# Patient Record
Sex: Female | Born: 1945 | Race: White | Hispanic: No | Smoking: Never smoker
Health system: Southern US, Community
[De-identification: ages and names within clinical notes are randomized; demographics above are authoritative.]

## PROBLEM LIST (undated history)

## (undated) DIAGNOSIS — T7840XA Allergy, unspecified, initial encounter: Secondary | ICD-10-CM

## (undated) DIAGNOSIS — F419 Anxiety disorder, unspecified: Secondary | ICD-10-CM

## (undated) DIAGNOSIS — M1990 Unspecified osteoarthritis, unspecified site: Secondary | ICD-10-CM

## (undated) DIAGNOSIS — E785 Hyperlipidemia, unspecified: Secondary | ICD-10-CM

## (undated) DIAGNOSIS — I10 Essential (primary) hypertension: Secondary | ICD-10-CM

## (undated) DIAGNOSIS — K219 Gastro-esophageal reflux disease without esophagitis: Secondary | ICD-10-CM

## (undated) DIAGNOSIS — M810 Age-related osteoporosis without current pathological fracture: Secondary | ICD-10-CM

## (undated) DIAGNOSIS — H269 Unspecified cataract: Secondary | ICD-10-CM

## (undated) HISTORY — DX: Age-related osteoporosis without current pathological fracture: M81.0

## (undated) HISTORY — DX: Hyperlipidemia, unspecified: E78.5

## (undated) HISTORY — DX: Gastro-esophageal reflux disease without esophagitis: K21.9

## (undated) HISTORY — DX: Essential (primary) hypertension: I10

## (undated) HISTORY — DX: Unspecified cataract: H26.9

## (undated) HISTORY — PX: COLONOSCOPY: SHX174

## (undated) HISTORY — PX: ABDOMINAL HYSTERECTOMY: SHX81

## (undated) HISTORY — PX: EYE SURGERY: SHX253

## (undated) HISTORY — DX: Anxiety disorder, unspecified: F41.9

## (undated) HISTORY — DX: Allergy, unspecified, initial encounter: T78.40XA

## (undated) HISTORY — DX: Unspecified osteoarthritis, unspecified site: M19.90

---

## 1998-01-21 ENCOUNTER — Other Ambulatory Visit: Admission: RE | Admit: 1998-01-21 | Discharge: 1998-01-21 | Payer: Self-pay | Admitting: Family Medicine

## 1998-10-27 ENCOUNTER — Other Ambulatory Visit: Admission: RE | Admit: 1998-10-27 | Discharge: 1998-10-27 | Payer: Self-pay | Admitting: Family Medicine

## 1999-05-08 ENCOUNTER — Encounter: Admission: RE | Admit: 1999-05-08 | Discharge: 1999-05-08 | Payer: Self-pay | Admitting: Family Medicine

## 1999-05-08 ENCOUNTER — Encounter: Payer: Self-pay | Admitting: Family Medicine

## 2000-01-06 ENCOUNTER — Other Ambulatory Visit: Admission: RE | Admit: 2000-01-06 | Discharge: 2000-01-06 | Payer: Self-pay | Admitting: Family Medicine

## 2000-05-13 ENCOUNTER — Encounter: Payer: Self-pay | Admitting: Family Medicine

## 2000-05-13 ENCOUNTER — Encounter: Admission: RE | Admit: 2000-05-13 | Discharge: 2000-05-13 | Payer: Self-pay | Admitting: Family Medicine

## 2001-01-11 ENCOUNTER — Other Ambulatory Visit: Admission: RE | Admit: 2001-01-11 | Discharge: 2001-01-11 | Payer: Self-pay | Admitting: Family Medicine

## 2002-01-31 ENCOUNTER — Other Ambulatory Visit: Admission: RE | Admit: 2002-01-31 | Discharge: 2002-01-31 | Payer: Self-pay | Admitting: Family Medicine

## 2003-04-25 ENCOUNTER — Other Ambulatory Visit: Admission: RE | Admit: 2003-04-25 | Discharge: 2003-04-25 | Payer: Self-pay | Admitting: Family Medicine

## 2004-05-12 ENCOUNTER — Ambulatory Visit: Payer: Self-pay | Admitting: Gastroenterology

## 2004-05-20 ENCOUNTER — Ambulatory Visit: Payer: Self-pay | Admitting: Internal Medicine

## 2004-05-20 ENCOUNTER — Ambulatory Visit (HOSPITAL_COMMUNITY): Admission: RE | Admit: 2004-05-20 | Discharge: 2004-05-20 | Payer: Self-pay | Admitting: Internal Medicine

## 2005-05-11 ENCOUNTER — Other Ambulatory Visit: Admission: RE | Admit: 2005-05-11 | Discharge: 2005-05-11 | Payer: Self-pay | Admitting: Family Medicine

## 2006-07-22 ENCOUNTER — Other Ambulatory Visit: Admission: RE | Admit: 2006-07-22 | Discharge: 2006-07-22 | Payer: Self-pay | Admitting: Family Medicine

## 2007-03-24 ENCOUNTER — Encounter: Admission: RE | Admit: 2007-03-24 | Discharge: 2007-03-24 | Payer: Self-pay | Admitting: Family Medicine

## 2007-08-31 ENCOUNTER — Ambulatory Visit (HOSPITAL_COMMUNITY): Admission: RE | Admit: 2007-08-31 | Discharge: 2007-08-31 | Payer: Self-pay | Admitting: Obstetrics and Gynecology

## 2007-08-31 ENCOUNTER — Encounter (INDEPENDENT_AMBULATORY_CARE_PROVIDER_SITE_OTHER): Payer: Self-pay | Admitting: Obstetrics and Gynecology

## 2007-10-23 ENCOUNTER — Encounter: Payer: Self-pay | Admitting: Internal Medicine

## 2007-10-23 ENCOUNTER — Ambulatory Visit: Payer: Self-pay | Admitting: Internal Medicine

## 2007-10-23 DIAGNOSIS — K625 Hemorrhage of anus and rectum: Secondary | ICD-10-CM

## 2007-11-03 ENCOUNTER — Ambulatory Visit: Payer: Self-pay | Admitting: Internal Medicine

## 2010-11-03 NOTE — Op Note (Signed)
NAME:  Courtney Johnson, Courtney Johnson             ACCOUNT NO.:  [id number]   MEDICAL RECORD NO.:  [id number]          PATIENT TYPE:  AMB   LOCATION:  SDC                           FACILITY:  [facility]   PHYSICIAN:  Guy Sandifer. Henderson Cloud, M.D. DATE OF BIRTH:  1945-08-11   DATE OF PROCEDURE:  08/31/2007  DATE OF DISCHARGE:                               OPERATIVE REPORT   PREOPERATIVE DIAGNOSIS:  Post menopausal bleeding.   POSTOPERATIVE DIAGNOSIS:  Post menopausal bleeding.   PROCEDURE:  Hysteroscopy with resectoscope, dilatation and curettage,  and 1% Xylocaine paracervical block.   SURGEON:  Guy Sandifer. Henderson Cloud, M.D.   ANESTHESIA:  MAC.   SPECIMENS:  1. Endometrial resection.  2. Endometrial curettings, both to pathology.   ESTIMATED BLOOD LOSS:  Minimal.   INPUT AND OUTPUT OF DISTENDING MEDIUM:  90 mL deficit.   INDICATIONS AND CONSENT:  This patient is a 65 year old married white  female G4, P3, with post menopausal bleeding.  Details are dictated in  the history and physical.  Hysteroscopy, resectoscope, dilatation and  curettage have been discussed preoperatively.  The potential risks and  complications have been reviewed including but not limited to infection,  uterine perforation, organ damage, bleeding requiring transfusion of  blood products with possible HIV and hepatitis acquisition, DVT, PE, and  pneumonia.  All questions have been answered and consent signed on the  chart.   FINDINGS:  There is a broad based 8 mm mass arising from the middle of  the upper posterior endometrial canal.  Fallopian tube ostia are noted  bilaterally.   PROCEDURE IN DETAIL:  The patient was taken to the operating room where  she was identified, placed in the dorsal supine position, and she is  given sedation.  She is then placed in the dorsal lithotomy position  where she is gently prepped, the bladder straight catheterized, and  draped in a sterile fashion.  A bivalve speculum was placed in the  vagina and  the anterior cervical lip was injected with 1% Xylocaine and  grasped with a single tooth tenaculum.  A paracervical block was placed  at the 2, 4, 5, 7, 8, and 10 o'clock positions with approximately 20 mL  total of 1% plain Xylocaine.  The cervix was gently progressively  dilated.  The diagnostic hysteroscope was placed in the endocervical  canal and passed under direct visualization under distending media.  The  above findings were noted.  The diagnostic hysteroscope is removed and  the resectoscope with a single right angle wire loop is placed without  difficulty, again advancing under direct visualization with distending  media.  The above described mass was resected in a simple fashion.  Good  hemostasis is maintained.  Resection is carried down to but not to  through the  myometrium.  The hysteroscope is removed and sharp curettage is carried  out.  Scant curettings were noted.  Good hemostasis is noted. All  instruments are removed.  All counts were correct.  The patient is  awakened and taken to the recovery room in stable condition.      Guy Sandifer Henderson Cloud, M.D.  Electronically Signed     JET/MEDQ  D:  08/31/2007  T:  08/31/2007  Job:  [id number]

## 2010-11-03 NOTE — H&P (Signed)
NAME:  Courtney Johnson, Courtney Johnson             ACCOUNT NO.:  [id number]   MEDICAL RECORD NO.:  [id number]          PATIENT TYPE:  AMB   LOCATION:                                FACILITY:  [facility]   PHYSICIAN:  Guy Sandifer. Henderson Cloud, M.D. DATE OF BIRTH:  Mar 17, 1946   DATE OF ADMISSION:  08/31/2007  DATE OF DISCHARGE:                              HISTORY & PHYSICAL   CHIEF COMPLAINT:  Postmenopausal bleeding.   HISTORY OF PRESENT ILLNESS:  This patient is a 65 year old, married,  white female G4, P3 with a last menstrual period at approximately 74-61  years of age.  Over the past several months, she has had intermittent  bright red blood on the toilet tissue.  She has been evaluated by  urology with a reportedly negative CT scan and cystoscopy.  No changes  in bowel habits. Pap smear on June 26, 2007 is benign.  On initial  examination in my office on June 23, 2007, an endocervical polyp was  removed. Pathology is benign.  A subsequent ultrasound on July 25, 2007 was consistent with a uterus measuring 6.3 x 3.0 x 4.2 cm.  Sonohysterogram was consistent with a 7 mm polypoid type mass in the  endometrial cavity.  The patient is being admitted for hysteroscopy with  resectoscope, dilatation and curettage.  The potential risks and  complications have been discussed with the patient preoperatively.   PAST MEDICAL HISTORY:  Positive for hemorrhoids and diverticulitis,  yeast infections   PAST SURGICAL HISTORY:  Tubal ligation 30 years ago.   OBSTETRICAL HISTORY:  Vaginal delivery x3, miscarriage x1.   FAMILY HISTORY:  Positive for diabetes in daughter, colon cancer in  sister.  Coronary artery disease in parents, thyroid disorder in sister.   MEDICATIONS:  None.   ALLERGIES:  No known drug allergies.   SOCIAL HISTORY:  Denies tobacco, alcohol or drug abuse.   REVIEW OF SYSTEMS:  NEURO:  Denies headache. CARDIAC:  Denies chest  pain. PULMONARY:  Denies shortness of breath.   PHYSICAL  EXAMINATION:  HEENT:  Without thyromegaly.  LUNGS:  Clear to auscultation.  HEART:  Regular rate and rhythm.  BACK:  Without CVA tenderness.  BREASTS:  Without mass, traction or discharge.  ABDOMEN:  Soft, nontender without masses.  PELVIC:  Vulva, vagina and cervix without lesion. The uterus is normal  size, mobile, nontender. Adnexa is nontender without masses.  EXTREMITIES:  Grossly within normal limits.  NEUROLOGIC:  Grossly within normal limits.   ASSESSMENT:  Postmenopausal bleeding.   PLAN:  Hysteroscopy with dilatation and curettage.      Guy Sandifer Henderson Cloud, M.D.  Electronically Signed     JET/MEDQ  D:  08/27/2007  T:  08/28/2007  Job:  [id number]

## 2011-03-15 LAB — CBC
HCT: 40.3
Hemoglobin: 14.2
MCHC: 35.2
MCV: 89.7
Platelets: 298
RBC: 4.5
RDW: 12.5
WBC: 4.1

## 2011-03-15 LAB — COMPREHENSIVE METABOLIC PANEL
ALT: 16
Albumin: 4.3
Alkaline Phosphatase: 65
Chloride: 105
Potassium: 3.5
Sodium: 141
Total Bilirubin: 0.8
Total Protein: 7.5

## 2012-09-06 ENCOUNTER — Other Ambulatory Visit: Payer: Self-pay | Admitting: Nurse Practitioner

## 2012-09-07 ENCOUNTER — Other Ambulatory Visit: Payer: Self-pay | Admitting: *Deleted

## 2012-09-07 MED ORDER — SIMVASTATIN 20 MG PO TABS: 20.0000 mg | ORAL_TABLET | Freq: Every day | ORAL | Status: DC

## 2012-10-07 ENCOUNTER — Other Ambulatory Visit: Payer: Self-pay | Admitting: Nurse Practitioner

## 2012-10-09 NOTE — Telephone Encounter (Signed)
LAST OV 10/13. LAST LABS 8/13

## 2012-12-05 ENCOUNTER — Encounter: Payer: Self-pay | Admitting: Internal Medicine

## 2012-12-10 ENCOUNTER — Other Ambulatory Visit: Payer: Self-pay | Admitting: Nurse Practitioner

## 2012-12-12 NOTE — Telephone Encounter (Signed)
Last labs on 02/16/12

## 2013-01-13 ENCOUNTER — Other Ambulatory Visit: Payer: Self-pay | Admitting: Nurse Practitioner

## 2013-01-16 NOTE — Telephone Encounter (Signed)
Medication refill denied- patient needs to be seen for lab work

## 2013-01-16 NOTE — Telephone Encounter (Signed)
Last lipids 08/13

## 2013-01-16 NOTE — Telephone Encounter (Signed)
Patient husband aware that she needs appt to refill rx

## 2013-01-19 ENCOUNTER — Telehealth: Payer: Self-pay | Admitting: Nurse Practitioner

## 2013-01-19 NOTE — Telephone Encounter (Signed)
Appt given for pap/physical exam and refill meds. Per last pap she is not due until next year. May just do labs and refill meds

## 2013-01-24 ENCOUNTER — Encounter: Payer: Self-pay | Admitting: Nurse Practitioner

## 2013-01-24 ENCOUNTER — Ambulatory Visit (INDEPENDENT_AMBULATORY_CARE_PROVIDER_SITE_OTHER): Payer: Medicare Other | Admitting: Nurse Practitioner

## 2013-01-24 VITALS — BP 159/99 | HR 81 | Temp 98.1°F | Ht 63.0 in | Wt 128.0 lb

## 2013-01-24 DIAGNOSIS — E785 Hyperlipidemia, unspecified: Secondary | ICD-10-CM

## 2013-01-24 DIAGNOSIS — Z124 Encounter for screening for malignant neoplasm of cervix: Secondary | ICD-10-CM

## 2013-01-24 DIAGNOSIS — Z01419 Encounter for gynecological examination (general) (routine) without abnormal findings: Secondary | ICD-10-CM

## 2013-01-24 DIAGNOSIS — F411 Generalized anxiety disorder: Secondary | ICD-10-CM

## 2013-01-24 DIAGNOSIS — Z0000 Encounter for general adult medical examination without abnormal findings: Secondary | ICD-10-CM

## 2013-01-24 LAB — POCT URINALYSIS DIPSTICK
Protein, UA: NEGATIVE
Urobilinogen, UA: NEGATIVE
pH, UA: 7

## 2013-01-24 LAB — POCT CBC
HCT, POC: 40.9 % (ref 37.7–47.9)
Hemoglobin: 13.6 g/dL (ref 12.2–16.2)
MCHC: 33.4 g/dL (ref 31.8–35.4)
MCV: 89.5 fL (ref 80–97)
POC Granulocyte: 2.9 (ref 2–6.9)

## 2013-01-24 LAB — POCT UA - MICROSCOPIC ONLY
Casts, Ur, LPF, POC: NEGATIVE
Crystals, Ur, HPF, POC: NEGATIVE
Yeast, UA: NEGATIVE

## 2013-01-24 MED ORDER — SIMVASTATIN 20 MG PO TABS: 20.0000 mg | ORAL_TABLET | Freq: Every day | ORAL | Status: DC

## 2013-01-24 MED ORDER — ALPRAZOLAM 0.25 MG PO TABS: 0.2500 mg | ORAL_TABLET | Freq: Two times a day (BID) | ORAL | Status: DC | PRN

## 2013-01-24 NOTE — Patient Instructions (Signed)

## 2013-01-24 NOTE — Progress Notes (Signed)
Subjective:    Patient ID: Courtney Johnson, female    DOB: 02/06/1946, 67 y.o.   MRN: [id number] Patient here today for CPE and PAP- SHe is doing well Hyperlipidemia This is a chronic problem. The current episode started more than 1 year ago. The problem is controlled. Recent lipid tests were reviewed and are normal. She has no history of diabetes, hypothyroidism or obesity. There are no known factors aggravating her hyperlipidemia. Pertinent negatives include no chest pain, focal sensory loss, leg pain, myalgias or shortness of breath. Current antihyperlipidemic treatment includes statins. The current treatment provides significant improvement of lipids. There are no compliance problems.  Risk factors for coronary artery disease include post-menopausal.       Review of Systems  Respiratory: Negative for shortness of breath.   Cardiovascular: Negative for chest pain.  Musculoskeletal: Negative for myalgias.  All other systems reviewed and are negative.       Objective:   Physical Exam  Constitutional: She is oriented to person, place, and time. She appears well-developed and well-nourished.  HENT:  Head: Normocephalic.  Right Ear: Hearing, tympanic membrane, external ear and ear canal normal.  Left Ear: Hearing, tympanic membrane, external ear and ear canal normal.  Nose: Nose normal.  Mouth/Throat: Uvula is midline and oropharynx is clear and moist.  Eyes: Conjunctivae and EOM are normal. Pupils are equal, round, and reactive to light.  Neck: Normal range of motion and full passive range of motion without pain. Neck supple. No JVD present. Carotid bruit is not present. No mass and no thyromegaly present.  Cardiovascular: Normal rate, normal heart sounds and intact distal pulses.   No murmur heard. Pulmonary/Chest: Effort normal and breath sounds normal. Right breast exhibits no inverted nipple, no mass, no nipple discharge, no skin change and no tenderness. Left breast exhibits no  inverted nipple, no mass, no nipple discharge, no skin change and no tenderness.  Abdominal: Soft. Bowel sounds are normal. She exhibits no mass. There is no tenderness.  Genitourinary: Vagina normal. Guaiac positive stool. No breast swelling, tenderness, discharge or bleeding. No vaginal discharge found.  bimanual exam-No adnexal masses or tenderness.  Vaginal cuff intact  Musculoskeletal: Normal range of motion.  Lymphadenopathy:    She has no cervical adenopathy.  Neurological: She is alert and oriented to person, place, and time.  Skin: Skin is warm and dry.  Psychiatric: She has a normal mood and affect. Her behavior is normal. Judgment and thought content normal.     BP 159/99  Pulse 81  Temp(Src) 98.1 F (36.7 C) (Oral)  Ht 5\' 3"  (1.6 m)  Wt 128 lb (58.06 kg)  BMI 22.68 kg/m2 Results for orders placed in visit on 01/24/13  POCT UA - MICROSCOPIC ONLY      Result Value Range   WBC, Ur, HPF, POC 1-5     RBC, urine, microscopic 5-7     Bacteria, U Microscopic neg     Mucus, UA neg     Epithelial cells, urine per micros occ     Crystals, Ur, HPF, POC neg     Casts, Ur, LPF, POC neg     Yeast, UA neg    POCT URINALYSIS DIPSTICK      Result Value Range   Color, UA yellow     Clarity, UA clear     Glucose, UA neg     Bilirubin, UA neg     Ketones, UA neg     Spec Grav, UA <=1.005  Blood, UA trace     pH, UA 7.0     Protein, UA neg     Urobilinogen, UA negative     Nitrite, UA nrg     Leukocytes, UA Trace          Assessment & Plan:  1. Encounter for routine gynecological examination  - Pap IG (Image Guided)  2. Annual physical exam  - POCT UA - Microscopic Only - POCT urinalysis dipstick - POCT CBC - CMP14+EGFR - Thyroid Panel With TSH  3. Hyperlipidemia Low fat diet and exercise - NMR, lipoprofile - simvastatin (ZOCOR) 20 MG tablet; Take 1 tablet (20 mg total) by mouth at bedtime.  Dispense: 90 tablet; Refill: 1  4. GAD (generalized anxiety  disorder) Stress management - ALPRAZolam (XANAX) 0.25 MG tablet; Take 1 tablet (0.25 mg total) by mouth 2 (two) times daily as needed for sleep.  Dispense: 60 tablet; Refill: 0  [name], FNP

## 2013-01-25 LAB — CMP14+EGFR
ALT: 24 IU/L (ref 0–32)
Albumin/Globulin Ratio: 1.7 (ref 1.1–2.5)
Albumin: 4.4 g/dL (ref 3.6–4.8)
BUN/Creatinine Ratio: 16 (ref 11–26)
GFR calc Af Amer: 105 mL/min/{1.73_m2} (ref >59)
GFR calc non Af Amer: 91 mL/min/{1.73_m2} (ref >59)
Glucose: 96 mg/dL (ref 65–99)
Potassium: 4.3 mmol/L (ref 3.5–5.2)
Total Bilirubin: 0.3 mg/dL (ref 0.0–1.2)
Total Protein: 7 g/dL (ref 6.0–8.5)

## 2013-01-25 LAB — NMR, LIPOPROFILE
Cholesterol: 226 mg/dL — ABNORMAL HIGH (ref <200)
LDL Size: 20.2 nm — ABNORMAL LOW (ref >20.5)
Small LDL Particle Number: 1003 nmol/L — ABNORMAL HIGH (ref <=527)
Triglycerides by NMR: 152 mg/dL — ABNORMAL HIGH (ref <150)

## 2013-01-26 ENCOUNTER — Other Ambulatory Visit: Payer: Self-pay | Admitting: Nurse Practitioner

## 2013-01-26 MED ORDER — SIMVASTATIN 40 MG PO TABS: 40.0000 mg | ORAL_TABLET | Freq: Every day | ORAL | Status: DC

## 2013-01-26 MED ORDER — ATORVASTATIN CALCIUM 40 MG PO TABS: 40.0000 mg | ORAL_TABLET | Freq: Every day | ORAL | Status: DC

## 2013-01-28 LAB — PAP IG (IMAGE GUIDED): PAP Smear Comment: 0

## 2013-02-05 ENCOUNTER — Telehealth: Payer: Self-pay | Admitting: Nurse Practitioner

## 2013-02-05 MED ORDER — NITROFURANTOIN MONOHYD MACRO 100 MG PO CAPS: 100.0000 mg | ORAL_CAPSULE | Freq: Two times a day (BID) | ORAL | Status: DC

## 2013-02-05 NOTE — Telephone Encounter (Signed)
macrobid rx sent to pharmacy 

## 2013-02-05 NOTE — Telephone Encounter (Signed)
Patient aware.

## 2013-02-12 ENCOUNTER — Encounter: Payer: Self-pay | Admitting: Internal Medicine

## 2013-03-28 ENCOUNTER — Encounter (INDEPENDENT_AMBULATORY_CARE_PROVIDER_SITE_OTHER): Payer: Self-pay

## 2013-03-28 ENCOUNTER — Ambulatory Visit (INDEPENDENT_AMBULATORY_CARE_PROVIDER_SITE_OTHER): Payer: Medicare Other

## 2013-03-28 DIAGNOSIS — Z23 Encounter for immunization: Secondary | ICD-10-CM

## 2013-04-16 ENCOUNTER — Ambulatory Visit (AMBULATORY_SURGERY_CENTER): Payer: Self-pay

## 2013-04-16 VITALS — Ht 63.0 in | Wt 125.0 lb

## 2013-04-16 DIAGNOSIS — Z800 Family history of malignant neoplasm of digestive organs: Secondary | ICD-10-CM

## 2013-04-16 MED ORDER — SUPREP BOWEL PREP KIT 17.5-3.13-1.6 GM/177ML PO SOLN: 1.0000 | Freq: Once | ORAL | Status: DC

## 2013-04-26 ENCOUNTER — Encounter: Payer: Self-pay | Admitting: Internal Medicine

## 2013-04-26 ENCOUNTER — Ambulatory Visit (AMBULATORY_SURGERY_CENTER): Payer: Medicare Other | Admitting: Internal Medicine

## 2013-04-26 VITALS — BP 103/66 | HR 68 | Temp 97.9°F | Resp 15 | Ht 63.0 in | Wt 125.0 lb

## 2013-04-26 DIAGNOSIS — Z800 Family history of malignant neoplasm of digestive organs: Secondary | ICD-10-CM | POA: Insufficient documentation

## 2013-04-26 DIAGNOSIS — Z1211 Encounter for screening for malignant neoplasm of colon: Secondary | ICD-10-CM

## 2013-04-26 MED ORDER — SODIUM CHLORIDE 0.9 % IV SOLN: 500.0000 mL | INTRAVENOUS | Status: DC

## 2013-04-26 NOTE — Progress Notes (Signed)
Lidocaine-40mg IV prior to Propofol InductionPropofol given over incremental dosages 

## 2013-04-26 NOTE — Op Note (Signed)
[facility] [address] [place], [address]   COLONOSCOPY PROCEDURE REPORT  PATIENT: Courtney Johnson, Courtney Johnson  MR#: [id number] BIRTHDATE: [birth year] , 66  yrs. old GENDER: Female ENDOSCOPIST: Iva Boop, MD, [facility] PROCEDURE DATE:  04/26/2013 PROCEDURE:   Colonoscopy, screening First Screening Colonoscopy - Avg.  risk and is 50 yrs.  old or older - No.  Prior Negative Screening - Now for repeat screening. Above average risk  History of Adenoma - Now for follow-up colonoscopy & has been > or = to 3 yrs.  N/A  Polyps Removed Today? No.  Recommend repeat exam, <10 yrs? Yes.  High risk (family or personal hx). ASA CLASS:   Class II INDICATIONS:Patient's immediate family history of colon cancer. Sister late 43's MEDICATIONS: propofol (Diprivan) 200mg  IV, MAC sedation, administered by CRNA, and These medications were titrated to patient response per physician's verbal order  DESCRIPTION OF PROCEDURE:   After the risks benefits and alternatives of the procedure were thoroughly explained, informed consent was obtained.  A digital rectal exam revealed no abnormalities of the rectum but she has nal skin tags.   The LB CF-H180AL Loaner [id number]  endoscope was introduced through the anus and advanced to the cecum, which was identified by both the appendix and ileocecal valve. No adverse events experienced.   The quality of the prep was excellent using Suprep  The instrument was then slowly withdrawn as the colon was fully examined.   COLON FINDINGS: Moderate diverticulosis was noted in the sigmoid colon.   The colon mucosa was otherwise normal.   A right colon retroflexion was performed.  Retroflexed views revealed internal hemorrhoids. The time to cecum=3 minutes 57 seconds.  Withdrawal time=8 minutes 27 seconds.  The scope was withdrawn and the procedure completed. COMPLICATIONS: There were no complications.  ENDOSCOPIC IMPRESSION: 1.   Moderate diverticulosis was noted  in the sigmoid colon 2.   The colon mucosa was otherwise normal - excellent prep - family hx CRCA 3.   Internal hemorrhoids 4.   Hemorrhoidal tags  RECOMMENDATIONS: 1.  Repeat Colonoscopy in 5 years. 2.   My office will contact patient to schedule a visit about hemorrhoid problems and gas.  eSigned:  Iva Boop, MD, [facility] 04/26/2013 9:28 AM  cc: The Patient

## 2013-04-26 NOTE — Progress Notes (Signed)
Patient did not experience any of the following events: a burn prior to discharge; a fall within the facility; wrong site/side/patient/procedure/implant event; or a hospital transfer or hospital admission upon discharge from the facility. (G8907) Patient did not have preoperative order for IV antibiotic SSI prophylaxis. (G8918)  

## 2013-04-26 NOTE — Patient Instructions (Addendum)
No polyps seen. You also have a condition called diverticulosis - common and not usually a problem. Please read the handout provided. You have skin tags and hemorrhoids (internal).  Next routine colonoscopy in 5 years - 2019.  My office will make you a follow-up appointment. Please read the hemorrhoid handout.  I appreciate the opportunity to care for you. Iva Boop, MD, FACG  YOU HAD AN ENDOSCOPIC PROCEDURE TODAY AT THE [facility]: Refer to the procedure report that was given to you for any specific questions about what was found during the examination.  If the procedure report does not answer your questions, please call your gastroenterologist to clarify.  If you requested that your care partner not be given the details of your procedure findings, then the procedure report has been included in a sealed envelope for you to review at your convenience later.  YOU SHOULD EXPECT: Some feelings of bloating in the abdomen. Passage of more gas than usual.  Walking can help get rid of the air that was put into your GI tract during the procedure and reduce the bloating. If you had a lower endoscopy (such as a colonoscopy or flexible sigmoidoscopy) you may notice spotting of blood in your stool or on the toilet paper. If you underwent a bowel prep for your procedure, then you may not have a normal bowel movement for a few days.  DIET: Your first meal following the procedure should be a light meal and then it is ok to progress to your normal diet.  A half-sandwich or bowl of soup is an example of a good first meal.  Heavy or fried foods are harder to digest and may make you feel nauseous or bloated.  Likewise meals heavy in dairy and vegetables can cause extra gas to form and this can also increase the bloating.  Drink plenty of fluids but you should avoid alcoholic beverages for 24 hours.  ACTIVITY: Your care partner should take you home directly after the procedure.  You should plan to  take it easy, moving slowly for the rest of the day.  You can resume normal activity the day after the procedure however you should NOT DRIVE or use heavy machinery for 24 hours (because of the sedation medicines used during the test).    SYMPTOMS TO REPORT IMMEDIATELY: A gastroenterologist can be reached at any hour.  During normal business hours, 8:30 AM to 5:00 PM Monday through Friday, call [phone].  After hours and on weekends, please call the GI answering service at [phone] who will take a message and have the physician on call contact you.   Following lower endoscopy (colonoscopy or flexible sigmoidoscopy):  Excessive amounts of blood in the stool  Significant tenderness or worsening of abdominal pains  Swelling of the abdomen that is new, acute  Fever of 100F or higher   FOLLOW UP: If any biopsies were taken you will be contacted by phone or by letter within the next 1-3 weeks.  Call your gastroenterologist if you have not heard about the biopsies in 3 weeks.  Our staff will call the home number listed on your records the next business day following your procedure to check on you and address any questions or concerns that you may have at that time regarding the information given to you following your procedure. This is a courtesy call and so if there is no answer at the home number and we have not heard from you through the  emergency physician on call, we will assume that you have returned to your regular daily activities without incident.  SIGNATURES/CONFIDENTIALITY: You and/or your care partner have signed paperwork which will be entered into your electronic medical record.  These signatures attest to the fact that that the information above on your After Visit Summary has been reviewed and is understood.  Full responsibility of the confidentiality of this discharge information lies with you and/or your care-partner.  Diverticulosis, hemorrhoids-handouts given  Repeat  colonoscopy will be determined by pathology.

## 2013-04-27 ENCOUNTER — Telehealth: Payer: Self-pay | Admitting: *Deleted

## 2013-04-27 NOTE — Telephone Encounter (Signed)
  Follow up Call-  Call back number 04/26/2013  Post procedure Call Back phone  # [phone]  Permission to leave phone message Yes     Patient questions:  Do you have a fever, pain , or abdominal swelling? no Pain Score  0 *  Have you tolerated food without any problems? yes  Have you been able to return to your normal activities? yes  Do you have any questions about your discharge instructions: Diet   no Medications  no Follow up visit  no  Do you have questions or concerns about your Care? no  Actions: * If pain score is 4 or above: No action needed, pain <4.

## 2013-06-13 ENCOUNTER — Ambulatory Visit (INDEPENDENT_AMBULATORY_CARE_PROVIDER_SITE_OTHER): Payer: Medicare Other | Admitting: Family Medicine

## 2013-06-13 ENCOUNTER — Encounter: Payer: Self-pay | Admitting: Family Medicine

## 2013-06-13 ENCOUNTER — Encounter (INDEPENDENT_AMBULATORY_CARE_PROVIDER_SITE_OTHER): Payer: Self-pay

## 2013-06-13 VITALS — BP 144/92 | HR 94 | Temp 99.8°F | Ht 63.0 in | Wt 124.0 lb

## 2013-06-13 DIAGNOSIS — N390 Urinary tract infection, site not specified: Secondary | ICD-10-CM

## 2013-06-13 LAB — POCT URINALYSIS DIPSTICK
Nitrite, UA: NEGATIVE
Urobilinogen, UA: NEGATIVE
pH, UA: 7.5

## 2013-06-13 LAB — POCT UA - MICROSCOPIC ONLY
Casts, Ur, LPF, POC: NEGATIVE
Mucus, UA: NEGATIVE
Yeast, UA: NEGATIVE

## 2013-06-13 MED ORDER — CEPHALEXIN 500 MG PO CAPS: 500.0000 mg | ORAL_CAPSULE | Freq: Three times a day (TID) | ORAL | Status: DC

## 2013-06-13 NOTE — Progress Notes (Signed)
   Subjective:    Patient ID: Courtney Johnson, female    DOB: 1945-08-19, 67 y.o.   MRN: [id number]  HPI DYSURIA Onset:  4-5 days  Description: dysuria, increased urinary frequency  Modifying factors: hx/o recurrent UTI  Symptoms Urgency:  yes Frequency: yes  Hesitancy:  yes Hematuria:  no Flank Pain:  no Fever: no Nausea/Vomiting:  no Missed LMP: no STD exposure: no Discharge: no Irritants: nbo Rash: no  Red Flags   More than 3 UTI's last 12 months:  yes PMH of  Diabetes or Immunosuppression:  no Renal Disease/Calculi: no Urinary Tract Abnormality:  no Instrumentation or Trauma: no      Review of Systems  All other systems reviewed and are negative.       Objective:   Physical Exam  Constitutional: She appears well-developed and well-nourished.  HENT:  Head: Normocephalic and atraumatic.  Eyes: Conjunctivae are normal. Pupils are equal, round, and reactive to light.  Neck: Normal range of motion.  Cardiovascular: Normal rate and regular rhythm.   Pulmonary/Chest: Effort normal and breath sounds normal.  Abdominal: Soft. Bowel sounds are normal.  No flank pain  + suprapubic tenderness   Musculoskeletal: Normal range of motion.  Neurological: She is alert.  Skin: Skin is warm.   Results for orders placed in visit on 06/13/13 (from the past 24 hour(s))  POCT UA - MICROSCOPIC ONLY     Status: None   Collection Time    06/13/13 12:14 PM      Result Value Range   WBC, Ur, HPF, POC occ     RBC, urine, microscopic 1-3     Bacteria, U Microscopic neg     Mucus, UA neg     Epithelial cells, urine per micros occ     Crystals, Ur, HPF, POC neg     Casts, Ur, LPF, POC neg     Yeast, UA neg    POCT URINALYSIS DIPSTICK     Status: None   Collection Time    06/13/13 12:14 PM      Result Value Range   Color, UA clear     Clarity, UA clear     Glucose, UA neg     Bilirubin, UA neg     Ketones, UA neg     Spec Grav, UA <=1.005     Blood, UA trace     pH, UA 7.5     Protein, UA neg     Urobilinogen, UA negative     Nitrite, UA neg     Leukocytes, UA moderate (2+)         Assessment & Plan:  UTI (urinary tract infection) - Plan: POCT UA - Microscopic Only, POCT urinalysis dipstick, Urine culture, cephALEXin (KEFLEX) 500 MG capsule  Will treat with keflex.  Urine culture Discussed infectious and GU red flags  Consider follow up with urology as this has been a recurrent issue.

## 2013-06-18 ENCOUNTER — Other Ambulatory Visit (INDEPENDENT_AMBULATORY_CARE_PROVIDER_SITE_OTHER): Payer: Medicare Other

## 2013-06-18 DIAGNOSIS — E559 Vitamin D deficiency, unspecified: Secondary | ICD-10-CM

## 2013-06-18 DIAGNOSIS — E785 Hyperlipidemia, unspecified: Secondary | ICD-10-CM

## 2013-06-18 NOTE — Progress Notes (Signed)
Pt came in for labs only 

## 2013-06-19 LAB — NMR, LIPOPROFILE
HDL Particle Number: 40.1 umol/L (ref >=30.5)
LDL Size: 20.3 nm — ABNORMAL LOW (ref >20.5)
LDLC SERPL CALC-MCNC: 70 mg/dL (ref <100)
LP-IR Score: 49 — ABNORMAL HIGH (ref <=45)
Small LDL Particle Number: 512 nmol/L (ref <=527)

## 2013-06-19 LAB — CMP14+EGFR
AST: 26 IU/L (ref 0–40)
Alkaline Phosphatase: 63 IU/L (ref 39–117)
BUN/Creatinine Ratio: 10 — ABNORMAL LOW (ref 11–26)
CO2: 23 mmol/L (ref 18–29)
Creatinine, Ser: 0.81 mg/dL (ref 0.57–1.00)
Globulin, Total: 2.3 g/dL (ref 1.5–4.5)
Sodium: 141 mmol/L (ref 134–144)
Total Bilirubin: 0.3 mg/dL (ref 0.0–1.2)

## 2013-06-19 LAB — VITAMIN D 25 HYDROXY (VIT D DEFICIENCY, FRACTURES): Vit D, 25-Hydroxy: 41.9 ng/mL (ref 30.0–100.0)

## 2013-06-23 ENCOUNTER — Ambulatory Visit (INDEPENDENT_AMBULATORY_CARE_PROVIDER_SITE_OTHER): Payer: Medicare HMO | Admitting: Family Medicine

## 2013-06-23 ENCOUNTER — Encounter: Payer: Self-pay | Admitting: Family Medicine

## 2013-06-23 VITALS — BP 149/88 | HR 82 | Temp 98.9°F | Ht 63.0 in | Wt 126.0 lb

## 2013-06-23 DIAGNOSIS — N390 Urinary tract infection, site not specified: Secondary | ICD-10-CM

## 2013-06-23 DIAGNOSIS — E785 Hyperlipidemia, unspecified: Secondary | ICD-10-CM | POA: Insufficient documentation

## 2013-06-23 DIAGNOSIS — R300 Dysuria: Secondary | ICD-10-CM

## 2013-06-23 DIAGNOSIS — F411 Generalized anxiety disorder: Secondary | ICD-10-CM | POA: Insufficient documentation

## 2013-06-23 LAB — POCT URINALYSIS DIPSTICK
BILIRUBIN UA: NEGATIVE
Glucose, UA: NEGATIVE
KETONES UA: NEGATIVE
Nitrite, UA: NEGATIVE
PH UA: 6
Protein, UA: NEGATIVE
Urobilinogen, UA: NEGATIVE

## 2013-06-23 LAB — POCT UA - MICROSCOPIC ONLY
CRYSTALS, UR, HPF, POC: NEGATIVE
Casts, Ur, LPF, POC: NEGATIVE
MUCUS UA: NEGATIVE
WBC, UR, HPF, POC: 5.1
Yeast, UA: NEGATIVE

## 2013-06-23 MED ORDER — PHENAZOPYRIDINE HCL 200 MG PO TABS: 200.0000 mg | ORAL_TABLET | Freq: Three times a day (TID) | ORAL | Status: DC | PRN

## 2013-06-23 MED ORDER — CIPROFLOXACIN HCL 500 MG PO TABS: 500.0000 mg | ORAL_TABLET | Freq: Two times a day (BID) | ORAL | Status: DC

## 2013-06-23 NOTE — Progress Notes (Signed)
Subjective:    Patient ID: Courtney Johnson, female    DOB: Feb 18, 1946, 68 y.o.   MRN: [id number]  HPI 10 day history of pelvic discomfort and dysuria. She finished Keflex 3 days ago. Symptoms improved initially but have not resolved.    Review of Systems  Constitutional: Negative.   HENT: Negative.   Eyes: Negative.   Respiratory: Negative.   Cardiovascular: Negative.   Gastrointestinal: Negative.   Endocrine: Negative.   Genitourinary: Positive for dysuria, urgency, frequency and pelvic pain. Negative for flank pain and difficulty urinating.  Musculoskeletal: Negative.   Skin: Negative.   Allergic/Immunologic: Negative.   Neurological: Negative.   Hematological: Negative.   Psychiatric/Behavioral: Negative.        Objective:   Physical Exam  Constitutional: She is oriented to person, place, and time. She appears well-developed and well-nourished. No distress.  HENT:  Head: Normocephalic.  Eyes: Conjunctivae and EOM are normal. Pupils are equal, round, and reactive to light. Right eye exhibits no discharge. Left eye exhibits no discharge. No scleral icterus.  Neck: Normal range of motion.  Abdominal: Soft. She exhibits no mass. There is tenderness (suprapubic and bilateral flank tenderness). There is no rebound and no guarding.  Musculoskeletal: Normal range of motion.  Neurological: She is alert and oriented to person, place, and time. She has normal reflexes.  Skin: Skin is warm and dry. No rash noted.  Psychiatric: She has a normal mood and affect. Her behavior is normal. Judgment and thought content normal.   Results for orders placed in visit on 06/23/13  POCT UA - MICROSCOPIC ONLY      Result Value Range   WBC, Ur, HPF, POC 5.10     RBC, urine, microscopic 1-5     Bacteria, U Microscopic few     Mucus, UA negative     Epithelial cells, urine per micros few     Crystals, Ur, HPF, POC negative     Casts, Ur, LPF, POC negative     Yeast, UA negative'    POCT  URINALYSIS DIPSTICK      Result Value Range   Color, UA gold     Clarity, UA clear     Glucose, UA negative     Bilirubin, UA negative     Ketones, UA negative     Spec Grav, UA <=1.005     Blood, UA moderate     pH, UA 6.0     Protein, UA negative     Urobilinogen, UA negative     Nitrite, UA negative     Leukocytes, UA small (1+)            Assessment & Plan:   1. Urinary tract infection, site not specifie - POCT UA - Microscopic Only - POCT urinalysis dipstick - Urine culture - ciprofloxacin (CIPRO) 500 MG tablet; Take 1 tablet (500 mg total) by mouth 2 (two) times daily.  Dispense: 20 tablet; Refill: 0 - phenazopyridine (PYRIDIUM) 200 MG tablet; Take 1 tablet (200 mg total) by mouth 3 (three) times daily as needed for pain.  Dispense: 21 tablet; Refill: 1  2. Hyperlipidemia   3. Generalized anxiety disorder   4. Dysuria  - phenazopyridine (PYRIDIUM) 200 MG tablet; Take 1 tablet (200 mg total) by mouth 3 (three) times daily as needed for pain.  Dispense: 21 tablet; Refill: 1  Patient Instructions  Take medication as directed Drink plenty of fluids We will arrange for you a visit with the urologist to further  evaluate the increased frequency of urinary tract infections and symptoms   we will call you with the results of the urine culture once those results are available Nyra Capes MD

## 2013-06-23 NOTE — Addendum Note (Signed)
Addended by: Gwenith DailyHUDY, KRISTEN N on: 06/23/2013 10:07 AM   Modules accepted: Orders

## 2013-06-23 NOTE — Patient Instructions (Signed)
Take medication as directed Drink plenty of fluids We will arrange for you a visit with the urologist to further evaluate the increased frequency of urinary tract infections and symptoms

## 2013-06-26 LAB — URINE CULTURE

## 2013-06-26 LAB — SPECIMEN STATUS REPORT

## 2013-06-29 ENCOUNTER — Telehealth: Payer: Self-pay | Admitting: Family Medicine

## 2013-07-03 ENCOUNTER — Other Ambulatory Visit: Payer: Self-pay | Admitting: *Deleted

## 2013-07-03 ENCOUNTER — Other Ambulatory Visit: Payer: Medicare HMO

## 2013-07-03 DIAGNOSIS — N390 Urinary tract infection, site not specified: Secondary | ICD-10-CM

## 2013-07-03 NOTE — Progress Notes (Signed)
Patient dropped off urine sample

## 2013-07-04 LAB — URINE CULTURE

## 2013-07-05 ENCOUNTER — Telehealth: Payer: Self-pay | Admitting: Family Medicine

## 2013-10-07 ENCOUNTER — Other Ambulatory Visit: Payer: Self-pay | Admitting: Nurse Practitioner

## 2014-01-11 ENCOUNTER — Other Ambulatory Visit: Payer: Self-pay | Admitting: Nurse Practitioner

## 2014-01-30 ENCOUNTER — Encounter: Payer: Self-pay | Admitting: Nurse Practitioner

## 2014-01-30 ENCOUNTER — Ambulatory Visit (INDEPENDENT_AMBULATORY_CARE_PROVIDER_SITE_OTHER): Payer: Medicare HMO | Admitting: Nurse Practitioner

## 2014-01-30 VITALS — BP 147/94 | HR 96 | Temp 97.1°F | Ht 63.0 in | Wt 126.6 lb

## 2014-01-30 DIAGNOSIS — F411 Generalized anxiety disorder: Secondary | ICD-10-CM

## 2014-01-30 DIAGNOSIS — Z0000 Encounter for general adult medical examination without abnormal findings: Secondary | ICD-10-CM

## 2014-01-30 DIAGNOSIS — Z13820 Encounter for screening for osteoporosis: Secondary | ICD-10-CM

## 2014-01-30 DIAGNOSIS — E785 Hyperlipidemia, unspecified: Secondary | ICD-10-CM

## 2014-01-30 LAB — POCT URINALYSIS DIPSTICK
BILIRUBIN UA: NEGATIVE
Glucose, UA: NEGATIVE
Ketones, UA: NEGATIVE
Nitrite, UA: NEGATIVE
Protein, UA: NEGATIVE
UROBILINOGEN UA: NEGATIVE
pH, UA: 6.5

## 2014-01-30 LAB — POCT UA - MICROSCOPIC ONLY
CRYSTALS, UR, HPF, POC: NEGATIVE
Casts, Ur, LPF, POC: NEGATIVE
Mucus, UA: NEGATIVE
Yeast, UA: NEGATIVE

## 2014-01-30 MED ORDER — ALPRAZOLAM 0.25 MG PO TABS: 0.2500 mg | ORAL_TABLET | Freq: Two times a day (BID) | ORAL | Status: DC | PRN

## 2014-01-30 NOTE — Progress Notes (Signed)
Subjective:    Patient ID: Courtney Johnson, female    DOB: [birth year], 68 y.o.   MRN: [id number]  Patient here today for follow up of chronic medical problems.   Hyperlipidemia This is a chronic problem. The current episode started more than 1 year ago. The problem is controlled. Recent lipid tests were reviewed and are normal. She has no history of diabetes, hypothyroidism or obesity. There are no known factors aggravating her hyperlipidemia. Pertinent negatives include no chest pain, focal sensory loss, leg pain, myalgias or shortness of breath. Current antihyperlipidemic treatment includes statins (Stopped takin zocor because she thoughtit was making her so tired.). The current treatment provides significant improvement of lipids. There are no compliance problems.  Risk factors for coronary artery disease include post-menopausal.   GAD Only takes xanax when she is worried and cannot sleep-     * patient c/o dysuria that just started this morning- she has frequent UTI and wants urine checked.   Review of Systems  Respiratory: Negative for shortness of breath.   Cardiovascular: Negative for chest pain.  Musculoskeletal: Negative for myalgias.  All other systems reviewed and are negative.      Objective:   Physical Exam  Constitutional: She is oriented to person, place, and time. She appears well-developed and well-nourished.  HENT:  Head: Normocephalic.  Right Ear: Hearing, tympanic membrane, external ear and ear canal normal.  Left Ear: Hearing, tympanic membrane, external ear and ear canal normal.  Nose: Nose normal.  Mouth/Throat: Uvula is midline and oropharynx is clear and moist.  Eyes: Conjunctivae and EOM are normal. Pupils are equal, round, and reactive to light.  Neck: Trachea normal, normal range of motion and full passive range of motion without pain. Neck supple. No JVD present. Carotid bruit is not present. No mass and no thyromegaly present.  Cardiovascular: Normal  rate, regular rhythm, normal heart sounds and intact distal pulses.  Exam reveals no gallop and no friction rub.   No murmur heard. Pulmonary/Chest: Effort normal and breath sounds normal. Right breast exhibits no inverted nipple, no mass, no nipple discharge, no skin change and no tenderness. Left breast exhibits no inverted nipple, no mass, no nipple discharge, no skin change and no tenderness.  Abdominal: Soft. Bowel sounds are normal. She exhibits no distension and no mass. There is no tenderness.  Genitourinary: No breast swelling, tenderness, discharge or bleeding. No vaginal discharge found.  Musculoskeletal: Normal range of motion.  Lymphadenopathy:    She has no cervical adenopathy.  Neurological: She is alert and oriented to person, place, and time. She has normal reflexes.  Skin: Skin is warm and dry.  Psychiatric: She has a normal mood and affect. Her behavior is normal. Judgment and thought content normal.     BP 147/94  Pulse 96  Temp(Src) 97.1 F (36.2 C) (Oral)  Ht 5' 3"  (1.6 m)  Wt 126 lb 9.6 oz (57.425 kg)  BMI 22.43 kg/m2   Results for orders placed in visit on 01/30/14  POCT URINALYSIS DIPSTICK      Result Value Ref Range   Color, UA gold     Clarity, UA clear     Glucose, UA negative     Bilirubin, UA negative     Ketones, UA negative     Spec Grav, UA <=1.005     Blood, UA trace     pH, UA 6.5     Protein, UA negative     Urobilinogen, UA negative  Nitrite, UA negative     Leukocytes, UA small (1+)    POCT UA - MICROSCOPIC ONLY      Result Value Ref Range   WBC, Ur, HPF, POC 5-8     RBC, urine, microscopic 1-5     Bacteria, U Microscopic few     Mucus, UA negative     Epithelial cells, urine per micros occ     Crystals, Ur, HPF, POC negative     Casts, Ur, LPF, POC negative     Yeast, UA negative         Assessment & Plan:   1. Routine health maintenance   2. Hyperlipidemia   3. Generalized anxiety disorder   4. GAD (generalized anxiety  disorder)   5. Osteoporosis screening    Orders Placed This Encounter  Procedures  . DG Bone Density    Standing Status: Future     Number of Occurrences:      Standing Expiration Date: 04/01/2015    Order Specific Question:  Reason for Exam (SYMPTOM  OR DIAGNOSIS REQUIRED)    Answer:  screening    Order Specific Question:  Preferred imaging location?    Answer:  Internal  . CMP14+EGFR  . NMR, lipoprofile  . POCT urinalysis dipstick  . POCT UA - Microscopic Only   Meds ordered this encounter  Medications  . Vitamins A & D 5000-400 UNITS CAPS    Sig:   . ALPRAZolam (XANAX) 0.25 MG tablet    Sig: Take 1 tablet (0.25 mg total) by mouth 2 (two) times daily as needed for sleep.    Dispense:  60 tablet    Refill:  1    Order Specific Question:  Supervising Provider    Answer:  Chipper Herb [[id number]]    Labs pending Health maintenance reviewed Diet and exercise encouraged Continue all meds Follow up  In 3 month   Volin, FNP

## 2014-01-30 NOTE — Patient Instructions (Signed)
Fat and Cholesterol Control Diet Fat and cholesterol levels in your blood and organs are influenced by your diet. High levels of fat and cholesterol may lead to diseases of the heart, small and large blood vessels, gallbladder, liver, and pancreas. CONTROLLING FAT AND CHOLESTEROL WITH DIET Although exercise and lifestyle factors are important, your diet is key. That is because certain foods are known to raise cholesterol and others to lower it. The goal is to balance foods for their effect on cholesterol and more importantly, to replace saturated and trans fat with other types of fat, such as monounsaturated fat, polyunsaturated fat, and omega-3 fatty acids. On average, a person should consume no more than 15 to 17 g of saturated fat daily. Saturated and trans fats are considered "bad" fats, and they will raise LDL cholesterol. Saturated fats are primarily found in animal products such as meats, butter, and cream. However, that does not mean you need to give up all your favorite foods. Today, there are good tasting, low-fat, low-cholesterol substitutes for most of the things you like to eat. Choose low-fat or nonfat alternatives. Choose round or loin cuts of red meat. These types of cuts are lowest in fat and cholesterol. Chicken (without the skin), fish, veal, and ground turkey breast are great choices. Eliminate fatty meats, such as hot dogs and salami. Even shellfish have little or no saturated fat. Have a 3 oz (85 g) portion when you eat lean meat, poultry, or fish. Trans fats are also called "partially hydrogenated oils." They are oils that have been scientifically manipulated so that they are solid at room temperature resulting in a longer shelf life and improved taste and texture of foods in which they are added. Trans fats are found in stick margarine, some tub margarines, cookies, crackers, and baked goods.  When baking and cooking, oils are a great substitute for butter. The monounsaturated oils are  especially beneficial since it is believed they lower LDL and raise HDL. The oils you should avoid entirely are saturated tropical oils, such as coconut and palm.  Remember to eat a lot from food groups that are naturally free of saturated and trans fat, including fish, fruit, vegetables, beans, grains (barley, rice, couscous, bulgur wheat), and pasta (without cream sauces).  IDENTIFYING FOODS THAT LOWER FAT AND CHOLESTEROL  Soluble fiber may lower your cholesterol. This type of fiber is found in fruits such as apples, vegetables such as broccoli, potatoes, and carrots, legumes such as beans, peas, and lentils, and grains such as barley. Foods fortified with plant sterols (phytosterol) may also lower cholesterol. You should eat at least 2 g per day of these foods for a cholesterol lowering effect.  Read package labels to identify low-saturated fats, trans fat free, and low-fat foods at the supermarket. Select cheeses that have only 2 to 3 g saturated fat per ounce. Use a heart-healthy tub margarine that is free of trans fats or partially hydrogenated oil. When buying baked goods (cookies, crackers), avoid partially hydrogenated oils. Breads and muffins should be made from whole grains (whole-wheat or whole oat flour, instead of "flour" or "enriched flour"). Buy non-creamy canned soups with reduced salt and no added fats.  FOOD PREPARATION TECHNIQUES  Never deep-fry. If you must fry, either stir-fry, which uses very little fat, or use non-stick cooking sprays. When possible, broil, bake, or roast meats, and steam vegetables. Instead of putting butter or margarine on vegetables, use lemon and herbs, applesauce, and cinnamon (for squash and sweet potatoes). Use nonfat   yogurt, salsa, and low-fat dressings for salads.  LOW-SATURATED FAT / LOW-FAT FOOD SUBSTITUTES Meats / Saturated Fat (g)  Avoid: Steak, marbled (3 oz/85 g) / 11 g  Choose: Steak, lean (3 oz/85 g) / 4 g  Avoid: Hamburger (3 oz/85 g) / 7  g  Choose: Hamburger, lean (3 oz/85 g) / 5 g  Avoid: Ham (3 oz/85 g) / 6 g  Choose: Ham, lean cut (3 oz/85 g) / 2.4 g  Avoid: Chicken, with skin, dark meat (3 oz/85 g) / 4 g  Choose: Chicken, skin removed, dark meat (3 oz/85 g) / 2 g  Avoid: Chicken, with skin, light meat (3 oz/85 g) / 2.5 g  Choose: Chicken, skin removed, light meat (3 oz/85 g) / 1 g Dairy / Saturated Fat (g)  Avoid: Whole milk (1 cup) / 5 g  Choose: Low-fat milk, 2% (1 cup) / 3 g  Choose: Low-fat milk, 1% (1 cup) / 1.5 g  Choose: Skim milk (1 cup) / 0.3 g  Avoid: Hard cheese (1 oz/28 g) / 6 g  Choose: Skim milk cheese (1 oz/28 g) / 2 to 3 g  Avoid: Cottage cheese, 4% fat (1 cup) / 6.5 g  Choose: Low-fat cottage cheese, 1% fat (1 cup) / 1.5 g  Avoid: Ice cream (1 cup) / 9 g  Choose: Sherbet (1 cup) / 2.5 g  Choose: Nonfat frozen yogurt (1 cup) / 0.3 g  Choose: Frozen fruit bar / trace  Avoid: Whipped cream (1 tbs) / 3.5 g  Choose: Nondairy whipped topping (1 tbs) / 1 g Condiments / Saturated Fat (g)  Avoid: Mayonnaise (1 tbs) / 2 g  Choose: Low-fat mayonnaise (1 tbs) / 1 g  Avoid: Butter (1 tbs) / 7 g  Choose: Extra light margarine (1 tbs) / 1 g  Avoid: Coconut oil (1 tbs) / 11.8 g  Choose: Olive oil (1 tbs) / 1.8 g  Choose: Corn oil (1 tbs) / 1.7 g  Choose: Safflower oil (1 tbs) / 1.2 g  Choose: Sunflower oil (1 tbs) / 1.4 g  Choose: Soybean oil (1 tbs) / 2.4 g  Choose: Canola oil (1 tbs) / 1 g Document Released: 06/07/2005 Document Revised: 10/02/2012 Document Reviewed: 09/05/2013 [employer] Patient Information 2015 [employer]. This information is not intended to replace advice given to you by your health care provider. Make sure you discuss any questions you have with your health care provider.  

## 2014-01-31 ENCOUNTER — Telehealth: Payer: Self-pay | Admitting: Family Medicine

## 2014-01-31 LAB — CMP14+EGFR
A/G RATIO: 1.7 (ref 1.1–2.5)
ALBUMIN: 4.5 g/dL (ref 3.6–4.8)
ALT: 19 IU/L (ref 0–32)
AST: 27 IU/L (ref 0–40)
Alkaline Phosphatase: 75 IU/L (ref 39–117)
BILIRUBIN TOTAL: 0.5 mg/dL (ref 0.0–1.2)
BUN/Creatinine Ratio: 14 (ref 11–26)
BUN: 10 mg/dL (ref 8–27)
CALCIUM: 9.9 mg/dL (ref 8.7–10.3)
CO2: 25 mmol/L (ref 18–29)
CREATININE: 0.74 mg/dL (ref 0.57–1.00)
Chloride: 104 mmol/L (ref 97–108)
GFR calc non Af Amer: 85 mL/min/{1.73_m2} (ref >59)
GFR, EST AFRICAN AMERICAN: 98 mL/min/{1.73_m2} (ref >59)
GLOBULIN, TOTAL: 2.7 g/dL (ref 1.5–4.5)
GLUCOSE: 94 mg/dL (ref 65–99)
Potassium: 4.4 mmol/L (ref 3.5–5.2)
Sodium: 144 mmol/L (ref 134–144)
TOTAL PROTEIN: 7.2 g/dL (ref 6.0–8.5)

## 2014-01-31 LAB — NMR, LIPOPROFILE
CHOLESTEROL: 292 mg/dL — AB (ref 100–199)
HDL CHOLESTEROL BY NMR: 55 mg/dL (ref >39)
HDL Particle Number: 37.4 umol/L (ref >=30.5)
LDL Particle Number: 2302 nmol/L — ABNORMAL HIGH (ref <1000)
LDL Size: 20.1 nm (ref >20.5)
LDLC SERPL CALC-MCNC: 194 mg/dL — ABNORMAL HIGH (ref 0–99)
LP-IR Score: 76 — ABNORMAL HIGH (ref <=45)
SMALL LDL PARTICLE NUMBER: 1199 nmol/L — AB (ref <=527)
TRIGLYCERIDES BY NMR: 215 mg/dL — AB (ref 0–149)

## 2014-01-31 NOTE — Telephone Encounter (Signed)
Message copied by Azalee CourseFULP, Manette Doto on Thu Jan 31, 2014  1:51 PM ------      Message from: Bennie PieriniMARTIN, MARY-MARGARET      Created: Thu Jan 31, 2014  1:34 PM       Kidney and liver function stable      ldl particle numbers and LDL look terrible- Trig are elevated as well- zocor that took in the past not strong enough to work needs to be on lipitor or crestor- which would prefr- also needs fenofibrate to get trig down      Low carb diet- recheck labs in 3 months ------

## 2014-03-04 ENCOUNTER — Telehealth: Payer: Self-pay | Admitting: Nurse Practitioner

## 2014-03-04 DIAGNOSIS — K6289 Other specified diseases of anus and rectum: Secondary | ICD-10-CM

## 2014-03-04 NOTE — Telephone Encounter (Signed)
Resubmitted gi referral and put urgent onreferral- if do not here anything by end of week let me know

## 2014-03-04 NOTE — Telephone Encounter (Signed)
Pt aware. She has an appt with Gennette Pac in 2 days.

## 2014-03-06 ENCOUNTER — Encounter: Payer: Self-pay | Admitting: Nurse Practitioner

## 2014-03-06 ENCOUNTER — Ambulatory Visit (INDEPENDENT_AMBULATORY_CARE_PROVIDER_SITE_OTHER): Payer: Medicare HMO

## 2014-03-06 ENCOUNTER — Ambulatory Visit (INDEPENDENT_AMBULATORY_CARE_PROVIDER_SITE_OTHER): Payer: Medicare HMO | Admitting: Nurse Practitioner

## 2014-03-06 VITALS — BP 167/99 | HR 117 | Temp 97.3°F | Ht 63.0 in | Wt 126.6 lb

## 2014-03-06 DIAGNOSIS — R1032 Left lower quadrant pain: Secondary | ICD-10-CM

## 2014-03-06 DIAGNOSIS — R1031 Right lower quadrant pain: Secondary | ICD-10-CM

## 2014-03-06 DIAGNOSIS — G8929 Other chronic pain: Secondary | ICD-10-CM

## 2014-03-06 LAB — POCT CBC
GRANULOCYTE PERCENT: 59.8 % (ref 37–80)
HCT, POC: 39.4 % (ref 37.7–47.9)
Hemoglobin: 13.4 g/dL (ref 12.2–16.2)
LYMPH, POC: 1.8 (ref 0.6–3.4)
MCH, POC: 31 pg (ref 27–31.2)
MCHC: 34 g/dL (ref 31.8–35.4)
MCV: 91.2 fL (ref 80–97)
MPV: 7.3 fL (ref 0–99.8)
PLATELET COUNT, POC: 334 10*3/uL (ref 142–424)
POC Granulocyte: 3.2 (ref 2–6.9)
POC LYMPH %: 33 % (ref 10–50)
RBC: 4.3 M/uL (ref 4.04–5.48)
RDW, POC: 12.1 %
WBC: 5.4 10*3/uL (ref 4.6–10.2)

## 2014-03-06 NOTE — Progress Notes (Signed)
Subjective:    Patient ID: Courtney Johnson, female    DOB: [birth year], 68 y.o.   MRN: [id number]  Abdominal Pain This is a chronic problem. The current episode started more than 1 month ago (worsen latetly. ). The onset quality is gradual. The problem occurs intermittently. The problem has been gradually worsening. The pain is located in the generalized abdominal region (bloated.). The pain is at a severity of 5/10. The pain is mild. Quality: bloating.  The abdominal pain radiates to the back. Pertinent negatives include no nausea, vomiting or weight loss. Nothing aggravates the pain. The treatment provided mild relief. Prior workup: rollaids. There is no history of GERD.      Review of Systems  Constitutional: Negative.  Negative for weight loss.  HENT: Negative.   Eyes: Negative.   Respiratory: Negative.   Cardiovascular: Negative.   Gastrointestinal: Positive for abdominal pain. Negative for nausea and vomiting.  Endocrine: Negative.   Genitourinary: Negative.   Musculoskeletal: Negative.   Skin: Negative.   Allergic/Immunologic: Negative.   Neurological: Negative.   Hematological: Negative.   Psychiatric/Behavioral: Negative.        Objective:   Physical Exam  Constitutional: She is oriented to person, place, and time. She appears well-developed and well-nourished.  HENT:  Head: Normocephalic.  Eyes: Pupils are equal, round, and reactive to light.  Neck: Normal range of motion.  Cardiovascular: Normal rate and regular rhythm.   Pulmonary/Chest: Effort normal.  Abdominal: Soft. Bowel sounds are normal. There is tenderness. There is no rebound (on palmpation. ) and no guarding.  Musculoskeletal: Normal range of motion.  Neurological: She is alert and oriented to person, place, and time.  Skin: Skin is warm and dry.  Psychiatric: She has a normal mood and affect. Her behavior is normal.    BP 167/99  Pulse 117  Temp(Src) 97.3 F (36.3 C) (Oral)  Ht 5' 3"  (1.6 m)   Wt 126 lb 9.6 oz (57.425 kg)  BMI 22.43 kg/m2  KUB- gas- otherwise normal-Preliminary reading by Ronnald Collum, FNP  [facility]   Results for orders placed in visit on 03/06/14  POCT CBC      Result Value Ref Range   WBC 5.4  4.6 - 10.2 K/uL   Lymph, poc 1.8  0.6 - 3.4   POC LYMPH PERCENT 33.0  10 - 50 %L   POC Granulocyte 3.2  2 - 6.9   Granulocyte percent 59.8  37 - 80 %G   RBC 4.3  4.04 - 5.48 M/uL   Hemoglobin 13.4  12.2 - 16.2 g/dL   HCT, POC 39.4  37.7 - 47.9 %   MCV 91.2  80 - 97 fL   MCH, POC 31.0  27 - 31.2 pg   MCHC 34.0  31.8 - 35.4 g/dL   RDW, POC 12.1     Platelet Count, POC 334.0  142 - 424 K/uL   MPV 7.3  0 - 99.8 fL        Assessment & Plan:   1. Abdominal pain, chronic, bilateral lower quadrant    Orders Placed This Encounter  Procedures  . DG Abd 1 View    Standing Status: Future     Number of Occurrences:      Standing Expiration Date: 05/06/2015    Order Specific Question:  Reason for Exam (SYMPTOM  OR DIAGNOSIS REQUIRED)    Answer:  abd pain    Order Specific Question:  Preferred imaging location?    Answer:  Internal  .  H Pylori, IGM, IGG, IGA AB  . CMP14+EGFR  . POCT CBC   hemoccult cards given to patient with directions gasx or beano OTC Avoid spicy and fatty foods Will try to get referral to GI moved along Percival, FNP

## 2014-03-06 NOTE — Patient Instructions (Signed)

## 2014-03-08 ENCOUNTER — Telehealth: Payer: Self-pay | Admitting: Internal Medicine

## 2014-03-08 LAB — CMP14+EGFR
ALBUMIN: 4.5 g/dL (ref 3.6–4.8)
ALK PHOS: 75 IU/L (ref 39–117)
ALT: 23 IU/L (ref 0–32)
AST: 22 IU/L (ref 0–40)
Albumin/Globulin Ratio: 1.7 (ref 1.1–2.5)
BUN / CREAT RATIO: 12 (ref 11–26)
BUN: 9 mg/dL (ref 8–27)
CALCIUM: 10.2 mg/dL (ref 8.7–10.3)
CO2: 24 mmol/L (ref 18–29)
CREATININE: 0.76 mg/dL (ref 0.57–1.00)
Chloride: 104 mmol/L (ref 97–108)
GFR calc Af Amer: 95 mL/min/{1.73_m2} (ref >59)
GFR, EST NON AFRICAN AMERICAN: 82 mL/min/{1.73_m2} (ref >59)
GLOBULIN, TOTAL: 2.6 g/dL (ref 1.5–4.5)
Glucose: 93 mg/dL (ref 65–99)
Potassium: 4.1 mmol/L (ref 3.5–5.2)
Sodium: 144 mmol/L (ref 134–144)
Total Bilirubin: 0.2 mg/dL (ref 0.0–1.2)
Total Protein: 7.1 g/dL (ref 6.0–8.5)

## 2014-03-08 LAB — H PYLORI, IGM, IGG, IGA AB
H Pylori IgG: >8 U/mL — ABNORMAL HIGH (ref 0.0–0.8)
H. pylori, IgA Abs: <9 units (ref 0.0–8.9)
H. pylori, IgM Abs: <9 units (ref 0.0–8.9)

## 2014-03-08 NOTE — Telephone Encounter (Signed)
Patient is scheduled for 03/18/14 1:30 with Willette Cluster RNP .  She is aware of the appt date and time

## 2014-03-10 ENCOUNTER — Other Ambulatory Visit: Payer: Self-pay | Admitting: Nurse Practitioner

## 2014-03-10 MED ORDER — AMOXICILL-CLARITHRO-LANSOPRAZ PO MISC: Freq: Two times a day (BID) | ORAL | Status: DC

## 2014-03-14 ENCOUNTER — Telehealth: Payer: Self-pay | Admitting: Internal Medicine

## 2014-03-14 NOTE — Telephone Encounter (Signed)
Patient is scheduled for OV with Willette Cluster RNP on Monday for abdominal pain and rectal bleeding.  She denies rectal bleeding.  She does reports that [facility] is currently treating her for h pylori.  She wants to see if this resolves her symptoms prior to coming for an office visit.  She will call back to reschedule if her pain is not resolved

## 2014-03-14 NOTE — Telephone Encounter (Signed)
Left message for patient to call back  

## 2014-03-18 ENCOUNTER — Ambulatory Visit: Payer: Commercial Managed Care - HMO | Admitting: Nurse Practitioner

## 2014-04-01 ENCOUNTER — Telehealth: Payer: Self-pay | Admitting: Internal Medicine

## 2014-04-01 NOTE — Telephone Encounter (Signed)
I have rescheduled with the patient for 04/10/14 1:30 with Willette ClusterPaula Guenther RNP

## 2014-04-01 NOTE — Telephone Encounter (Signed)
Phone is busy I will continue to try and reach the patient  

## 2014-04-10 ENCOUNTER — Encounter: Payer: Self-pay | Admitting: Nurse Practitioner

## 2014-04-10 ENCOUNTER — Ambulatory Visit (INDEPENDENT_AMBULATORY_CARE_PROVIDER_SITE_OTHER): Payer: Commercial Managed Care - HMO | Admitting: Nurse Practitioner

## 2014-04-10 VITALS — BP 146/80 | HR 76 | Ht 63.0 in | Wt 124.8 lb

## 2014-04-10 DIAGNOSIS — R1084 Generalized abdominal pain: Secondary | ICD-10-CM

## 2014-04-10 DIAGNOSIS — R140 Abdominal distension (gaseous): Secondary | ICD-10-CM

## 2014-04-10 DIAGNOSIS — K6289 Other specified diseases of anus and rectum: Secondary | ICD-10-CM | POA: Insufficient documentation

## 2014-04-10 DIAGNOSIS — R109 Unspecified abdominal pain: Secondary | ICD-10-CM | POA: Insufficient documentation

## 2014-04-10 DIAGNOSIS — K219 Gastro-esophageal reflux disease without esophagitis: Secondary | ICD-10-CM

## 2014-04-10 MED ORDER — OMEPRAZOLE 20 MG PO CPDR: 20.0000 mg | DELAYED_RELEASE_CAPSULE | Freq: Every day | ORAL | Status: DC

## 2014-04-10 NOTE — Progress Notes (Signed)
     History of Present Illness:   Patient is a 68 year old female known to Dr. Leone PayorGessner. She has a [facility] of colon cancer and underwent screening November 2014 with findings of moderate sigmoid diverticulosis and internal hemorrhoids. She is due for repeat colonoscopy November 2019  Patient saw PCP in mid September for "horrible" generalized abdominal pain. CMET, CBC and KUB were normal.  H. pylori IgG was elevated above 8, she has prescribed, and completed Prevpac. The abdominal pain has resolved but she complains of bloating/gas which are chronic. She also complains of pyrosis 1-2 times a week. She has occasional diffuse upper abdominal pain. Patient has taken a daily baby aspirin but recently stopped that to see if abdominal symptoms would improve. She takes Rolaids for GERD symptoms. BMs normal   Current Medications, Allergies, Past Medical History, Past Surgical History, Family History and Social History were [facility] electronic [facility].   Physical Exam: General: Pleasant, well developed , white female in no acute distress Head: Normocephalic and atraumatic Eyes:  sclerae anicteric, conjunctiva pink  Ears: Normal auditory acuity Lungs: Clear throughout to auscultation Heart: Regular rate and rhythm Abdomen: Soft, non distended, non-tender. No masses, no hepatomegaly. Normal bowel sounds Musculoskeletal: Symmetrical with no gross deformities  Extremities: No edema  Neurological: Alert oriented x 4, grossly nonfocal Psychological:  Alert and cooperative. Normal mood and affect  Assessment and Recommendations: 1.  Pleasant 68 year old female here with several gastrointestinal issues. She was evaluated by her PCP for generalized abdominal pain in mid September. H. pylori antibody positive, she completed PrevPac. Her abdominal pain for which she saw PCP in September has resoved.  Hard to know the etiology of her generalized abdominal pain but seems unlikely to be  related to H. pylori. At any rate she is feeling better. See #2   2. GERD.  Patient takes Rolaids for heartburn 1-2 times a week . Recommend trial of daily omeprazole 20mg  which may also help with epigastric discomfort.   3. intermittent, vague epigastric discomfort / bloating/gas.  Patient gives a history of chronic "stomach problems". Etiology unclear but possibly functional. She was taking a daily baby aspirin so NSAID-induced gastropathy not excluded. Recent labs / KUB normal.   trial of daily omeprazole for heartburn and epigastric discomfort. She will continue to hold the baby aspirin for now.   Trial of Align for gas/ bloat.  Patient will contact us in 3-4 weeks with a condition update. If still having upper GI symptoms then will consider further abdominal imaging and /or EGD.

## 2014-04-10 NOTE — Patient Instructions (Addendum)
We have sent the following medications to your pharmacy for you to pick up at your convenience: Prilosec, Take 30 minutes before breakfast daily  Call us in a month with an update on how your doing.   Today you have been given handout's to read and follow on GERD and Gas prevention.   I appreciate the opportunity to care for you.

## 2014-04-12 ENCOUNTER — Other Ambulatory Visit: Payer: Medicare HMO

## 2014-04-12 DIAGNOSIS — Z1212 Encounter for screening for malignant neoplasm of rectum: Secondary | ICD-10-CM

## 2014-04-12 NOTE — Progress Notes (Signed)
Lab only 

## 2014-04-14 LAB — FECAL OCCULT BLOOD, IMMUNOCHEMICAL: FECAL OCCULT BLD: NEGATIVE

## 2014-04-15 ENCOUNTER — Ambulatory Visit (INDEPENDENT_AMBULATORY_CARE_PROVIDER_SITE_OTHER): Payer: Medicare HMO

## 2014-04-15 ENCOUNTER — Other Ambulatory Visit: Payer: Self-pay | Admitting: *Deleted

## 2014-04-15 DIAGNOSIS — Z23 Encounter for immunization: Secondary | ICD-10-CM

## 2014-04-15 NOTE — Progress Notes (Signed)
Agree with Ms. Guenther's assessment and plan. Elda Dunkerson E. Bertrum Helmstetter, MD, FACG   

## 2014-04-24 ENCOUNTER — Encounter: Payer: Self-pay | Admitting: Pharmacist

## 2014-04-24 ENCOUNTER — Ambulatory Visit (INDEPENDENT_AMBULATORY_CARE_PROVIDER_SITE_OTHER): Payer: Medicare HMO | Admitting: Pharmacist

## 2014-04-24 ENCOUNTER — Ambulatory Visit (INDEPENDENT_AMBULATORY_CARE_PROVIDER_SITE_OTHER): Payer: Medicare HMO

## 2014-04-24 VITALS — Ht 63.5 in | Wt 127.0 lb

## 2014-04-24 DIAGNOSIS — M8580 Other specified disorders of bone density and structure, unspecified site: Secondary | ICD-10-CM | POA: Insufficient documentation

## 2014-04-24 DIAGNOSIS — Z13820 Encounter for screening for osteoporosis: Secondary | ICD-10-CM

## 2014-04-24 LAB — HM DEXA SCAN

## 2014-04-24 NOTE — Patient Instructions (Signed)

## 2014-04-24 NOTE — Progress Notes (Signed)
Patient ID: Courtney Johnson, female   DOB: April 11, 1946, 68 y.o.   MRN: [id number]  [facility] Current Height: Height: 5' 3.5" (161.3 cm)      Max Lifetime Height:  5' 3.5" Current Weight: Weight: 127 lb (57.607 kg)       Ethnicity:Caucasian    HPI: Does pt already have a diagnosis of:  Osteopenia?  Yes Osteoporosis?  No  Back Pain?  No       Kyphosis?  No Prior fracture?  Yes - foot - fell from poarch Med(s) for Osteoporosis/Osteopenia:  none Med(s) previously tried for Osteoporosis/Osteopenia:  Recommended fosamax last visit but patient never started                                                             PMH: Age at menopause:  [age]'s - surgical Hysterectomy?  Yes Oophorectomy?  No HRT? No Steroid Use?  No Thyroid med?  No History of cancer?  No History of digestive disorders (ie Crohn's)?  Yes - currently taking PPI  Current or previous eating disorders?  No Last Vitamin D Result:  41.9 (06/18/2013) Last GFR Result:  82 (03/06/2014)   FH/SH: Family history of osteoporosis?  No Parent with history of hip fracture?  No Family history of breast cancer?  No Exercise?  Yes - tries to exercise 1-2 timers per week Smoking?  No Alcohol?  No    Calcium Assessment Calcium Intake  # of servings/day  Calcium mg  Milk (8 oz) 1  x  300  = 300mg   Yogurt (4 oz) 0 x  200 = 0  Cheese (1 oz) 0 x  200 = 0  Other Calcium sources   250mg   Ca supplement MVI = 400mg    Estimated calcium intake per day 950mg     DEXA Results Date of Test T-Score for AP Spine L1-L4 T-Score for Total Left Hip T-Score for Total Right Hip  04/24/2014 -2.4 -1.2 -1.8  12/30/2010 -2.2 -1.3 -1.8  12/02/1998 -0.6 -0.6 --        FRAX 10 year estimate: Total FX risk:  20% (consider medication if >/= 20%) Hip FX risk:  4.3%  (consider medication if >/= 3%)  Assessment: Osteopenia - with high fracture risk based on FRAX estimate  Recommendations: 1.  Discussed DEXA results and fracture risk.   Patient refused medication today but states that she will research the options we discussed further and might start one - either bisphosphonate or evista. 2.  recommend calcium 1200mg  daily through supplementation or diet.  3.  recommend weight bearing exercise - 30 minutes at least 4 days per week.   4.  Counseled and educated about fall risk and prevention.   Recheck DEXA:  2 years  Time spent counseling patient:  30 minutes   Courtney Johnson, PharmD, CPP

## 2014-05-09 ENCOUNTER — Telehealth: Payer: Self-pay | Admitting: Nurse Practitioner

## 2014-05-09 NOTE — Telephone Encounter (Signed)
Spoke with pt and she states she is still having issues with heartburn and some burning in her stomach. Pt states she is taking the prilosec daily but is still having issues, states it does not happen everyday. Please advise.

## 2014-05-16 NOTE — Telephone Encounter (Signed)
Bonita QuinLinda,  Does she have burning in her chest? If so, please ask her to increase prilosce 20mg  to BID before meals. Please review anti-reflux measures. If this sounds more like abdominal pain instead of reflux then please let me know as EGD and order abdominal imaging will likely be next step. Thanks

## 2014-05-21 NOTE — Telephone Encounter (Signed)
Left message for pt to call back  °

## 2014-05-22 NOTE — Telephone Encounter (Signed)
Left message for pt to call back  °

## 2014-05-23 MED ORDER — OMEPRAZOLE 20 MG PO CPDR: 20.0000 mg | DELAYED_RELEASE_CAPSULE | Freq: Two times a day (BID) | ORAL | Status: DC

## 2014-05-23 NOTE — Telephone Encounter (Signed)
Spoke with pt and she states she still has some burning in her chest. Pt will try taking the prilosec BID and see if that helps. Pt instructed to call back if no improvement, she verbalized understanding.

## 2014-09-23 ENCOUNTER — Ambulatory Visit (INDEPENDENT_AMBULATORY_CARE_PROVIDER_SITE_OTHER): Payer: Commercial Managed Care - HMO | Admitting: Family Medicine

## 2014-09-23 ENCOUNTER — Encounter: Payer: Self-pay | Admitting: Family Medicine

## 2014-09-23 VITALS — BP 153/96 | HR 81 | Temp 97.9°F | Ht 63.5 in | Wt 125.0 lb

## 2014-09-23 DIAGNOSIS — IMO0001 Reserved for inherently not codable concepts without codable children: Secondary | ICD-10-CM

## 2014-09-23 DIAGNOSIS — J329 Chronic sinusitis, unspecified: Secondary | ICD-10-CM | POA: Diagnosis not present

## 2014-09-23 DIAGNOSIS — R05 Cough: Secondary | ICD-10-CM

## 2014-09-23 DIAGNOSIS — H6123 Impacted cerumen, bilateral: Secondary | ICD-10-CM

## 2014-09-23 DIAGNOSIS — R059 Cough, unspecified: Secondary | ICD-10-CM

## 2014-09-23 DIAGNOSIS — R030 Elevated blood-pressure reading, without diagnosis of hypertension: Secondary | ICD-10-CM

## 2014-09-23 MED ORDER — AMOXICILLIN 875 MG PO TABS
875.0000 mg | ORAL_TABLET | Freq: Two times a day (BID) | ORAL | Status: DC
Start: 2014-09-23 — End: 2015-01-02

## 2014-09-23 NOTE — Progress Notes (Signed)
Subjective:    Patient ID: Courtney Johnson, female    DOB: 1945-11-14, 69 y.o.   MRN: [id number]  HPI Patient here today for cold and sinus trouble. She is also having bilateral ear pain. This all started about 1 week ago.         Patient Active Problem List   Diagnosis Date Noted  . Osteopenia 04/24/2014  . Bloating 04/10/2014  . Abdominal pain 04/10/2014  . GERD (gastroesophageal reflux disease) 04/10/2014  . Hyperlipidemia 06/23/2013  . Generalized anxiety disorder 06/23/2013  . Family history of colon cancer - sister - late 350's 04/26/2013  . RECTAL BLEEDING 10/23/2007   Outpatient Encounter Prescriptions as of 09/23/2014  Medication Sig  . ALPRAZolam (XANAX) 0.25 MG tablet Take 1 tablet (0.25 mg total) by mouth 2 (two) times daily as needed for sleep.  Marland Kitchen. aspirin 81 MG tablet Take 81 mg by mouth daily.  . Multiple Vitamins-Minerals (CENTRUM ADULTS PO) Take 1 tablet by mouth daily.  Marland Kitchen. omeprazole (PRILOSEC) 20 MG capsule Take 1 capsule (20 mg total) by mouth daily.  . Vitamins A & D 5000-400 UNITS CAPS   . [DISCONTINUED] omeprazole (PRILOSEC) 20 MG capsule Take 1 capsule (20 mg total) by mouth 2 (two) times daily before a meal.    Review of Systems  Constitutional: Negative.  Negative for fever.  HENT: Positive for congestion, ear pain (bilateral) and sinus pressure.        Seasonal allergies  Eyes: Negative.   Respiratory: Positive for cough.   Cardiovascular: Negative.   Gastrointestinal: Negative.   Endocrine: Negative.   Genitourinary: Negative.   Musculoskeletal: Negative.   Skin: Negative.   Allergic/Immunologic: Negative.   Neurological: Negative.   Hematological: Negative.   Psychiatric/Behavioral: Negative.        Objective:   Physical Exam  Constitutional: She is oriented to person, place, and time. She appears well-developed and well-nourished. No distress.  HENT:  Head: Normocephalic and atraumatic.  Left Ear: External ear normal.  The left  TM is normal the right TM is not visualized and the ear canal is impacted with cerumen. There is sinus tenderness frontal ethmoid and maxillary. There is nasal congestion and turbinate swelling bilaterally.  Eyes: Conjunctivae and EOM are normal. Pupils are equal, round, and reactive to light. Right eye exhibits no discharge. Left eye exhibits no discharge. No scleral icterus.  Neck: Normal range of motion. Neck supple. No JVD present. No thyromegaly present.  Anterior cervical adenopathy on the left  Cardiovascular: Normal rate, regular rhythm and normal heart sounds.   No murmur heard. Pulmonary/Chest: Effort normal. No respiratory distress. She has no wheezes. She has no rales. She exhibits no tenderness.  Rhonchi with coughing and no rales  Abdominal: She exhibits no mass.  Musculoskeletal: Normal range of motion.  Lymphadenopathy:    She has cervical adenopathy.  Neurological: She is alert and oriented to person, place, and time. She has normal reflexes.  Skin: Skin is warm and dry. No rash noted.  Psychiatric: She has a normal mood and affect. Her behavior is normal. Judgment and thought content normal.  Nursing note and vitals reviewed.  BP 153/96 mmHg  Pulse 81  Temp(Src) 97.9 F (36.6 C) (Oral)  Ht 5' 3.5" (1.613 m)  Wt 125 lb (56.7 kg)  BMI 21.79 kg/m2  Repeat blood pressure 120/90 right arm sitting    Assessment & Plan:  1. Rhinosinusitis -Use nasal saline and Flonase as directed - amoxicillin (AMOXIL) 875 MG tablet;  Take 1 tablet (875 mg total) by mouth 2 (two) times daily.  Dispense: 20 tablet; Refill: 0  2. Pressure blood increased -Watch sodium intake and monitor blood pressure readings at home and bring these to the nurse visit with your ear irrigation  3. Cough -Take Mucinex maximum strength 1 twice daily with a large glass of water  4. Impacted cerumen of both ears -Use Debrox as directed  Patient Instructions  Take antibiotic as directed Purchase Flonase  over-the-counter and 1-2 sprays each nostril at bedtime Use Claritin, Allegra, or Zyrtec as an antihistamine and take one daily for allergy Take Mucinex, maximum strength, blue and white in color, 1 twice daily with a large glass of water Watch sodium intake Use Debrox eardrops over-the-counter--- 3-4 drops to the right ear canal nightly for 3-4 nights in a row, weight 1 week and repeat. Return to the office in a couple weeks for nurse to irrigate the ear canal You can use nasal saline during the day to help hydrate the nasal passages and use the Flonase at nighttime Bring blood pressures with you to the next visit for the ear wash Drink plenty of fluids   Nyra Capes MD

## 2014-09-23 NOTE — Patient Instructions (Addendum)
Take antibiotic as directed Purchase Flonase over-the-counter and 1-2 sprays each nostril at bedtime Use Claritin, Allegra, or Zyrtec as an antihistamine and take one daily for allergy Take Mucinex, maximum strength, blue and white in color, 1 twice daily with a large glass of water Watch sodium intake Use Debrox eardrops over-the-counter--- 3-4 drops to the right ear canal nightly for 3-4 nights in a row, weight 1 week and repeat. Return to the office in a couple weeks for nurse to irrigate the ear canal You can use nasal saline during the day to help hydrate the nasal passages and use the Flonase at nighttime Bring blood pressures with you to the next visit for the ear wash Drink plenty of fluids

## 2014-10-07 ENCOUNTER — Ambulatory Visit: Payer: Commercial Managed Care - HMO | Admitting: *Deleted

## 2014-10-07 DIAGNOSIS — H6123 Impacted cerumen, bilateral: Secondary | ICD-10-CM

## 2014-10-07 NOTE — Patient Instructions (Signed)
Cerumen Impaction °A cerumen impaction is when the wax in your ear forms a plug. This plug usually causes reduced hearing. Sometimes it also causes an earache or dizziness. Removing a cerumen impaction can be difficult and painful. The wax sticks to the ear canal. The canal is sensitive and bleeds easily. If you try to remove a heavy wax buildup with a cotton tipped swab, you may push it in further. °Irrigation with water, suction, and small ear curettes may be used to clear out the wax. If the impaction is fixed to the skin in the ear canal, ear drops may be needed for a few days to loosen the wax. People who build up a lot of wax frequently can use ear wax removal products available in your local drugstore. °SEEK MEDICAL CARE IF:  °You develop an earache, increased hearing loss, or marked dizziness. °Document Released: 07/15/2004 Document Revised: 08/30/2011 Document Reviewed: 09/04/2009 °ExitCare® Patient Information ©2015 [employer]. This information is not intended to replace advice given to you by your health care provider. Make sure you discuss any questions you have with your health care provider. ° °

## 2014-10-07 NOTE — Progress Notes (Signed)
Pt came in today to have ear lavage of the right ear. Pt was seen by Dr.Moore 2 weeks ago with cerumen impaction of the right ear. After lavage of the right ear pt voiced she could here. Pt advised to continue using the debrox for 2-3 more days and then to CB if she continues to have any problems. Pt voiced understanding.

## 2014-10-17 DIAGNOSIS — Z1231 Encounter for screening mammogram for malignant neoplasm of breast: Secondary | ICD-10-CM | POA: Diagnosis not present

## 2014-10-23 ENCOUNTER — Encounter: Payer: Self-pay | Admitting: Nurse Practitioner

## 2014-11-12 ENCOUNTER — Telehealth: Payer: Self-pay | Admitting: Nurse Practitioner

## 2014-11-12 MED ORDER — SULFAMETHOXAZOLE-TRIMETHOPRIM 800-160 MG PO TABS: 1.0000 | ORAL_TABLET | Freq: Two times a day (BID) | ORAL | Status: DC

## 2014-11-12 NOTE — Telephone Encounter (Signed)
Bactrim sent to pharmacy

## 2014-11-12 NOTE — Telephone Encounter (Signed)
Patient aware.

## 2015-01-02 ENCOUNTER — Encounter: Payer: Self-pay | Admitting: Nurse Practitioner

## 2015-01-02 ENCOUNTER — Encounter (INDEPENDENT_AMBULATORY_CARE_PROVIDER_SITE_OTHER): Payer: Self-pay

## 2015-01-02 ENCOUNTER — Ambulatory Visit (INDEPENDENT_AMBULATORY_CARE_PROVIDER_SITE_OTHER): Payer: Commercial Managed Care - HMO | Admitting: Nurse Practitioner

## 2015-01-02 VITALS — BP 172/90 | HR 74 | Temp 97.0°F | Ht 63.0 in | Wt 127.0 lb

## 2015-01-02 DIAGNOSIS — R300 Dysuria: Secondary | ICD-10-CM

## 2015-01-02 DIAGNOSIS — N3000 Acute cystitis without hematuria: Secondary | ICD-10-CM | POA: Diagnosis not present

## 2015-01-02 LAB — POCT URINALYSIS DIPSTICK
Bilirubin, UA: NEGATIVE
Glucose, UA: NEGATIVE
Ketones, UA: NEGATIVE
NITRITE UA: NEGATIVE
Spec Grav, UA: <=1.005
Urobilinogen, UA: NEGATIVE
pH, UA: 6.5

## 2015-01-02 LAB — POCT UA - MICROSCOPIC ONLY
Casts, Ur, LPF, POC: NEGATIVE
Crystals, Ur, HPF, POC: NEGATIVE
Mucus, UA: NEGATIVE
Yeast, UA: NEGATIVE

## 2015-01-02 MED ORDER — CIPROFLOXACIN HCL 500 MG PO TABS: 500.0000 mg | ORAL_TABLET | Freq: Two times a day (BID) | ORAL | Status: DC

## 2015-01-02 NOTE — Patient Instructions (Signed)
Take medication as prescribe Cotton underwear Take shower not bath Cranberry juice, yogurt Force fluids AZO over the counter X2 days Culture pending RTO prn  

## 2015-01-02 NOTE — Progress Notes (Signed)
  Subjective:    Courtney Johnson is a 69 y.o. female who complains of burning with urination, dysuria, frequency and urgency. She has had symptoms for 3 days. Patient also complains of back pain. Patient denies fever, stomach ache and vaginal discharge. Patient does have a history of recurrent UTI. Patient does not have a history of pyelonephritis.   The following portions of the patient's history were reviewed and updated as appropriate: allergies, current medications, past family history, past medical history, past social history, past surgical history and problem list.  Review of Systems Pertinent items are noted in HPI.    Objective:    BP 172/90 mmHg  Pulse 74  Temp(Src) 97 F (36.1 C) (Oral)  Ht 5\' 3"  (1.6 m)  Wt 127 lb (57.607 kg)  BMI 22.50 kg/m2 General appearance: alert and cooperative Lungs: clear to auscultation bilaterally Heart: regular rate and rhythm, S1, S2 normal, no murmur, click, rub or gallop Abdomen: soft with active bowel sounds, slight suprapubic pain on palpation. No CVA tenderness  Laboratory:       Assessment:    Acute cystitis     Plan:     1. Dysuria - POCT UA - Microscopic Only - POCT urinalysis dipstick  2. Acute cystitis without hematuria Take medication as prescribe Cotton underwear Take shower not bath Cranberry juice, yogurt Force fluids AZO over the counter X2 days Culture pending RTO prn - Urine culture - ciprofloxacin (CIPRO) 500 MG tablet; Take 1 tablet (500 mg total) by mouth 2 (two) times daily.  Dispense: 10 tablet; Refill: 0  Mary-Margaret Daphine DeutscherMartin, FNP

## 2015-01-06 LAB — URINE CULTURE

## 2015-01-07 ENCOUNTER — Other Ambulatory Visit: Payer: Self-pay | Admitting: Nurse Practitioner

## 2015-01-07 ENCOUNTER — Telehealth: Payer: Self-pay | Admitting: Nurse Practitioner

## 2015-01-07 DIAGNOSIS — N3000 Acute cystitis without hematuria: Secondary | ICD-10-CM

## 2015-01-07 MED ORDER — CIPROFLOXACIN HCL 500 MG PO TABS: 500.0000 mg | ORAL_TABLET | Freq: Two times a day (BID) | ORAL | Status: DC

## 2015-01-07 NOTE — Telephone Encounter (Signed)
Detailed message left for patient that more antibiotics have sent into pharmacy.

## 2015-01-07 NOTE — Telephone Encounter (Signed)
Just got culture back and the right antibiotic was given i will send in a few more pill.

## 2015-01-08 IMAGING — CR DG ABDOMEN 1V
1 series · 1 of 1 positions shown · non-contrast
Comparison: Scout image for CT scan dated 03/24/2007

CLINICAL DATA: Back pain and abdominal bloating.  Abdominal pain.

EXAM:
ABDOMEN - 1 VIEW

[view not recorded]
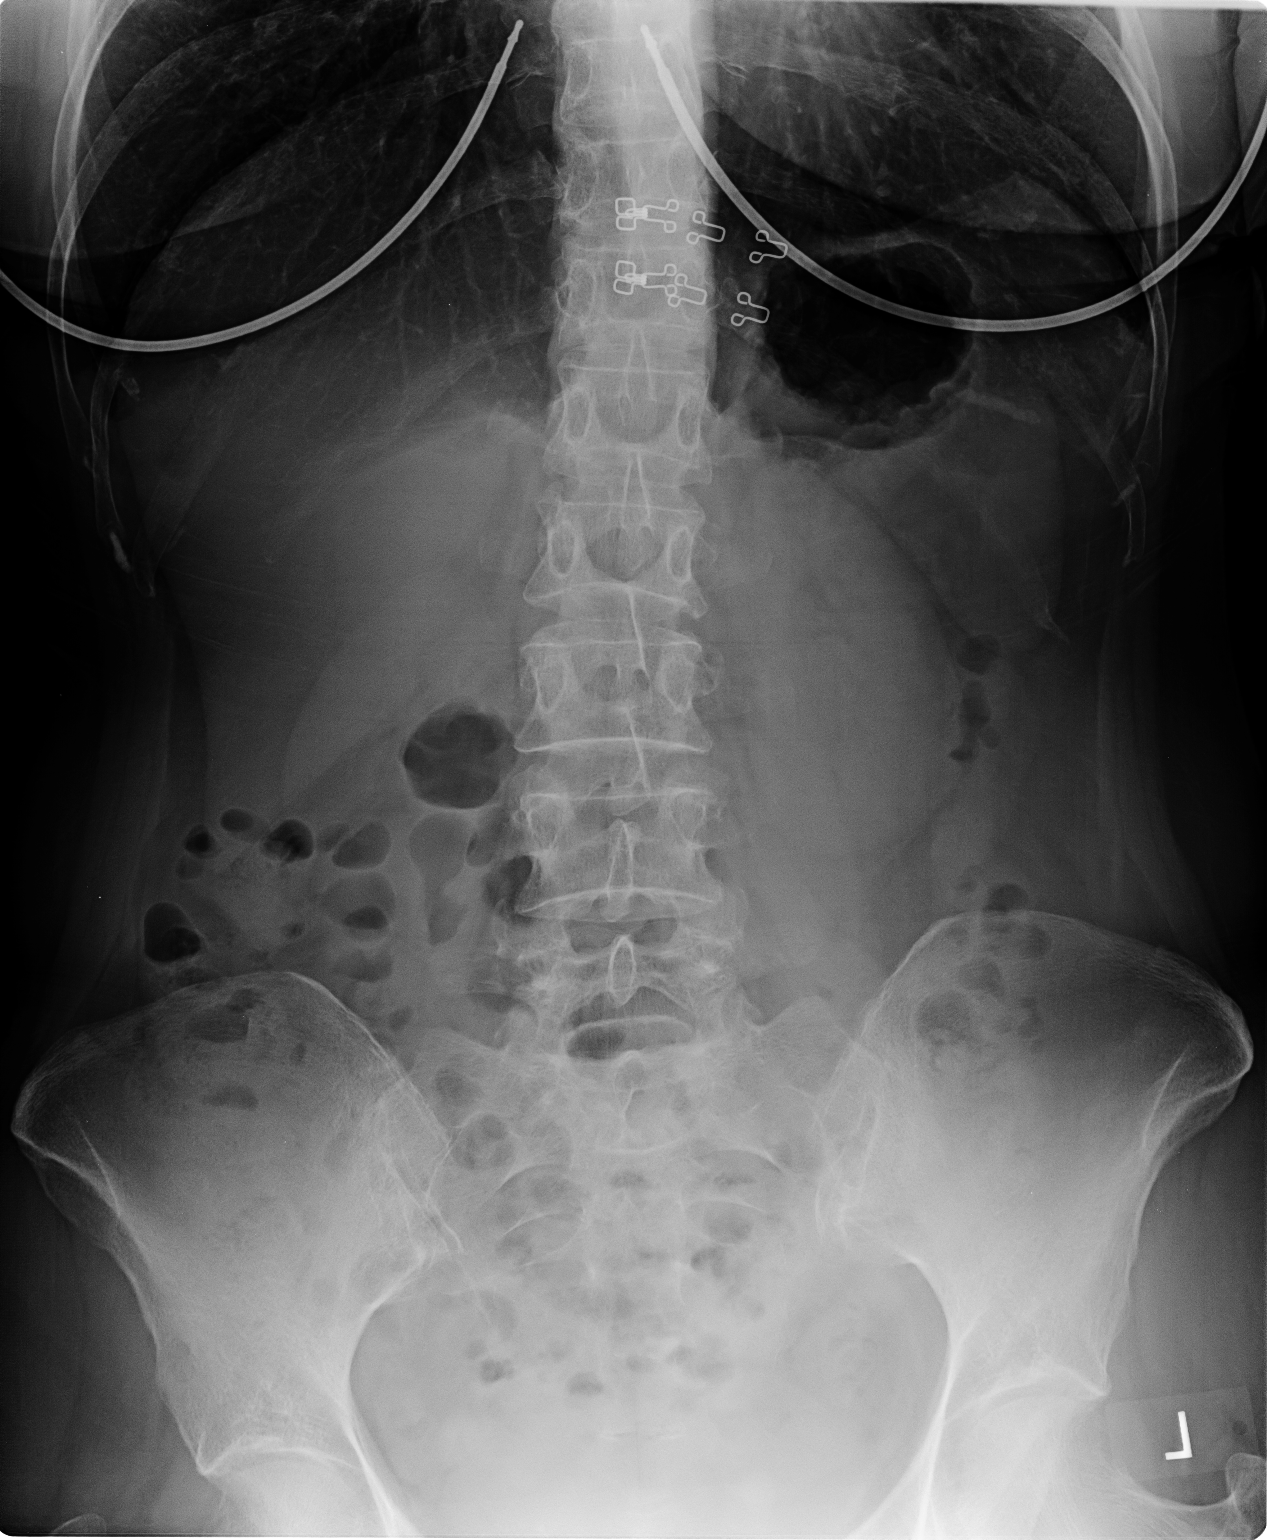

[1 of 1 positions shown; findings below may reference images not displayed]

FINDINGS: The bowel gas pattern is normal with only a small amount of air
scattered throughout nondistended loops of bowel. No abnormal
abdominal calcifications. No osseous abnormality.
IMPRESSION: Benign appearing abdomen.

## 2015-01-11 ENCOUNTER — Encounter: Payer: Self-pay | Admitting: Family

## 2015-01-11 ENCOUNTER — Ambulatory Visit (INDEPENDENT_AMBULATORY_CARE_PROVIDER_SITE_OTHER): Payer: Commercial Managed Care - HMO | Admitting: Family

## 2015-01-11 VITALS — BP 148/89 | HR 88 | Temp 97.2°F | Ht 63.0 in | Wt 125.0 lb

## 2015-01-11 DIAGNOSIS — R1030 Lower abdominal pain, unspecified: Secondary | ICD-10-CM | POA: Diagnosis not present

## 2015-01-11 DIAGNOSIS — N3000 Acute cystitis without hematuria: Secondary | ICD-10-CM

## 2015-01-11 LAB — POCT URINALYSIS DIPSTICK
Bilirubin, UA: NEGATIVE
Blood, UA: NEGATIVE
GLUCOSE UA: NEGATIVE
Ketones, UA: NEGATIVE
LEUKOCYTES UA: NEGATIVE
Nitrite, UA: NEGATIVE
PROTEIN UA: NEGATIVE
Spec Grav, UA: <=1.005
UROBILINOGEN UA: NEGATIVE
pH, UA: 6.5

## 2015-01-11 LAB — POCT UA - MICROSCOPIC ONLY
Bacteria, U Microscopic: NEGATIVE
Casts, Ur, LPF, POC: NEGATIVE
Crystals, Ur, HPF, POC: NEGATIVE
Epithelial cells, urine per micros: NEGATIVE
Mucus, UA: NEGATIVE
Yeast, UA: NEGATIVE

## 2015-01-11 MED ORDER — SULFAMETHOXAZOLE-TRIMETHOPRIM 800-160 MG PO TABS
1.0000 | ORAL_TABLET | Freq: Two times a day (BID) | ORAL | Status: DC
Start: 2015-01-11 — End: 2015-03-27

## 2015-01-11 NOTE — Progress Notes (Signed)
   Subjective:    Patient ID: Courtney Johnson, female    DOB: 12/16/1945, 69 y.o.   MRN: [id number]  Abdominal Pain This is a new problem. The current episode started 1 to 4 weeks ago (About 10 days ago). The onset quality is gradual. The problem occurs constantly. The problem has been waxing and waning. The pain is located in the suprapubic region. The pain is mild. Quality: pressure. Associated symptoms include dysuria and frequency. Pertinent negatives include no fever, headaches, hematuria, nausea or vomiting. The pain is relieved by nothing. Treatments tried: Cipro. The treatment provided no relief.      Review of Systems  Constitutional: Negative.  Negative for fever.  HENT: Negative.   Eyes: Negative.   Respiratory: Negative.  Negative for shortness of breath.   Cardiovascular: Negative.  Negative for palpitations.  Gastrointestinal: Positive for abdominal pain. Negative for nausea and vomiting.  Endocrine: Negative.   Genitourinary: Positive for dysuria and frequency. Negative for hematuria.  Musculoskeletal: Negative.   Neurological: Negative.  Negative for headaches.  Hematological: Negative.   Psychiatric/Behavioral: Negative.   All other systems reviewed and are negative.      Objective:   Physical Exam  Constitutional: She is oriented to person, place, and time. She appears well-developed and well-nourished. No distress.  HENT:  Head: Normocephalic and atraumatic.  Eyes: Pupils are equal, round, and reactive to light.  Neck: Normal range of motion. Neck supple. No thyromegaly present.  Cardiovascular: Normal rate, regular rhythm, normal heart sounds and intact distal pulses.   No murmur heard. Pulmonary/Chest: Effort normal and breath sounds normal. No respiratory distress. She has no wheezes.  Abdominal: Soft. Bowel sounds are normal. She exhibits no distension. There is tenderness (mild lower abd tenderness).  Musculoskeletal: Normal range of motion. She  exhibits no edema or tenderness.  Neurological: She is alert and oriented to person, place, and time. She has normal reflexes. No cranial nerve deficit.  Skin: Skin is warm and dry.  Psychiatric: She has a normal mood and affect. Her behavior is normal. Judgment and thought content normal.  Vitals reviewed.     BP 148/89 mmHg  Pulse 88  Temp(Src) 97.2 F (36.2 C) (Oral)  Ht  (1.6 m)  Wt 125 lb (56.7 kg)  BMI 22.15 kg/m2     Assessment & Plan:  1. Lower abdominal pain - POCT urinalysis dipstick - POCT UA - Microscopic Only  2. Acute cystitis without hematuria -Force fluids AZO over the counter X2 days RTO prn Culture pending - Urine culture - sulfamethoxazole-trimethoprim (BACTRIM DS,SEPTRA DS) 800-160 MG per tablet; Take 1 tablet by mouth 2 (two) times daily.  Dispense: 10 tablet; Refill: 0  Jannifer Rodney, FNP

## 2015-01-11 NOTE — Patient Instructions (Signed)

## 2015-01-13 LAB — URINE CULTURE: ORGANISM ID, BACTERIA: NO GROWTH

## 2015-01-14 ENCOUNTER — Ambulatory Visit: Payer: Commercial Managed Care - HMO | Admitting: Nurse Practitioner

## 2015-01-20 ENCOUNTER — Encounter: Payer: Self-pay | Admitting: Internal Medicine

## 2015-03-27 ENCOUNTER — Ambulatory Visit (INDEPENDENT_AMBULATORY_CARE_PROVIDER_SITE_OTHER): Payer: Commercial Managed Care - HMO | Admitting: Nurse Practitioner

## 2015-03-27 ENCOUNTER — Encounter: Payer: Self-pay | Admitting: Nurse Practitioner

## 2015-03-27 VITALS — BP 144/91 | HR 82 | Temp 97.8°F | Ht 63.5 in | Wt 124.0 lb

## 2015-03-27 DIAGNOSIS — Z0000 Encounter for general adult medical examination without abnormal findings: Secondary | ICD-10-CM | POA: Diagnosis not present

## 2015-03-27 DIAGNOSIS — E785 Hyperlipidemia, unspecified: Secondary | ICD-10-CM

## 2015-03-27 DIAGNOSIS — F411 Generalized anxiety disorder: Secondary | ICD-10-CM

## 2015-03-27 DIAGNOSIS — K219 Gastro-esophageal reflux disease without esophagitis: Secondary | ICD-10-CM

## 2015-03-27 DIAGNOSIS — Z01419 Encounter for gynecological examination (general) (routine) without abnormal findings: Secondary | ICD-10-CM | POA: Diagnosis not present

## 2015-03-27 LAB — POCT URINALYSIS DIPSTICK
Bilirubin, UA: NEGATIVE
GLUCOSE UA: NEGATIVE
Ketones, UA: NEGATIVE
NITRITE UA: NEGATIVE
Protein, UA: NEGATIVE
RBC UA: NEGATIVE
Spec Grav, UA: >=1.03
Urobilinogen, UA: NEGATIVE
pH, UA: 7

## 2015-03-27 LAB — POCT UA - MICROSCOPIC ONLY
Casts, Ur, LPF, POC: NEGATIVE
Crystals, Ur, HPF, POC: NEGATIVE
MUCUS UA: NEGATIVE
Yeast, UA: NEGATIVE

## 2015-03-27 MED ORDER — ALPRAZOLAM 0.25 MG PO TABS: 0.2500 mg | ORAL_TABLET | Freq: Two times a day (BID) | ORAL | Status: DC | PRN

## 2015-03-27 NOTE — Progress Notes (Signed)
Subjective:    Patient ID: Courtney Johnson, female    DOB: July 06, 1945, 69 y.o.   MRN: [id number]  Patient here today for annual physical exam and follow up of chronic medical problems.  Hyperlipidemia This is a chronic problem. The current episode started more than 1 year ago. Recent lipid tests were reviewed and are variable. Pertinent negatives include no chest pain, myalgias or shortness of breath. She is currently on no antihyperlipidemic treatment. The current treatment provides moderate improvement of lipids. Compliance problems include adherence to diet and adherence to exercise.  Risk factors for coronary artery disease include dyslipidemia and post-menopausal.   GAD Only takes xanax when she is worried and cannot sleep-       Review of Systems  Respiratory: Negative for shortness of breath.   Cardiovascular: Negative for chest pain.  Musculoskeletal: Negative for myalgias.  All other systems reviewed and are negative.      Objective:   Physical Exam  Constitutional: She is oriented to person, place, and time. She appears well-developed and well-nourished.  HENT:  Head: Normocephalic.  Right Ear: Hearing, tympanic membrane, external ear and ear canal normal.  Left Ear: Hearing, tympanic membrane, external ear and ear canal normal.  Nose: Nose normal.  Mouth/Throat: Uvula is midline and oropharynx is clear and moist.  Eyes: Conjunctivae and EOM are normal. Pupils are equal, round, and reactive to light.  Neck: Trachea normal, normal range of motion and full passive range of motion without pain. Neck supple. No JVD present. Carotid bruit is not present. No thyroid mass and no thyromegaly present.  Cardiovascular: Normal rate, regular rhythm, normal heart sounds and intact distal pulses.  Exam reveals no gallop and no friction rub.   No murmur heard. Pulmonary/Chest: Effort normal and breath sounds normal. Right breast exhibits no inverted nipple, no mass, no nipple  discharge, no skin change and no tenderness. Left breast exhibits no inverted nipple, no mass, no nipple discharge, no skin change and no tenderness.  Abdominal: Soft. Bowel sounds are normal. She exhibits no distension and no mass. There is no tenderness.  Genitourinary: Vagina normal and uterus normal. No breast swelling, tenderness, discharge or bleeding. No vaginal discharge found.  bimanual exam-No adnexal masses or tenderness. Vaginal cuff intact   Musculoskeletal: Normal range of motion.  Lymphadenopathy:    She has no cervical adenopathy.  Neurological: She is alert and oriented to person, place, and time. She has normal reflexes.  Skin: Skin is warm and dry.  Psychiatric: She has a normal mood and affect. Her behavior is normal. Judgment and thought content normal.     BP 144/91 mmHg  Pulse 82  Temp(Src) 97.8 F (36.6 C) (Oral)  Ht 5' 3.5" (1.613 m)  Wt 124 lb (56.246 kg)  BMI 21.62 kg/m2        Assessment & Plan:  1. Annual physical exam  - POCT urinalysis dipstick - POCT UA - Microscopic Only - CBC with Differential/Platelet - Thyroid Panel With TSH  2. Encounter for routine gynecological examination - Pap IG (Image Guided)  3. Gastroesophageal reflux disease, esophagitis presence not specified Avoid spicy foods Do not eat 2 hours prior to bedtime  4. Hyperlipidemia Low fat diet - CMP14+EGFR - Lipid panel  5. GAD (generalized anxiety disorder) Stress management - ALPRAZolam (XANAX) 0.25 MG tablet; Take 1 tablet (0.25 mg total) by mouth 2 (two) times daily as needed for sleep.  Dispense: 60 tablet; Refill: 1    Labs pending Health maintenance  reviewed Diet and exercise encouraged Continue all meds Follow up  In 6 month   [place], FNP

## 2015-03-27 NOTE — Patient Instructions (Signed)

## 2015-03-28 LAB — CBC WITH DIFFERENTIAL/PLATELET
BASOS ABS: 0 10*3/uL (ref 0.0–0.2)
Basos: 1 %
EOS (ABSOLUTE): 0.1 10*3/uL (ref 0.0–0.4)
Eos: 1 %
Hematocrit: 41.8 % (ref 34.0–46.6)
Hemoglobin: 14.4 g/dL (ref 11.1–15.9)
Immature Grans (Abs): 0 10*3/uL (ref 0.0–0.1)
Immature Granulocytes: 0 %
LYMPHS ABS: 1.8 10*3/uL (ref 0.7–3.1)
Lymphs: 34 %
MCH: 31 pg (ref 26.6–33.0)
MCHC: 34.4 g/dL (ref 31.5–35.7)
MCV: 90 fL (ref 79–97)
MONOCYTES: 8 %
MONOS ABS: 0.4 10*3/uL (ref 0.1–0.9)
Neutrophils Absolute: 3 10*3/uL (ref 1.4–7.0)
Neutrophils: 56 %
PLATELETS: 294 10*3/uL (ref 150–379)
RBC: 4.65 x10E6/uL (ref 3.77–5.28)
RDW: 13.6 % (ref 12.3–15.4)
WBC: 5.2 10*3/uL (ref 3.4–10.8)

## 2015-03-28 LAB — CMP14+EGFR
ALK PHOS: 70 IU/L (ref 39–117)
ALT: 24 IU/L (ref 0–32)
AST: 28 IU/L (ref 0–40)
Albumin/Globulin Ratio: 2.1 (ref 1.1–2.5)
Albumin: 4.7 g/dL (ref 3.6–4.8)
BUN/Creatinine Ratio: 12 (ref 11–26)
BUN: 10 mg/dL (ref 8–27)
Bilirubin Total: 0.4 mg/dL (ref 0.0–1.2)
CO2: 24 mmol/L (ref 18–29)
CREATININE: 0.82 mg/dL (ref 0.57–1.00)
Calcium: 9.8 mg/dL (ref 8.7–10.3)
Chloride: 102 mmol/L (ref 97–108)
GFR calc Af Amer: 86 mL/min/{1.73_m2} (ref >59)
GFR calc non Af Amer: 74 mL/min/{1.73_m2} (ref >59)
GLUCOSE: 90 mg/dL (ref 65–99)
Globulin, Total: 2.2 g/dL (ref 1.5–4.5)
Potassium: 4.2 mmol/L (ref 3.5–5.2)
Sodium: 141 mmol/L (ref 134–144)
Total Protein: 6.9 g/dL (ref 6.0–8.5)

## 2015-03-28 LAB — LIPID PANEL
CHOLESTEROL TOTAL: 270 mg/dL — AB (ref 100–199)
Chol/HDL Ratio: 5.4 ratio units — ABNORMAL HIGH (ref 0.0–4.4)
HDL: 50 mg/dL (ref >39)
LDL CALC: 180 mg/dL — AB (ref 0–99)
TRIGLYCERIDES: 200 mg/dL — AB (ref 0–149)
VLDL CHOLESTEROL CAL: 40 mg/dL (ref 5–40)

## 2015-03-28 LAB — THYROID PANEL WITH TSH
Free Thyroxine Index: 2.5 (ref 1.2–4.9)
T3 UPTAKE RATIO: 25 % (ref 24–39)
T4, Total: 10 ug/dL (ref 4.5–12.0)
TSH: 2.16 u[IU]/mL (ref 0.450–4.500)

## 2015-03-31 LAB — PAP IG (IMAGE GUIDED): PAP SMEAR COMMENT: 0

## 2015-04-29 ENCOUNTER — Ambulatory Visit (INDEPENDENT_AMBULATORY_CARE_PROVIDER_SITE_OTHER): Payer: Commercial Managed Care - HMO

## 2015-04-29 DIAGNOSIS — Z23 Encounter for immunization: Secondary | ICD-10-CM | POA: Diagnosis not present

## 2015-04-29 NOTE — Progress Notes (Signed)
Pt here for fluarix and pneumovax 23 and tolerated well

## 2015-05-05 ENCOUNTER — Telehealth: Payer: Self-pay | Admitting: Nurse Practitioner

## 2015-05-05 MED ORDER — CIPROFLOXACIN HCL 500 MG PO TABS: 500.0000 mg | ORAL_TABLET | Freq: Two times a day (BID) | ORAL | Status: DC

## 2015-05-05 NOTE — Telephone Encounter (Signed)
cipro rx sent to pharmacy. 

## 2015-05-05 NOTE — Telephone Encounter (Signed)
Pt aware Rx sent to pharmacy 

## 2015-06-17 ENCOUNTER — Ambulatory Visit (INDEPENDENT_AMBULATORY_CARE_PROVIDER_SITE_OTHER): Payer: Commercial Managed Care - HMO | Admitting: Nurse Practitioner

## 2015-06-17 ENCOUNTER — Encounter: Payer: Self-pay | Admitting: Nurse Practitioner

## 2015-06-17 VITALS — BP 152/96 | HR 94 | Temp 97.1°F | Ht 63.0 in | Wt 129.0 lb

## 2015-06-17 DIAGNOSIS — R300 Dysuria: Secondary | ICD-10-CM | POA: Diagnosis not present

## 2015-06-17 DIAGNOSIS — N3000 Acute cystitis without hematuria: Secondary | ICD-10-CM

## 2015-06-17 LAB — POCT UA - MICROSCOPIC ONLY
Bacteria, U Microscopic: NEGATIVE
Casts, Ur, LPF, POC: NEGATIVE
Crystals, Ur, HPF, POC: NEGATIVE
MUCUS UA: NEGATIVE
Yeast, UA: NEGATIVE

## 2015-06-17 LAB — POCT URINALYSIS DIPSTICK
Bilirubin, UA: NEGATIVE
Glucose, UA: NEGATIVE
Ketones, UA: NEGATIVE
NITRITE UA: NEGATIVE
PH UA: 6
PROTEIN UA: NEGATIVE
Spec Grav, UA: 1.01
Urobilinogen, UA: NEGATIVE

## 2015-06-17 MED ORDER — CIPROFLOXACIN HCL 500 MG PO TABS: 500.0000 mg | ORAL_TABLET | Freq: Two times a day (BID) | ORAL | Status: DC

## 2015-06-17 NOTE — Patient Instructions (Signed)
Asymptomatic Bacteriuria, Female Asymptomatic bacteriuria is the presence of a large number of bacteria in your urine without the usual symptoms of burning or frequent urination. The following conditions increase the risk of asymptomatic bacteriuria:  Diabetes mellitus.  Advanced age.  Pregnancy in the first trimester.  Kidney stones.  Kidney transplants.  Leaky kidney tube valve in young children (reflux). Treatment for this condition is not needed in most people and can lead to other problems such as too much yeast and growth of resistant bacteria. However, some people, such as pregnant women, do need treatment to prevent kidney infection. Asymptomatic bacteriuria in pregnancy is also associated with fetal growth restriction, premature labor, and newborn death. HOME CARE INSTRUCTIONS Monitor your condition for any changes. The following actions may help to relieve any discomfort you are feeling:  Drink enough water and fluids to keep your urine clear or pale yellow. Go to the bathroom more often to keep your bladder empty.  Keep the area around your vagina and rectum clean. Wipe yourself from front to back after urinating. SEEK IMMEDIATE MEDICAL CARE IF:  You develop signs of an infection such as:  Burning with urination.  Frequency of voiding.  Back pain.  Fever.  You have blood in the urine.  You develop a fever. MAKE SURE YOU:  Understand these instructions.  Will watch your condition.  Will get help right away if you are not doing well or get worse.   This information is not intended to replace advice given to you by your health care provider. Make sure you discuss any questions you have with your health care provider.   Document Released: 06/07/2005 Document Revised: 06/28/2014 Document Reviewed: 11/27/2012 Elsevier Interactive Patient Education 2016 [employer]  

## 2015-06-17 NOTE — Progress Notes (Signed)
  Subjective:    Judeth Cornfieldatricia A Frosch is a 69 y.o. female who complains of dysuria, frequency and urgency. She has had symptoms for 2 days. Patient also complains of no other symptoms. Patient denies back pain. Patient does not have a history of recurrent UTI. Patient does not have a history of pyelonephritis.   The following portions of the patient's history were reviewed and updated as appropriate: allergies, current medications, past family history, past medical history, past social history, past surgical history and problem list.  Review of Systems Pertinent items are noted in HPI.    Objective:    BP 152/96 mmHg  Pulse 94  Temp(Src) 97.1 F (36.2 C) (Oral)  Ht 5\' 3"  (1.6 m)  Wt 129 lb (58.514 kg)  BMI 22.86 kg/m2 Lungs: clear to auscultation bilaterally Heart: regular rate and rhythm, S1, S2 normal, no murmur, click, rub or gallop Abdomen: soft, non-tender; bowel sounds normal; no masses,  no organomegaly Pelvic: no bladder tenderness  Laboratory:  Results for orders placed or performed in visit on 06/17/15  POCT urinalysis dipstick  Result Value Ref Range   Color, UA yellow    Clarity, UA clear    Glucose, UA neg    Bilirubin, UA neg    Ketones, UA neg    Spec Grav, UA 1.010    Blood, UA trace    pH, UA 6.0    Protein, UA neg    Urobilinogen, UA negative    Nitrite, UA neg    Leukocytes, UA Trace (A) Negative  POCT UA - Microscopic Only  Result Value Ref Range   WBC, Ur, HPF, POC 10-15    RBC, urine, microscopic 1-5    Bacteria, U Microscopic neg    Mucus, UA neg    Epithelial cells, urine per micros occ    Crystals, Ur, HPF, POC neg    Casts, Ur, LPF, POC neg    Yeast, UA neg      Assessment:    Acute cystitis and UTI     Plan:  1. Dysuria  - POCT urinalysis dipstick - POCT UA - Microscopic Only  2. Acute cystitis without hematuria Take medication as prescribe Cotton underwear Take shower not bath Cranberry juice, yogurt Force fluids AZO over the  counter X2 days Culture pending RTO prn  - ciprofloxacin (CIPRO) 500 MG tablet; Take 1 tablet (500 mg total) by mouth 2 (two) times daily.  Dispense: 6 tablet; Refill: 0  Mary-Margaret Daphine DeutscherMartin, FNP

## 2015-06-19 LAB — URINE CULTURE

## 2015-06-24 ENCOUNTER — Telehealth: Payer: Self-pay | Admitting: Nurse Practitioner

## 2015-06-24 ENCOUNTER — Other Ambulatory Visit: Payer: Self-pay | Admitting: Nurse Practitioner

## 2015-06-24 DIAGNOSIS — N3000 Acute cystitis without hematuria: Secondary | ICD-10-CM

## 2015-06-24 MED ORDER — CIPROFLOXACIN HCL 500 MG PO TABS: 500.0000 mg | ORAL_TABLET | Freq: Two times a day (BID) | ORAL | Status: DC

## 2015-06-24 NOTE — Telephone Encounter (Signed)
Sent in another refill on cipro- not sure it will help- drink lots of liquids and try AZO OTC

## 2015-06-24 NOTE — Telephone Encounter (Signed)
Patient aware that antibiotic called in and she should increase fluids & add AZO otc

## 2015-10-20 DIAGNOSIS — Z1231 Encounter for screening mammogram for malignant neoplasm of breast: Secondary | ICD-10-CM | POA: Diagnosis not present

## 2016-01-22 ENCOUNTER — Ambulatory Visit (INDEPENDENT_AMBULATORY_CARE_PROVIDER_SITE_OTHER): Payer: Commercial Managed Care - HMO | Admitting: Family Medicine

## 2016-01-22 ENCOUNTER — Encounter: Payer: Self-pay | Admitting: Family Medicine

## 2016-01-22 VITALS — BP 145/86 | HR 111 | Temp 97.7°F | Ht 63.0 in | Wt 129.0 lb

## 2016-01-22 DIAGNOSIS — K219 Gastro-esophageal reflux disease without esophagitis: Secondary | ICD-10-CM | POA: Diagnosis not present

## 2016-01-22 DIAGNOSIS — R1084 Generalized abdominal pain: Secondary | ICD-10-CM

## 2016-01-22 DIAGNOSIS — H6523 Chronic serous otitis media, bilateral: Secondary | ICD-10-CM

## 2016-01-22 MED ORDER — DEXLANSOPRAZOLE 60 MG PO CPDR: 60.0000 mg | DELAYED_RELEASE_CAPSULE | Freq: Every day | ORAL | 6 refills | Status: DC

## 2016-01-22 MED ORDER — AMOXICILLIN 500 MG PO CAPS: 500.0000 mg | ORAL_CAPSULE | Freq: Three times a day (TID) | ORAL | 0 refills | Status: DC

## 2016-01-22 MED ORDER — FLUTICASONE PROPIONATE 50 MCG/ACT NA SUSP: 1.0000 | Freq: Every day | NASAL | 2 refills | Status: DC

## 2016-01-22 NOTE — Progress Notes (Addendum)
   Subjective:    Patient ID: Courtney Johnson, female    DOB: [birth year], 70 y.o.   MRN: [id number]  HPI Patient here today for abdominal pains and sinus issues. There is a chronic history of pain. She had been seen previously by the GI doctors in [place] gave her Prilosec. That no longer seems to be effective. Sounds like she may have also been treated for H. pylori because she had a positive blood test. She cannot relate the symptoms to any particular food. She admits to drinking a fair amount of caffeine. There is been no change in her stool or bowel habits. She does have some nighttime symptoms. She does have some water brash.  She also complains of some sinus pressure and feeling that her ears are stopped up.  Patient Active Problem List   Diagnosis Date Noted  . Osteopenia 04/24/2014  . Bloating 04/10/2014  . Abdominal pain 04/10/2014  . GERD (gastroesophageal reflux disease) 04/10/2014  . Hyperlipidemia 06/23/2013  . Generalized anxiety disorder 06/23/2013  . Family history of colon cancer - sister - late 92's 04/26/2013  . RECTAL BLEEDING 10/23/2007   Outpatient Encounter Prescriptions as of 01/22/2016  Medication Sig  . ALPRAZolam (XANAX) 0.25 MG tablet Take 1 tablet (0.25 mg total) by mouth 2 (two) times daily as needed for sleep.  Marland Kitchen aspirin 81 MG tablet Take 81 mg by mouth daily.  . Multiple Vitamins-Minerals (CENTRUM ADULTS PO) Take 1 tablet by mouth daily.  Marland Kitchen omeprazole (PRILOSEC) 20 MG capsule Take 1 capsule (20 mg total) by mouth daily.  . Vitamins A & D 5000-400 UNITS CAPS   . [DISCONTINUED] ciprofloxacin (CIPRO) 500 MG tablet Take 1 tablet (500 mg total) by mouth 2 (two) times daily.   No facility-administered encounter medications on file as of 01/22/2016.      Review of Systems  Constitutional: Negative.   HENT: Positive for ear pain ("stopped up") and sinus pressure.   Eyes: Negative.   Respiratory: Negative.   Cardiovascular: Negative.   Gastrointestinal:  Positive for abdominal pain (upper and lower abdomen - normal BM).  Endocrine: Negative.   Genitourinary: Negative.   Musculoskeletal: Negative.   Skin: Negative.   Allergic/Immunologic: Negative.   Neurological: Negative.   Hematological: Negative.   Psychiatric/Behavioral: Negative.        Objective:   Physical Exam  Constitutional: She appears well-developed and well-nourished.  HENT:  TMs are dull and did not move with Valsalva maneuver  Cardiovascular: Normal rate, regular rhythm and normal heart sounds.   Pulmonary/Chest: Effort normal and breath sounds normal.  Abdominal: Soft. There is no tenderness.    BP (!) 145/86 (BP Location: Left Arm)   Pulse (!) 111   Temp 97.7 F (36.5 C) (Oral)   Ht 5\' 3"  (1.6 m)   Wt 129 lb (58.5 kg)   BMI 22.85 kg/m        Assessment & Plan:  1. Gastroesophageal reflux disease, esophagitis presence not specified We'll try on Dexilant for 1-2 weeks. If symptoms are not improved would like to refer her to GI for possible endoscopy  2. Generalized abdominal pain I believe this pain is related to the above symptoms  3. Bilateral chronic serous otitis media Possibly has sinus infection as well. Will treat with amoxicillin and Flonase and ear exercises  Frederica Kuster MD In the first point above I did not say Exelon but rather Dexilant. This was an error of dictation

## 2016-01-23 ENCOUNTER — Telehealth: Payer: Self-pay | Admitting: Nurse Practitioner

## 2016-01-23 NOTE — Telephone Encounter (Signed)
I thought we had checked to see if that was a covered medicine but if it is not but try Protonix 40 mg daily #15

## 2016-01-28 MED ORDER — PANTOPRAZOLE SODIUM 40 MG PO TBEC: 40.0000 mg | DELAYED_RELEASE_TABLET | Freq: Every day | ORAL | 0 refills | Status: DC

## 2016-01-28 NOTE — Telephone Encounter (Signed)
rx sent to the pharmacy and pt is aware. 

## 2016-04-08 ENCOUNTER — Ambulatory Visit (INDEPENDENT_AMBULATORY_CARE_PROVIDER_SITE_OTHER): Payer: Commercial Managed Care - HMO | Admitting: Nurse Practitioner

## 2016-04-08 ENCOUNTER — Encounter: Payer: Self-pay | Admitting: Nurse Practitioner

## 2016-04-08 VITALS — BP 132/88 | HR 82 | Temp 98.5°F | Ht 63.0 in | Wt 128.0 lb

## 2016-04-08 DIAGNOSIS — F411 Generalized anxiety disorder: Secondary | ICD-10-CM | POA: Diagnosis not present

## 2016-04-08 DIAGNOSIS — R300 Dysuria: Secondary | ICD-10-CM | POA: Diagnosis not present

## 2016-04-08 DIAGNOSIS — J3089 Other allergic rhinitis: Secondary | ICD-10-CM

## 2016-04-08 DIAGNOSIS — K219 Gastro-esophageal reflux disease without esophagitis: Secondary | ICD-10-CM

## 2016-04-08 DIAGNOSIS — M8580 Other specified disorders of bone density and structure, unspecified site: Secondary | ICD-10-CM

## 2016-04-08 DIAGNOSIS — E785 Hyperlipidemia, unspecified: Secondary | ICD-10-CM | POA: Diagnosis not present

## 2016-04-08 LAB — URINALYSIS, COMPLETE
Bilirubin, UA: NEGATIVE
GLUCOSE, UA: NEGATIVE
Ketones, UA: NEGATIVE
Leukocytes, UA: NEGATIVE
NITRITE UA: NEGATIVE
Protein, UA: NEGATIVE
RBC, UA: NEGATIVE
Specific Gravity, UA: 1.01 (ref 1.005–1.030)
Urobilinogen, Ur: 0.2 mg/dL (ref 0.2–1.0)
pH, UA: 7 (ref 5.0–7.5)

## 2016-04-08 LAB — MICROSCOPIC EXAMINATION
EPITHELIAL CELLS (NON RENAL): NONE SEEN /HPF (ref 0–10)
RBC, UA: NONE SEEN /hpf (ref 0–?)
WBC, UA: NONE SEEN /hpf (ref 0–?)

## 2016-04-08 MED ORDER — LORATADINE 10 MG PO TABS: 10.0000 mg | ORAL_TABLET | Freq: Every day | ORAL | 3 refills | Status: DC

## 2016-04-08 MED ORDER — SIMVASTATIN 40 MG PO TABS: 40.0000 mg | ORAL_TABLET | Freq: Every day | ORAL | 3 refills | Status: DC

## 2016-04-08 MED ORDER — ALPRAZOLAM 0.25 MG PO TABS: 0.2500 mg | ORAL_TABLET | Freq: Two times a day (BID) | ORAL | 1 refills | Status: DC | PRN

## 2016-04-08 MED ORDER — OMEPRAZOLE 20 MG PO CPDR: 20.0000 mg | DELAYED_RELEASE_CAPSULE | Freq: Every day | ORAL | 0 refills | Status: DC

## 2016-04-08 NOTE — Patient Instructions (Signed)
Stress and Stress Management Stress is a normal reaction to life events. It is what you feel when life demands more than you are used to or more than you can handle. Some stress can be useful. For example, the stress reaction can help you catch the last bus of the day, study for a test, or meet a deadline at work. But stress that occurs too often or for too long can cause problems. It can affect your emotional health and interfere with relationships and normal daily activities. Too much stress can weaken your immune system and increase your risk for physical illness. If you already have a medical problem, stress can make it worse. CAUSES  All sorts of life events may cause stress. An event that causes stress for one person may not be stressful for another person. Major life events commonly cause stress. These may be positive or negative. Examples include losing your job, moving into a new home, getting married, having a baby, or losing a loved one. Less obvious life events may also cause stress, especially if they occur day after day or in combination. Examples include working long hours, driving in traffic, caring for children, being in debt, or being in a difficult relationship. SIGNS AND SYMPTOMS Stress may cause emotional symptoms including, the following:  Anxiety. This is feeling worried, afraid, on edge, overwhelmed, or out of control.  Anger. This is feeling irritated or impatient.  Depression. This is feeling sad, down, helpless, or guilty.  Difficulty focusing, remembering, or making decisions. Stress may cause physical symptoms, including the following:   Aches and pains. These may affect your head, neck, back, stomach, or other areas of your body.  Tight muscles or clenched jaw.  Low energy or trouble sleeping. Stress may cause unhealthy behaviors, including the following:   Eating to feel better (overeating) or skipping meals.  Sleeping too little, too much, or both.  Working  too much or putting off tasks (procrastination).  Smoking, drinking alcohol, or using drugs to feel better. DIAGNOSIS  Stress is diagnosed through an assessment by your health care provider. Your health care provider will ask questions about your symptoms and any stressful life events.Your health care provider will also ask about your medical history and may order blood tests or other tests. Certain medical conditions and medicine can cause physical symptoms similar to stress. Mental illness can cause emotional symptoms and unhealthy behaviors similar to stress. Your health care provider may refer you to a mental health professional for further evaluation.  TREATMENT  Stress management is the recommended treatment for stress.The goals of stress management are reducing stressful life events and coping with stress in healthy ways.  Techniques for reducing stressful life events include the following:  Stress identification. Self-monitor for stress and identify what causes stress for you. These skills may help you to avoid some stressful events.  Time management. Set your priorities, keep a calendar of events, and learn to say "no." These tools can help you avoid making too many commitments. Techniques for coping with stress include the following:  Rethinking the problem. Try to think realistically about stressful events rather than ignoring them or overreacting. Try to find the positives in a stressful situation rather than focusing on the negatives.  Exercise. Physical exercise can release both physical and emotional tension. The key is to find a form of exercise you enjoy and do it regularly.  Relaxation techniques. These relax the body and mind. Examples include yoga, meditation, tai chi, biofeedback, deep  breathing, progressive muscle relaxation, listening to music, being out in nature, journaling, and other hobbies. Again, the key is to find one or more that you enjoy and can do  regularly.  Healthy lifestyle. Eat a balanced diet, get plenty of sleep, and do not smoke. Avoid using alcohol or drugs to relax.  Strong support network. Spend time with family, friends, or other people you enjoy being around.Express your feelings and talk things over with someone you trust. Counseling or talktherapy with a mental health professional may be helpful if you are having difficulty managing stress on your own. Medicine is typically not recommended for the treatment of stress.Talk to your health care provider if you think you need medicine for symptoms of stress. HOME CARE INSTRUCTIONS  Keep all follow-up visits as directed by your health care provider.  Take all medicines as directed by your health care provider. SEEK MEDICAL CARE IF:  Your symptoms get worse or you start having new symptoms.  You feel overwhelmed by your problems and can no longer manage them on your own. SEEK IMMEDIATE MEDICAL CARE IF:  You feel like hurting yourself or someone else.   This information is not intended to replace advice given to you by your health care provider. Make sure you discuss any questions you have with your health care provider.   Document Released: 12/01/2000 Document Revised: 06/28/2014 Document Reviewed: 01/30/2013 Elsevier Interactive Patient Education 2016 Else[employer] High Cholesterol High cholesterol refers to having a high level of cholesterol in your blood. Cholesterol is a white, waxy, fat-like protein that your body needs in small amounts. Your liver makes all the cholesterol you need. Excess cholesterol comes from the food you eat. Cholesterol travels in your bloodstream through your blood vessels. If you have high cholesterol, deposits (plaque) may build up on the walls of your blood vessels. This makes the arteries narrower and stiffer. Plaque increases your risk of heart attack and stroke. Work with your health care provider to keep your cholesterol levels in a  healthy range. RISK FACTORS Several things can make you more likely to have high cholesterol. These include:   Eating foods high in animal fat (saturated fat) or cholesterol.  Being overweight.  Not getting enough exercise.  Having a family history of high cholesterol. SIGNS AND SYMPTOMS High cholesterol does not cause symptoms. DIAGNOSIS  Your health care provider can do a blood test to check whether you have high cholesterol. If you are older than 20, your health care provider may check your cholesterol every 4-6 years. You may be checked more often if you already have high cholesterol or other risk factors for heart disease. The blood test for cholesterol measures the following:  Bad cholesterol (LDL cholesterol). This is the type of cholesterol that causes heart disease. This number should be less than 100.  Good cholesterol (HDL cholesterol). This type helps protect against heart disease. A healthy level of HDL cholesterol is 60 or higher.  Total cholesterol. This is the combined number of LDL cholesterol and HDL cholesterol. A healthy number is less than 200. TREATMENT  High cholesterol can be treated with diet changes, lifestyle changes, and medicine.   Diet changes may include eating more whole grains, fruits, vegetables, nuts, and fish. You may also have to cut back on red meat and foods with a lot of added sugar.  Lifestyle changes may include getting at least 40 minutes of aerobic exercise three times a week. Aerobic exercises include walking, biking, and swimming. Aerobic   exercise along with a healthy diet can help you maintain a healthy weight. Lifestyle changes may also include quitting smoking.  If diet and lifestyle changes are not enough to lower your cholesterol, your health care provider may prescribe a statin medicine. This medicine has been shown to lower cholesterol and also lower the risk of heart disease. HOME CARE INSTRUCTIONS  Only take over-the-counter or  prescription medicines as directed by your health care provider.   Follow a healthy diet as directed by your health care provider. For instance:   Eat chicken (without skin), fish, veal, shellfish, ground turkey breast, and round or loin cuts of red meat.  Do not eat fried foods and fatty meats, such as hot dogs and salami.   Eat plenty of fruits, such as apples.   Eat plenty of vegetables, such as broccoli, potatoes, and carrots.   Eat beans, peas, and lentils.   Eat grains, such as barley, rice, couscous, and bulgur wheat.   Eat pasta without cream sauces.   Use skim or nonfat milk and low-fat or nonfat yogurt and cheeses. Do not eat or drink whole milk, cream, ice cream, egg yolks, and hard cheeses.   Do not eat stick margarine or tub margarines that contain trans fats (also called partially hydrogenated oils).   Do not eat cakes, cookies, crackers, or other baked goods that contain trans fats.   Do not eat saturated tropical oils, such as coconut and palm oil.   Exercise as directed by your health care provider. Increase your activity level with activities such as gardening or walking.   Keep all follow-up appointments.  SEEK MEDICAL CARE IF:  You are struggling to maintain a healthy diet or weight.  You need help starting an exercise program.  You need help to stop smoking. SEEK IMMEDIATE MEDICAL CARE IF:  You have chest pain.  You have trouble breathing.   This information is not intended to replace advice given to you by your health care provider. Make sure you discuss any questions you have with your health care provider.   Document Released: 06/07/2005 Document Revised: 06/28/2014 Document Reviewed: 03/30/2013 Elsevier Interactive Patient Education 2016 Elsevier Inc.  

## 2016-04-08 NOTE — Progress Notes (Signed)
Subjective:    Patient ID: Courtney Johnson, female    DOB: 06-Jan-1946, 70 y.o.   MRN: [id number]  Patient here today for a follow-up of chronic medical problems. Her additional complaints today is burning upon urination.  Hyperlipidemia  This is a chronic problem. The current episode started more than 1 year ago. Recent lipid tests were reviewed and are variable. Pertinent negatives include no chest pain, myalgias or shortness of breath. Current antihyperlipidemic treatment includes statins. The current treatment provides moderate improvement of lipids. Compliance problems include adherence to diet and adherence to exercise.  Risk factors for coronary artery disease include dyslipidemia, post-menopausal and stress.  Anxiety  Presents for follow-up visit. Symptoms include depressed mood, dizziness, excessive worry, hyperventilation, insomnia, irritability, muscle tension, nervous/anxious behavior and panic. Patient reports no chest pain, confusion, decreased concentration, dry mouth, feeling of choking, impotence, malaise, nausea, obsessions, palpitations, restlessness, shortness of breath or suicidal ideas. Symptoms occur occasionally. The severity of symptoms is moderate. The patient sleeps 6 hours per night. The quality of sleep is poor. Nighttime awakenings: several.   Compliance with medications is 76-100%.  Gastroesophageal Reflux  She complains of abdominal pain, belching, early satiety and heartburn. She reports no chest pain, no choking, no coughing, no dysphagia, no globus sensation, no hoarse voice, no nausea, no sore throat, no stridor, no tooth decay, no water brash or no wheezing. This is a chronic problem. The current episode started more than 1 year ago. The problem occurs frequently. The problem has been waxing and waning. The heartburn duration is an hour. The heartburn is located in the substernum and abdomen. The heartburn is of moderate intensity. The heartburn does not wake her  from sleep. The heartburn does not limit her activity. The heartburn doesn't change with position. The symptoms are aggravated by certain foods and stress. Pertinent negatives include no anemia, fatigue, melena, muscle weakness, orthopnea or weight loss. Risk factors include caffeine use. She has tried a diet change and a PPI for the symptoms. The treatment provided mild relief.  Dysuria   This is a new problem. The current episode started in the past 7 days. The problem occurs intermittently. The problem has been unchanged. The quality of the pain is described as burning. There has been no fever. Associated symptoms include flank pain, frequency, hesitancy and urgency. Pertinent negatives include no chills, discharge, hematuria, nausea, possible pregnancy, sweats or vomiting. She has tried increased fluids for the symptoms. The treatment provided mild relief. Her past medical history is significant for recurrent UTIs. There is no history of catheterization, kidney stones, a single kidney, urinary stasis or a urological procedure.   Allergies: patient voices concern about her seasonal allergies, requesting something to help.   Outpatient Encounter Prescriptions as of 04/08/2016  Medication Sig Note  . ALPRAZolam (XANAX) 0.25 MG tablet Take 1 tablet (0.25 mg total) by mouth 2 (two) times daily as needed for sleep.   Marland Kitchen aspirin 81 MG tablet Take 81 mg by mouth daily.   . fluticasone (FLONASE) 50 MCG/ACT nasal spray Place 1 spray into both nostrils daily.   . Multiple Vitamins-Minerals (CENTRUM ADULTS PO) Take 1 tablet by mouth daily.   Marland Kitchen omeprazole (PRILOSEC) 20 MG capsule Take 1 capsule (20 mg total) by mouth daily.   . Vitamins A & D 5000-400 UNITS CAPS  01/30/2014: Received from: External Pharmacy  . [DISCONTINUED] ALPRAZolam (XANAX) 0.25 MG tablet Take 1 tablet (0.25 mg total) by mouth 2 (two) times daily as needed for  sleep.   . [DISCONTINUED] dexlansoprazole (DEXILANT) 60 MG capsule Take 1 capsule  (60 mg total) by mouth daily.   . [DISCONTINUED] omeprazole (PRILOSEC) 20 MG capsule Take 1 capsule (20 mg total) by mouth daily. 01/22/2016: OTC   . [DISCONTINUED] pantoprazole (PROTONIX) 40 MG tablet Take 1 tablet (40 mg total) by mouth daily.   Marland Kitchen loratadine (CLARITIN) 10 MG tablet Take 1 tablet (10 mg total) by mouth daily.   . simvastatin (ZOCOR) 40 MG tablet Take 1 tablet (40 mg total) by mouth daily.   . [DISCONTINUED] amoxicillin (AMOXIL) 500 MG capsule Take 1 capsule (500 mg total) by mouth 3 (three) times daily.    No facility-administered encounter medications on file as of 04/08/2016.        Review of Systems  Constitutional: Positive for irritability. Negative for chills, fatigue and weight loss.  HENT: Negative for hoarse voice and sore throat.   Respiratory: Negative for cough, choking, shortness of breath and wheezing.   Cardiovascular: Negative for chest pain and palpitations.  Gastrointestinal: Positive for abdominal pain and heartburn. Negative for dysphagia, melena, nausea and vomiting.  Genitourinary: Positive for dysuria, flank pain, frequency, hesitancy and urgency. Negative for hematuria and impotence.  Musculoskeletal: Negative for myalgias and muscle weakness.  Neurological: Positive for dizziness.  Psychiatric/Behavioral: Negative for confusion, decreased concentration and suicidal ideas. The patient is nervous/anxious and has insomnia.   All other systems reviewed and are negative.      Objective:   Physical Exam  Constitutional: She is oriented to person, place, and time. She appears well-developed and well-nourished.  HENT:  Head: Normocephalic.  Right Ear: Hearing, tympanic membrane, external ear and ear canal normal.  Left Ear: Hearing, tympanic membrane, external ear and ear canal normal.  Nose: Nose normal.  Mouth/Throat: Uvula is midline and oropharynx is clear and moist.  Eyes: Conjunctivae and EOM are normal. Pupils are equal, round, and reactive  to light.  Neck: Trachea normal, normal range of motion and full passive range of motion without pain. Neck supple. No JVD present. Carotid bruit is not present. No thyroid mass and no thyromegaly present.  Cardiovascular: Normal rate, regular rhythm, normal heart sounds and intact distal pulses.  Exam reveals no gallop and no friction rub.   No murmur heard. Pulmonary/Chest: Effort normal and breath sounds normal. Right breast exhibits no inverted nipple, no mass, no nipple discharge, no skin change and no tenderness. Left breast exhibits no inverted nipple, no mass, no nipple discharge, no skin change and no tenderness.  Abdominal: Soft. Bowel sounds are normal. She exhibits no distension and no mass. There is no tenderness.  Genitourinary: No breast swelling, tenderness, discharge or bleeding. No vaginal discharge found.  Musculoskeletal: Normal range of motion.  Neurological: She is alert and oriented to person, place, and time. She has normal reflexes.  Skin: Skin is warm and dry.  Psychiatric: She has a normal mood and affect. Her behavior is normal. Judgment and thought content normal.     BP 132/88 (BP Location: Left Arm, Cuff Size: Normal)   Pulse 82   Temp 98.5 F (36.9 C) (Oral)   Ht 5' 3" (1.6 m)   Wt 128 lb (58.1 kg)   BMI 22.67 kg/m          Assessment & Plan:  1. Dysuria Drinks lots of water. - Urinalysis, Complete (-)  2. Gastroesophageal reflux disease, esophagitis presence not specified Avoid spicy foods Do not eat 2 hours prior to bedtime - omeprazole (  PRILOSEC) 20 MG capsule; Take 1 capsule (20 mg total) by mouth daily.  Dispense: 30 capsule; Refill: 0  3. Osteopenia, unspecified location Continue vitamin D and weight bearing exercises.  4. Hyperlipidemia, unspecified hyperlipidemia type Avoid fatty foods. - simvastatin (ZOCOR) 40 MG tablet; Take 1 tablet (40 mg total) by mouth daily.  Dispense: 30 tablet; Refill: 3 - Lipid panel - CMP14+EGFR  5.  Generalized anxiety disorder Stress management.  6. Environmental and seasonal allergies - loratadine (CLARITIN) 10 MG tablet; Take 1 tablet (10 mg total) by mouth daily.  Dispense: 30 tablet; Refill: 3  7. GAD (generalized anxiety disorder) - ALPRAZolam (XANAX) 0.25 MG tablet; Take 1 tablet (0.25 mg total) by mouth 2 (two) times daily as needed for sleep.  Dispense: 60 tablet; Refill: 1   Education provided on potential side effects and adverse reactions of all prescription medications.  Labs pending Health maintenance reviewed Diet and exercise encouraged Continue all meds Follow up  In 3 months  Hendricks Limes, BSN-RN/FNP student  [place], [place]

## 2016-04-09 LAB — CMP14+EGFR
A/G RATIO: 1.7 (ref 1.2–2.2)
ALK PHOS: 77 IU/L (ref 39–117)
ALT: 21 IU/L (ref 0–32)
AST: 26 IU/L (ref 0–40)
Albumin: 4.6 g/dL (ref 3.6–4.8)
BILIRUBIN TOTAL: 0.3 mg/dL (ref 0.0–1.2)
BUN / CREAT RATIO: 11 — AB (ref 12–28)
BUN: 9 mg/dL (ref 8–27)
CHLORIDE: 103 mmol/L (ref 96–106)
CO2: 23 mmol/L (ref 18–29)
Calcium: 9.8 mg/dL (ref 8.7–10.3)
Creatinine, Ser: 0.81 mg/dL (ref 0.57–1.00)
GFR calc non Af Amer: 75 mL/min/{1.73_m2} (ref >59)
GFR, EST AFRICAN AMERICAN: 86 mL/min/{1.73_m2} (ref >59)
Globulin, Total: 2.7 g/dL (ref 1.5–4.5)
Glucose: 93 mg/dL (ref 65–99)
POTASSIUM: 4.4 mmol/L (ref 3.5–5.2)
Sodium: 143 mmol/L (ref 134–144)
TOTAL PROTEIN: 7.3 g/dL (ref 6.0–8.5)

## 2016-04-09 LAB — LIPID PANEL
CHOL/HDL RATIO: 4.9 ratio — AB (ref 0.0–4.4)
Cholesterol, Total: 282 mg/dL — ABNORMAL HIGH (ref 100–199)
HDL: 58 mg/dL (ref >39)
LDL CALC: 183 mg/dL — AB (ref 0–99)
TRIGLYCERIDES: 207 mg/dL — AB (ref 0–149)
VLDL CHOLESTEROL CAL: 41 mg/dL — AB (ref 5–40)

## 2016-04-28 ENCOUNTER — Ambulatory Visit (INDEPENDENT_AMBULATORY_CARE_PROVIDER_SITE_OTHER): Payer: Commercial Managed Care - HMO

## 2016-04-28 DIAGNOSIS — Z23 Encounter for immunization: Secondary | ICD-10-CM | POA: Diagnosis not present

## 2016-06-18 ENCOUNTER — Ambulatory Visit (INDEPENDENT_AMBULATORY_CARE_PROVIDER_SITE_OTHER): Payer: Commercial Managed Care - HMO | Admitting: Nurse Practitioner

## 2016-06-18 ENCOUNTER — Encounter: Payer: Self-pay | Admitting: Nurse Practitioner

## 2016-06-18 VITALS — BP 155/91 | HR 88 | Temp 97.3°F | Ht 63.0 in | Wt 131.0 lb

## 2016-06-18 DIAGNOSIS — R300 Dysuria: Secondary | ICD-10-CM | POA: Diagnosis not present

## 2016-06-18 LAB — URINALYSIS, COMPLETE
Bilirubin, UA: NEGATIVE
GLUCOSE, UA: NEGATIVE
Ketones, UA: NEGATIVE
Nitrite, UA: NEGATIVE
PROTEIN UA: NEGATIVE
RBC, UA: NEGATIVE
Specific Gravity, UA: 1.01 (ref 1.005–1.030)
UUROB: 0.2 mg/dL (ref 0.2–1.0)
pH, UA: 7 (ref 5.0–7.5)

## 2016-06-18 LAB — MICROSCOPIC EXAMINATION

## 2016-06-18 NOTE — Progress Notes (Signed)
Subjective:    Courtney Johnson is a 70 y.o. female who complains of burning with urination, frequency and pain pressure feelin in suprapubic area.. She has had symptoms for 2 days. Patient also complains of nothing else. Patient denies stomach ache. Patient does have a history of recurrent UTI. Patient does not have a history of pyelonephritis.   The following portions of the patient's history were reviewed and updated as appropriate: allergies, current medications, past family history, past medical history, past social history, past surgical history and problem list.  Review of Systems Pertinent items noted in HPI and remainder of comprehensive ROS otherwise negative.    Objective:    BP (!) 155/91   Pulse 88   Temp 97.3 F (36.3 C) (Oral)   Ht 5\' 3"  (1.6 m)   Wt 131 lb (59.4 kg)   BMI 23.21 kg/m  Lungs: clear to auscultation bilaterally Heart: regular rate and rhythm, S1, S2 normal, no murmur, click, rub or gallop Abdomen: soft, non-tender; bowel sounds normal; no masses,  no organomegaly  Laboratory:  Urine dipstick: negative for all components.   Micro exam: negative for WBCs or RBCs.    Assessment:    Acute cystitis and dysuria     Plan:    force fluids- cranberry juice RTO prn  Mary-Margaret Daphine DeutscherMartin, FNP

## 2016-06-18 NOTE — Patient Instructions (Signed)
Dysuria Introduction Dysuria is pain or discomfort while urinating. The pain or discomfort may be felt in the tube that carries urine out of the bladder (urethra) or in the surrounding tissue of the genitals. The pain may also be felt in the groin area, lower abdomen, and lower back. You may have to urinate frequently or have the sudden feeling that you have to urinate (urgency). Dysuria can affect both men and women, but is more common in women. Dysuria can be caused by many different things, including:  Urinary tract infection in women.  Infection of the kidney or bladder.  Kidney stones or bladder stones.  Certain sexually transmitted infections (STIs), such as chlamydia.  Dehydration.  Inflammation of the vagina.  Use of certain medicines.  Use of certain soaps or scented products that cause irritation. Follow these instructions at home: Watch your dysuria for any changes. The following actions may help to reduce any discomfort you are feeling:  Drink enough fluid to keep your urine clear or pale yellow.  Empty your bladder often. Avoid holding urine for long periods of time.  After a bowel movement or urination, women should cleanse from front to back, using each tissue only once.  Empty your bladder after sexual intercourse.  Take medicines only as directed by your health care provider.  If you were prescribed an antibiotic medicine, finish it all even if you start to feel better.  Avoid caffeine, tea, and alcohol. They can irritate the bladder and make dysuria worse. In men, alcohol may irritate the prostate.  Keep all follow-up visits as directed by your health care provider. This is important.  If you had any tests done to find the cause of dysuria, it is your responsibility to obtain your test results. Ask the lab or department performing the test when and how you will get your results. Talk with your health care provider if you have any questions about your  results. Contact a health care provider if:  You develop pain in your back or sides.  You have a fever.  You have nausea or vomiting.  You have blood in your urine.  You are not urinating as often as you usually do. Get help right away if:  You pain is severe and not relieved with medicines.  You are unable to hold down any fluids.  You or someone else notices a change in your mental function.  You have a rapid heartbeat at rest.  You have shaking or chills.  You feel extremely weak. This information is not intended to replace advice given to you by your health care provider. Make sure you discuss any questions you have with your health care provider. Document Released: 03/05/2004 Document Revised: 11/13/2015 Document Reviewed: 01/31/2014  2017 Elsevier  

## 2016-08-27 ENCOUNTER — Other Ambulatory Visit: Payer: Self-pay | Admitting: Nurse Practitioner

## 2016-08-27 DIAGNOSIS — E785 Hyperlipidemia, unspecified: Secondary | ICD-10-CM

## 2016-10-22 DIAGNOSIS — Z1231 Encounter for screening mammogram for malignant neoplasm of breast: Secondary | ICD-10-CM | POA: Diagnosis not present

## 2016-12-08 ENCOUNTER — Other Ambulatory Visit: Payer: Self-pay

## 2016-12-08 DIAGNOSIS — E785 Hyperlipidemia, unspecified: Secondary | ICD-10-CM

## 2016-12-08 MED ORDER — SIMVASTATIN 40 MG PO TABS: 40.0000 mg | ORAL_TABLET | Freq: Every day | ORAL | 0 refills | Status: DC

## 2017-01-17 ENCOUNTER — Ambulatory Visit (INDEPENDENT_AMBULATORY_CARE_PROVIDER_SITE_OTHER): Payer: Medicare Other | Admitting: Family Medicine

## 2017-01-17 ENCOUNTER — Encounter: Payer: Self-pay | Admitting: Family Medicine

## 2017-01-17 ENCOUNTER — Ambulatory Visit (INDEPENDENT_AMBULATORY_CARE_PROVIDER_SITE_OTHER): Payer: Medicare Other

## 2017-01-17 VITALS — BP 145/98 | HR 97 | Temp 97.5°F | Ht 63.0 in | Wt 126.6 lb

## 2017-01-17 DIAGNOSIS — J209 Acute bronchitis, unspecified: Secondary | ICD-10-CM

## 2017-01-17 MED ORDER — METHYLPREDNISOLONE ACETATE 80 MG/ML IJ SUSP
80.0000 mg | Freq: Once | INTRAMUSCULAR | Status: AC
  Administered 2017-01-17: 80 mg via INTRAMUSCULAR

## 2017-01-17 NOTE — Progress Notes (Addendum)
   HPI  Patient presents today with cough.  Patient states she's had symptoms for about 2 days. They include cough, nasal congestion, chest congestion, and some wheezing.  She denies any chest pain, fever, shortness of breath, chills, sweats.  She's tolerating food and fluids like usual. She does not have any.  PMH: Smoking status noted ROS: Per HPI  Objective: BP (!) 145/98   Pulse 97   Temp (!) 97.5 F (36.4 C) (Oral)   Ht 5\' 3"  (1.6 m)   Wt 126 lb 9.6 oz (57.4 kg)   BMI 22.43 kg/m  Gen: NAD, alert, cooperative with exam HEENT: NCAT CV: RRR, good S1/S2, no murmur Resp: CTABL, no wheezes, non-labored Ext: No edema, warm Neuro: Alert and oriented, No gross deficits  Assessment and plan:  # Acute bronchitis Likely viral, treat with IM Depo-Medrol Discussed avoiding antibiotics if possible. Patient call back in 5 days if not clearly improving. Plain films to rule out pneumonia. Lung exam is reassuring Return to clinic with any concerns.     Orders Placed This Encounter  Procedures  . DG Chest 2 View    Standing Status:   Future    Number of Occurrences:   1    Standing Expiration Date:   03/20/2018    Order Specific Question:   Reason for Exam (SYMPTOM  OR DIAGNOSIS REQUIRED)    Answer:   cough, r/o CAP    Order Specific Question:   Preferred imaging location?    Answer:   Internal    Order Specific Question:   Radiology Contrast Protocol - do NOT remove file path    Answer:   [email or web address]    Meds ordered this encounter  Medications  . methylPREDNISolone acetate (DEPO-MEDROL) injection 80 mg    Murtis SinkSam Bradshaw, MD [facility] 01/17/2017, 4:26 PM

## 2017-01-17 NOTE — Patient Instructions (Signed)

## 2017-03-08 ENCOUNTER — Encounter: Payer: Self-pay | Admitting: Nurse Practitioner

## 2017-03-08 ENCOUNTER — Ambulatory Visit (INDEPENDENT_AMBULATORY_CARE_PROVIDER_SITE_OTHER): Payer: Medicare Other | Admitting: Nurse Practitioner

## 2017-03-08 VITALS — BP 161/107 | HR 93 | Temp 97.0°F | Ht 63.0 in | Wt 127.0 lb

## 2017-03-08 DIAGNOSIS — K219 Gastro-esophageal reflux disease without esophagitis: Secondary | ICD-10-CM

## 2017-03-08 MED ORDER — PANTOPRAZOLE SODIUM 40 MG PO TBEC: 40.0000 mg | DELAYED_RELEASE_TABLET | Freq: Every day | ORAL | 3 refills | Status: DC

## 2017-03-08 NOTE — Progress Notes (Signed)
   Subjective:    Patient ID: Courtney Johnson, female    DOB: 08-Jun-1946, 71 y.o.   MRN: [id number]  Heartburn  She complains of abdominal pain (feels gassy and uncomfortable) and heartburn. She reports no nausea. Pertinent negatives include no fatigue.  Abdominal Pain  Pertinent negatives include no constipation, diarrhea, nausea or vomiting.   Patient in the office today with complaints of heartburn constantly and stomach pain frequently.  Patient has used Rolaids and OTC Prilosec, but has only been taking the Prilosec for about 3 days.  She has noticed mild improvement in the past few days while taking Prilosec.  Her stomach pain is intermittent and not occurring daily.  Pain is described as a bloated, aching feeling.    Patient's husband was diagnosed with lung cancer 1.5 years ago and she has been under increased stress.   Review of Systems  Constitutional: Negative for activity change, appetite change, fatigue and unexpected weight change.  Gastrointestinal: Positive for abdominal pain (feels gassy and uncomfortable) and heartburn. Negative for constipation, diarrhea, nausea and vomiting.  Neurological: Negative for dizziness and light-headedness.  Psychiatric/Behavioral:       Increased stress recently   All other systems reviewed and are negative.      Objective:   Physical Exam  Constitutional: She is oriented to person, place, and time. She appears well-developed and well-nourished. No distress.  HENT:  Head: Normocephalic.  Neck: Normal range of motion. Neck supple.  Cardiovascular: Normal rate, regular rhythm and normal heart sounds.   Pulmonary/Chest: Effort normal and breath sounds normal.  Abdominal: Soft. Bowel sounds are normal. She exhibits no distension. There is no tenderness. There is no guarding.  Lymphadenopathy:    She has no cervical adenopathy.  Neurological: She is alert and oriented to person, place, and time.  Skin: Skin is warm and dry.    Psychiatric: She has a normal mood and affect. Her behavior is normal.   BP (!) 161/107   Pulse 93   Temp (!) 97 F (36.1 C) (Oral)   Ht  (1.6 m)   Wt 127 lb (57.6 kg)   BMI 22.50 kg/m     Assessment & Plan:  1. Gastroesophageal reflux disease without esophagitis Avoid spicy foods Do not eat 2 hours prior to bedtime - pantoprazole (PROTONIX) 40 MG tablet; Take 1 tablet (40 mg total) by mouth daily.  Dispense: 30 tablet; Refill: 3  [name], FNP

## 2017-03-08 NOTE — Patient Instructions (Signed)
Food Choices for Gastroesophageal Reflux Disease, Adult When you have gastroesophageal reflux disease (GERD), the foods you eat and your eating habits are very important. Choosing the right foods can help ease your discomfort. What guidelines do I need to follow?  Choose fruits, vegetables, whole grains, and low-fat dairy products.  Choose low-fat meat, fish, and poultry.  Limit fats such as oils, salad dressings, butter, nuts, and avocado.  Keep a food diary. This helps you identify foods that cause symptoms.  Avoid foods that cause symptoms. These may be different for everyone.  Eat small meals often instead of 3 large meals a day.  Eat your meals slowly, in a place where you are relaxed.  Limit fried foods.  Cook foods using methods other than frying.  Avoid drinking alcohol.  Avoid drinking large amounts of liquids with your meals.  Avoid bending over or lying down until 2-3 hours after eating. What foods are not recommended? These are some foods and drinks that may make your symptoms worse: Vegetables  Tomatoes. Tomato juice. Tomato and spaghetti sauce. Chili peppers. Onion and garlic. Horseradish. Fruits  Oranges, grapefruit, and lemon (fruit and juice). Meats  High-fat meats, fish, and poultry. This includes hot dogs, ribs, ham, sausage, salami, and bacon. Dairy  Whole milk and chocolate milk. Sour cream. Cream. Butter. Ice cream. Cream cheese. Drinks  Coffee and tea. Bubbly (carbonated) drinks or energy drinks. Condiments  Hot sauce. Barbecue sauce. Sweets/Desserts  Chocolate and cocoa. Donuts. Peppermint and spearmint. Fats and Oils  High-fat foods. This includes French fries and potato chips. Other  Vinegar. Strong spices. This includes black pepper, white pepper, red pepper, cayenne, curry powder, cloves, ginger, and chili powder. The items listed above may not be a complete list of foods and drinks to avoid. Contact your dietitian for more information.    This information is not intended to replace advice given to you by your health care provider. Make sure you discuss any questions you have with your health care provider. Document Released: 12/07/2011 Document Revised: 11/13/2015 Document Reviewed: 04/11/2013 Elsevier Interactive Patient Education  2017 Elsevier Inc.  

## 2017-03-14 DIAGNOSIS — H25813 Combined forms of age-related cataract, bilateral: Secondary | ICD-10-CM | POA: Diagnosis not present

## 2017-04-11 ENCOUNTER — Encounter: Payer: Self-pay | Admitting: Nurse Practitioner

## 2017-04-11 ENCOUNTER — Ambulatory Visit (INDEPENDENT_AMBULATORY_CARE_PROVIDER_SITE_OTHER): Payer: Medicare Other | Admitting: Nurse Practitioner

## 2017-04-11 VITALS — BP 146/86 | HR 80 | Temp 97.3°F | Ht 63.0 in | Wt 128.0 lb

## 2017-04-11 DIAGNOSIS — Z0000 Encounter for general adult medical examination without abnormal findings: Secondary | ICD-10-CM

## 2017-04-11 DIAGNOSIS — F411 Generalized anxiety disorder: Secondary | ICD-10-CM

## 2017-04-11 DIAGNOSIS — E785 Hyperlipidemia, unspecified: Secondary | ICD-10-CM

## 2017-04-11 DIAGNOSIS — Z23 Encounter for immunization: Secondary | ICD-10-CM

## 2017-04-11 DIAGNOSIS — M8580 Other specified disorders of bone density and structure, unspecified site: Secondary | ICD-10-CM | POA: Diagnosis not present

## 2017-04-11 DIAGNOSIS — I10 Essential (primary) hypertension: Secondary | ICD-10-CM

## 2017-04-11 DIAGNOSIS — K219 Gastro-esophageal reflux disease without esophagitis: Secondary | ICD-10-CM | POA: Diagnosis not present

## 2017-04-11 MED ORDER — SIMVASTATIN 40 MG PO TABS: 40.0000 mg | ORAL_TABLET | Freq: Every day | ORAL | 1 refills | Status: DC

## 2017-04-11 MED ORDER — ALPRAZOLAM 0.25 MG PO TABS: 0.2500 mg | ORAL_TABLET | Freq: Two times a day (BID) | ORAL | 1 refills | Status: DC | PRN

## 2017-04-11 MED ORDER — PANTOPRAZOLE SODIUM 40 MG PO TBEC: 40.0000 mg | DELAYED_RELEASE_TABLET | Freq: Every day | ORAL | 5 refills | Status: DC

## 2017-04-11 NOTE — Progress Notes (Signed)
Subjective:    Patient ID: Courtney Johnson, female    DOB: [birth year], 71 y.o.   MRN: [id number]  HPI  SALIMAH MARTINOVICH is here today for follow up of chronic medical problem.  Outpatient Encounter Prescriptions as of 04/11/2017  Medication Sig  . ALPRAZolam (XANAX) 0.25 MG tablet Take 1 tablet (0.25 mg total) by mouth 2 (two) times daily as needed for sleep.  Marland Kitchen aspirin 81 MG tablet Take 81 mg by mouth daily.  . fluticasone (FLONASE) 50 MCG/ACT nasal spray Place 1 spray into both nostrils daily.  . pantoprazole (PROTONIX) 40 MG tablet Take 1 tablet (40 mg total) by mouth daily.  . simvastatin (ZOCOR) 40 MG tablet Take 1 tablet (40 mg total) by mouth daily.  . Vitamins A & D 5000-400 UNITS CAPS    No facility-administered encounter medications on file as of 04/11/2017.     1. Annual physical exam   2. Gastroesophageal reflux disease, esophagitis presence not specified Patient on protonix daily. Works well to keep symptoms under control   3. Osteopenia, unspecified location  Does weight lifyting on machines at [facility] 3x a week- does no waking.  4. Hyperlipidemia, unspecified hyperlipidemia type  Does not watch diet. Has on ly beenon cholesterol mes fro3 months- wants to see if working before she gets it filled  5. Generalized anxiety disorder  takes her xanax 2x a day- gets very anxious if does not take.  6.      White coat syndrome          Checks blood pressure at home and systolic runs in 631-497. BP Readings from Last 3 Encounters:  04/11/17 (!) 160/87  03/08/17 (!) 161/107  01/17/17 (!) 145/98    New complaints: none today  Social history: Lives with her husband    Review of Systems  Constitutional: Negative for activity change and appetite change.  HENT: Negative.   Eyes: Negative for pain.  Respiratory: Negative for shortness of breath.   Cardiovascular: Negative for chest pain, palpitations and leg swelling.  Gastrointestinal: Negative for abdominal pain.    Endocrine: Negative for polydipsia.  Genitourinary: Negative.   Skin: Negative for rash.  Neurological: Negative for dizziness, weakness and headaches.  Hematological: Does not bruise/bleed easily.  Psychiatric/Behavioral: Negative.   All other systems reviewed and are negative.      Objective:   Physical Exam  Constitutional: She is oriented to person, place, and time. She appears well-developed and well-nourished.  HENT:  Head: Normocephalic.  Right Ear: Hearing, tympanic membrane, external ear and ear canal normal.  Left Ear: Hearing, tympanic membrane, external ear and ear canal normal.  Nose: Nose normal.  Mouth/Throat: Uvula is midline and oropharynx is clear and moist.  Eyes: Pupils are equal, round, and reactive to light. Conjunctivae and EOM are normal.  Neck: Trachea normal, normal range of motion and full passive range of motion without pain. Neck supple. No JVD present. Carotid bruit is not present. No thyroid mass and no thyromegaly present.  Cardiovascular: Normal rate, regular rhythm, normal heart sounds and intact distal pulses.  Exam reveals no gallop and no friction rub.   No murmur heard. Pulmonary/Chest: Effort normal and breath sounds normal. Right breast exhibits no inverted nipple, no mass, no nipple discharge, no skin change and no tenderness. Left breast exhibits no inverted nipple, no mass, no nipple discharge, no skin change and no tenderness.  Abdominal: Soft. Bowel sounds are normal. She exhibits no distension and no mass. There is  no tenderness.  Genitourinary: No breast swelling, tenderness, discharge or bleeding.  Genitourinary Comments: bimanual exam-No adnexal masses or tenderness.   Musculoskeletal: Normal range of motion.  Lymphadenopathy:    She has no cervical adenopathy.  Neurological: She is alert and oriented to person, place, and time. She has normal reflexes.  Skin: Skin is warm and dry.  Psychiatric: She has a normal mood and affect. Her  behavior is normal. Judgment and thought content normal.   BP (!) 146/86   Pulse 80   Temp (!) 97.3 F (36.3 C) (Oral)   Ht 5' 3"  (1.6 m)   Wt 128 lb (58.1 kg)   BMI 22.67 kg/m        Assessment & Plan:  1. Annual physical exam - CBC with Differential/Platelet - Thyroid Panel With TSH  2. Gastroesophageal reflux disease, esophagitis presence not specified Avoid spicy foods Do not eat 2 hours prior to bedtime - pantoprazole (PROTONIX) 40 MG tablet; Take 1 tablet (40 mg total) by mouth daily.  Dispense: 30 tablet; Refill: 5  3. Osteopenia, unspecified location Weight bearing exercises discussed  4. Hyperlipidemia, unspecified hyperlipidemia type Low fat diet - simvastatin (ZOCOR) 40 MG tablet; Take 1 tablet (40 mg total) by mouth daily.  Dispense: 90 tablet; Refill: 1 - CMP14+EGFR - Lipid panel  5. Generalized anxiety disorder Stress management - ALPRAZolam (XANAX) 0.25 MG tablet; Take 1 tablet (0.25 mg total) by mouth 2 (two) times daily as needed for sleep.  Dispense: 60 tablet; Refill: 1  6. White coat syndrome with diagnosis of hypertension Stress management   Labs pending Health maintenance reviewed Diet and exercise encouraged Continue all meds Follow up  In 6 months   [place], FNP

## 2017-04-11 NOTE — Addendum Note (Signed)
Addended by: Bennie PieriniMARTIN, MARY-MARGARET on: 04/11/2017 11:52 AM   Modules accepted: Level of Service

## 2017-04-11 NOTE — Patient Instructions (Signed)
Bone Health Bones protect organs, store calcium, and anchor muscles. Good health habits, such as eating nutritious foods and exercising regularly, are important for maintaining healthy bones. They can also help to prevent a condition that causes bones to lose density and become weak and brittle (osteoporosis). Why is bone mass important? Bone mass refers to the amount of bone tissue that you have. The higher your bone mass, the stronger your bones. An important step toward having healthy bones throughout life is to have strong and dense bones during childhood. A young adult who has a high bone mass is more likely to have a high bone mass later in life. Bone mass at its greatest it is called peak bone mass. A large decline in bone mass occurs in older adults. In women, it occurs about the time of menopause. During this time, it is important to practice good health habits, because if more bone is lost than what is replaced, the bones will become less healthy and more likely to break (fracture). If you find that you have a low bone mass, you may be able to prevent osteoporosis or further bone loss by changing your diet and lifestyle. How can I find out if my bone mass is low? Bone mass can be measured with an X-ray test that is called a bone mineral density (BMD) test. This test is recommended for all women who are age 65 or older. It may also be recommended for men who are age 70 or older, or for people who are more likely to develop osteoporosis due to:  Having bones that break easily.  Having a long-term disease that weakens bones, such as kidney disease or rheumatoid arthritis.  Having menopause earlier than normal.  Taking medicine that weakens bones, such as steroids, thyroid hormones, or hormone treatment for breast cancer or prostate cancer.  Smoking.  Drinking three or more alcoholic drinks each day.  What are the nutritional recommendations for healthy bones? To have healthy bones, you  need to get enough of the right minerals and vitamins. Most nutrition experts recommend getting these nutrients from the foods that you eat. Nutritional recommendations vary from person to person. Ask your health care provider what is healthy for you. Here are some general guidelines. Calcium Recommendations Calcium is the most important (essential) mineral for bone health. Most people can get enough calcium from their diet, but supplements may be recommended for people who are at risk for osteoporosis. Good sources of calcium include:  Dairy products, such as low-fat or nonfat milk, cheese, and yogurt.  Dark green leafy vegetables, such as bok choy and broccoli.  Calcium-fortified foods, such as orange juice, cereal, bread, soy beverages, and tofu products.  Nuts, such as almonds.  Follow these recommended amounts for daily calcium intake:  Children, age 1?3: 700 mg.  Children, age 4?8: 1,000 mg.  Children, age 9?13: 1,300 mg.  Teens, age 14?18: 1,300 mg.  Adults, age 19?50: 1,000 mg.  Adults, age 51?70: ? Men: 1,000 mg. ? Women: 1,200 mg.  Adults, age 71 or older: 1,200 mg.  Pregnant and breastfeeding females: ? Teens: 1,300 mg. ? Adults: 1,000 mg.  Vitamin D Recommendations Vitamin D is the most essential vitamin for bone health. It helps the body to absorb calcium. Sunlight stimulates the skin to make vitamin D, so be sure to get enough sunlight. If you live in a cold climate or you do not get outside often, your health care provider may recommend that you take vitamin   D supplements. Good sources of vitamin D in your diet include:  Egg yolks.  Saltwater fish.  Milk and cereal fortified with vitamin D.  Follow these recommended amounts for daily vitamin D intake:  Children and teens, age 1?18: 600 international units.  Adults, age 50 or younger: 400-800 international units.  Adults, age 51 or older: 800-1,000 international units.  Other Nutrients Other nutrients  for bone health include:  Phosphorus. This mineral is found in meat, poultry, dairy foods, nuts, and legumes. The recommended daily intake for adult men and adult women is 700 mg.  Magnesium. This mineral is found in seeds, nuts, dark green vegetables, and legumes. The recommended daily intake for adult men is 400?420 mg. For adult women, it is 310?320 mg.  Vitamin K. This vitamin is found in green leafy vegetables. The recommended daily intake is 120 mg for adult men and 90 mg for adult women.  What type of physical activity is best for building and maintaining healthy bones? Weight-bearing and strength-building activities are important for building and maintaining peak bone mass. Weight-bearing activities cause muscles and bones to work against gravity. Strength-building activities increases muscle strength that supports bones. Weight-bearing and muscle-building activities include:  Walking and hiking.  Jogging and running.  Dancing.  Gym exercises.  Lifting weights.  Tennis and racquetball.  Climbing stairs.  Aerobics.  Adults should get at least 30 minutes of moderate physical activity on most days. Children should get at least 60 minutes of moderate physical activity on most days. Ask your health care provide what type of exercise is best for you. Where can I find more information? For more information, check out the following websites:  National Osteoporosis Foundation: http://nof.org/learn/basics  National Institutes of Health: http://www.niams.nih.gov/Health_Info/Bone/Bone_Health/bone_health_for_life.asp  This information is not intended to replace advice given to you by your health care provider. Make sure you discuss any questions you have with your health care provider. Document Released: 08/28/2003 Document Revised: 12/26/2015 Document Reviewed: 06/12/2014 Elsevier Interactive Patient Education  2018 Elsevier Inc.  

## 2017-04-12 ENCOUNTER — Telehealth: Payer: Self-pay | Admitting: Nurse Practitioner

## 2017-04-12 LAB — THYROID PANEL WITH TSH
FREE THYROXINE INDEX: 2 (ref 1.2–4.9)
T3 UPTAKE RATIO: 23 % — AB (ref 24–39)
T4 TOTAL: 8.7 ug/dL (ref 4.5–12.0)
TSH: 2.27 u[IU]/mL (ref 0.450–4.500)

## 2017-04-12 LAB — LIPID PANEL
Chol/HDL Ratio: 4.2 ratio (ref 0.0–4.4)
Cholesterol, Total: 221 mg/dL — ABNORMAL HIGH (ref 100–199)
HDL: 53 mg/dL (ref >39)
LDL CALC: 143 mg/dL — AB (ref 0–99)
TRIGLYCERIDES: 124 mg/dL (ref 0–149)
VLDL Cholesterol Cal: 25 mg/dL (ref 5–40)

## 2017-04-12 LAB — CBC WITH DIFFERENTIAL/PLATELET
BASOS ABS: 0 10*3/uL (ref 0.0–0.2)
Basos: 1 %
EOS (ABSOLUTE): 0.1 10*3/uL (ref 0.0–0.4)
EOS: 2 %
HEMATOCRIT: 38.3 % (ref 34.0–46.6)
Hemoglobin: 12.8 g/dL (ref 11.1–15.9)
IMMATURE GRANULOCYTES: 0 %
Immature Grans (Abs): 0 10*3/uL (ref 0.0–0.1)
Lymphocytes Absolute: 1.6 10*3/uL (ref 0.7–3.1)
Lymphs: 34 %
MCH: 31.2 pg (ref 26.6–33.0)
MCHC: 33.4 g/dL (ref 31.5–35.7)
MCV: 93 fL (ref 79–97)
MONOCYTES: 4 %
MONOS ABS: 0.2 10*3/uL (ref 0.1–0.9)
NEUTROS PCT: 59 %
Neutrophils Absolute: 2.9 10*3/uL (ref 1.4–7.0)
Platelets: 306 10*3/uL (ref 150–379)
RBC: 4.1 x10E6/uL (ref 3.77–5.28)
RDW: 13.6 % (ref 12.3–15.4)
WBC: 4.8 10*3/uL (ref 3.4–10.8)

## 2017-04-12 LAB — CMP14+EGFR
ALBUMIN: 4.2 g/dL (ref 3.6–4.8)
ALK PHOS: 72 IU/L (ref 39–117)
ALT: 17 IU/L (ref 0–32)
AST: 26 IU/L (ref 0–40)
Albumin/Globulin Ratio: 1.9 (ref 1.2–2.2)
BILIRUBIN TOTAL: 0.3 mg/dL (ref 0.0–1.2)
BUN / CREAT RATIO: 10 — AB (ref 12–28)
BUN: 8 mg/dL (ref 8–27)
CHLORIDE: 106 mmol/L (ref 96–106)
CO2: 23 mmol/L (ref 20–29)
CREATININE: 0.81 mg/dL (ref 0.57–1.00)
Calcium: 9.6 mg/dL (ref 8.7–10.3)
GFR calc Af Amer: 86 mL/min/{1.73_m2} (ref >59)
GFR calc non Af Amer: 74 mL/min/{1.73_m2} (ref >59)
GLOBULIN, TOTAL: 2.2 g/dL (ref 1.5–4.5)
Glucose: 83 mg/dL (ref 65–99)
Potassium: 4 mmol/L (ref 3.5–5.2)
SODIUM: 143 mmol/L (ref 134–144)
Total Protein: 6.4 g/dL (ref 6.0–8.5)

## 2017-04-13 NOTE — Telephone Encounter (Signed)
Refer to lab results.  

## 2017-04-18 DIAGNOSIS — H35033 Hypertensive retinopathy, bilateral: Secondary | ICD-10-CM | POA: Diagnosis not present

## 2017-04-18 DIAGNOSIS — H25013 Cortical age-related cataract, bilateral: Secondary | ICD-10-CM | POA: Diagnosis not present

## 2017-04-18 DIAGNOSIS — H2513 Age-related nuclear cataract, bilateral: Secondary | ICD-10-CM | POA: Diagnosis not present

## 2017-04-18 DIAGNOSIS — H25042 Posterior subcapsular polar age-related cataract, left eye: Secondary | ICD-10-CM | POA: Diagnosis not present

## 2017-04-18 DIAGNOSIS — H25012 Cortical age-related cataract, left eye: Secondary | ICD-10-CM | POA: Diagnosis not present

## 2017-04-18 DIAGNOSIS — H353131 Nonexudative age-related macular degeneration, bilateral, early dry stage: Secondary | ICD-10-CM | POA: Diagnosis not present

## 2017-04-18 DIAGNOSIS — H2512 Age-related nuclear cataract, left eye: Secondary | ICD-10-CM | POA: Diagnosis not present

## 2017-04-21 NOTE — Addendum Note (Signed)
Addended by: Bennie PieriniMARTIN, MARY-MARGARET on: 04/21/2017 10:57 AM   Modules accepted: Level of Service

## 2017-05-10 DIAGNOSIS — H25812 Combined forms of age-related cataract, left eye: Secondary | ICD-10-CM | POA: Diagnosis not present

## 2017-05-10 DIAGNOSIS — H2512 Age-related nuclear cataract, left eye: Secondary | ICD-10-CM | POA: Diagnosis not present

## 2017-05-21 ENCOUNTER — Telehealth: Payer: Self-pay | Admitting: Nurse Practitioner

## 2017-05-21 ENCOUNTER — Other Ambulatory Visit: Payer: Self-pay | Admitting: Family Medicine

## 2017-05-21 MED ORDER — SULFAMETHOXAZOLE-TRIMETHOPRIM 800-160 MG PO TABS: 1.0000 | ORAL_TABLET | Freq: Two times a day (BID) | ORAL | 0 refills | Status: DC

## 2017-05-21 NOTE — Telephone Encounter (Signed)
What symptoms do you have? Sinus stopped up headcold  How long have you been sick? yesterday  Have you been seen for this problem? yes  If your provider decides to give you a prescription, which pharmacy would you like for it to be sent to? [facility]   Patient informed that this information will be sent to the clinical staff for review and that they should receive a follow up call.

## 2017-05-23 NOTE — Telephone Encounter (Signed)
Per pt's husband, pt seen on Saturday Will call back if needs to be seen

## 2017-05-27 DIAGNOSIS — H25011 Cortical age-related cataract, right eye: Secondary | ICD-10-CM | POA: Diagnosis not present

## 2017-05-27 DIAGNOSIS — H2511 Age-related nuclear cataract, right eye: Secondary | ICD-10-CM | POA: Diagnosis not present

## 2017-06-18 ENCOUNTER — Ambulatory Visit (INDEPENDENT_AMBULATORY_CARE_PROVIDER_SITE_OTHER): Payer: Medicare Other | Admitting: Physician Assistant

## 2017-06-18 VITALS — BP 161/91 | HR 109 | Temp 96.9°F | Ht 63.0 in | Wt 124.0 lb

## 2017-06-18 DIAGNOSIS — K219 Gastro-esophageal reflux disease without esophagitis: Secondary | ICD-10-CM | POA: Diagnosis not present

## 2017-06-18 DIAGNOSIS — L570 Actinic keratosis: Secondary | ICD-10-CM | POA: Diagnosis not present

## 2017-06-18 DIAGNOSIS — L723 Sebaceous cyst: Secondary | ICD-10-CM

## 2017-06-18 MED ORDER — PANTOPRAZOLE SODIUM 40 MG PO TBEC: 40.0000 mg | DELAYED_RELEASE_TABLET | Freq: Every day | ORAL | 11 refills | Status: DC

## 2017-06-18 NOTE — Progress Notes (Signed)
Possible [facility] Nov 2019 colonoscopy    BP (!) 161/91   Pulse (!) 109   Temp (!) 96.9 F (36.1 C) (Oral)   Ht 5\' 3"  (1.6 m)   Wt 124 lb (56.2 kg)   BMI 21.97 kg/m    Subjective:    Patient ID: Courtney CornfieldPatricia A Johnson, female    DOB: 04/20/1946, 71 y.o.   MRN: [id number]  HPI: Courtney Cornfieldatricia A Aument is a 71 y.o. female presenting on 06/18/2017 for Skin Problem (nodule on left lower leg x 2 weeks. No itching. It does not drain or bleed. Area is tender and red at times but not always. )  She has had an irritated lesion on her lateral left leg. It is painful when she touches it or bumps it.  She has some other actinic changes on her skin.    Also needs GERD medication refilled.  Relevant past medical, surgical, family and social history reviewed and updated as indicated. Allergies and medications reviewed and updated.  Past Medical History:  Diagnosis Date  . Hyperlipidemia   . Hypertension     Past Surgical History:  Procedure Laterality Date  . ABDOMINAL HYSTERECTOMY      Review of Systems  Constitutional: Negative.   HENT: Negative.   Eyes: Negative.   Respiratory: Negative.   Gastrointestinal: Negative.   Genitourinary: Negative.   Skin: Positive for color change and wound.    Allergies as of 06/18/2017   No Known Allergies     Medication List        Accurate as of 06/18/17 11:59 PM. Always use your most recent med list.          ALPRAZolam 0.25 MG tablet Commonly known as:  XANAX Take 1 tablet (0.25 mg total) by mouth 2 (two) times daily as needed for sleep.   aspirin 81 MG tablet Take 81 mg by mouth daily.   fluticasone 50 MCG/ACT nasal spray Commonly known as:  FLONASE Place 1 spray into both nostrils daily.   pantoprazole 40 MG tablet Commonly known as:  PROTONIX Take 1 tablet (40 mg total) by mouth daily.   simvastatin 40 MG tablet Commonly known as:  ZOCOR Take 1 tablet (40 mg total) by mouth daily.     sulfamethoxazole-trimethoprim 800-160 MG tablet Commonly known as:  BACTRIM DS,SEPTRA DS Take 1 tablet by mouth 2 (two) times daily. Until gone, for infection   Vitamins A & D 5000-400 units Caps          Objective:    BP (!) 161/91   Pulse (!) 109   Temp (!) 96.9 F (36.1 C) (Oral)   Ht 5\' 3"  (1.6 m)   Wt 124 lb (56.2 kg)   BMI 21.97 kg/m   No Known Allergies  Physical Exam  Constitutional: She is oriented to person, place, and time. She appears well-developed and well-nourished.  HENT:  Head: Normocephalic and atraumatic.  Eyes: Conjunctivae and EOM are normal. Pupils are equal, round, and reactive to light.  Cardiovascular: Normal rate, regular rhythm, normal heart sounds and intact distal pulses.  Pulmonary/Chest: Effort normal and breath sounds normal.  Abdominal: Soft. Bowel sounds are normal.  Neurological: She is alert and oriented to person, place, and time. She has normal reflexes.  Skin: Skin is warm and dry. Lesion and rash noted. Rash is nodular. There is erythema.     Actinic nature on the lateral leg, tender to touch  Psychiatric: She has a normal mood and affect.  Her behavior is normal. Judgment and thought content normal.  Nursing note and vitals reviewed.       Assessment & Plan:   1. Actinic keratosis  2. Inflamed epidermoid cyst of skin Follow up with Gennette PacMary Margaret for excision  3. Gastroesophageal reflux disease without esophagitis - pantoprazole (PROTONIX) 40 MG tablet; Take 1 tablet (40 mg total) by mouth daily.  Dispense: 30 tablet; Refill: 11    Current Outpatient Medications:  .  ALPRAZolam (XANAX) 0.25 MG tablet, Take 1 tablet (0.25 mg total) by mouth 2 (two) times daily as needed for sleep., Disp: 60 tablet, Rfl: 1 .  aspirin 81 MG tablet, Take 81 mg by mouth daily., Disp: , Rfl:  .  fluticasone (FLONASE) 50 MCG/ACT nasal spray, Place 1 spray into both nostrils daily., Disp: 16 g, Rfl: 2 .  pantoprazole (PROTONIX) 40 MG tablet,  Take 1 tablet (40 mg total) by mouth daily., Disp: 30 tablet, Rfl: 11 .  simvastatin (ZOCOR) 40 MG tablet, Take 1 tablet (40 mg total) by mouth daily., Disp: 90 tablet, Rfl: 1 .  sulfamethoxazole-trimethoprim (BACTRIM DS,SEPTRA DS) 800-160 MG tablet, Take 1 tablet by mouth 2 (two) times daily. Until gone, for infection, Disp: 20 tablet, Rfl: 0 .  Vitamins A & D 5000-400 UNITS CAPS, , Disp: , Rfl:  Continue all other maintenance medications as listed above.  Follow up plan: No Follow-up on file.  Educational handout given for survey  Remus LofflerAngel S. Newell Frater PA-C [facility] [address]  [place], [place] [address] [phone]   06/20/2017, 2:06 PM

## 2017-06-20 ENCOUNTER — Encounter: Payer: Self-pay | Admitting: Physician Assistant

## 2017-06-27 DIAGNOSIS — H04123 Dry eye syndrome of bilateral lacrimal glands: Secondary | ICD-10-CM | POA: Diagnosis not present

## 2017-06-27 DIAGNOSIS — H01009 Unspecified blepharitis unspecified eye, unspecified eyelid: Secondary | ICD-10-CM | POA: Diagnosis not present

## 2017-06-27 DIAGNOSIS — H16223 Keratoconjunctivitis sicca, not specified as Sjogren's, bilateral: Secondary | ICD-10-CM | POA: Diagnosis not present

## 2017-06-27 DIAGNOSIS — G514 Facial myokymia: Secondary | ICD-10-CM | POA: Diagnosis not present

## 2017-06-28 ENCOUNTER — Ambulatory Visit (INDEPENDENT_AMBULATORY_CARE_PROVIDER_SITE_OTHER): Payer: Medicare Other | Admitting: Nurse Practitioner

## 2017-06-28 ENCOUNTER — Encounter: Payer: Self-pay | Admitting: Nurse Practitioner

## 2017-06-28 VITALS — BP 143/91 | HR 88 | Temp 96.7°F | Ht 63.0 in | Wt 124.0 lb

## 2017-06-28 DIAGNOSIS — L989 Disorder of the skin and subcutaneous tissue, unspecified: Secondary | ICD-10-CM | POA: Diagnosis not present

## 2017-06-28 DIAGNOSIS — L570 Actinic keratosis: Secondary | ICD-10-CM

## 2017-06-28 NOTE — Progress Notes (Signed)
   Subjective:    Patient ID: Courtney Johnson, female    DOB: 08/25/1945, 72 y.o.   MRN: [id number]  HPI Patient comes in to have skin lesion removed. Skin lesion left shin area.     Review of Systems  Respiratory: Negative.   Skin:       Skin lesion left shin   Neurological: Negative.   Psychiatric/Behavioral: Negative.   All other systems reviewed and are negative.      Objective:   Physical Exam  Constitutional: She is oriented to person, place, and time. She appears well-developed and well-nourished. No distress.  Cardiovascular: Normal rate and regular rhythm.  Pulmonary/Chest: Effort normal.  Neurological: She is alert and oriented to person, place, and time.  Skin:  2cm raised flesh colored scaley lesion left anterior shin   BP (!) 143/91   Pulse 88   Temp (!) 96.7 F (35.9 C) (Oral)   Ht 5\' 3"  (1.6 m)   Wt 124 lb (56.2 kg)   BMI 21.97 kg/m   PROCEDURE  Consent:    Consent obtained:  verbal from payient   Consent given by:  patient   Risks discussed:  possibble infection   Alternatives discussed:  to watch Wound    Type:  skin keratosis   Size:  2cm   Location: left shin Pre-procedure details:    Skin preparation:  betadine Anesthesia     Anesthesia method:  local   Local anesthetic: lidocaine 1% with epi- 3ml Procedure type:    Complexity:  simple Procedure details:    Incision types:  football shape around lesion   Scalpel blade:  #15   Wound management:  cleaned with saline   Drainage appearance:  none   Drainage amount:  none   Wound treatment:  3-0 vicryl- 2 horiizonal mattress stitch   Packing materials: none Post-procedure details:    Dressing: 4x4 and tegraderm   Patient tolerance of procedure:  well        Assessment & Plan:  1. Skin lesion of left lower limb Keep clean and dry Watch for signs of infection Stitch removal in 10 days  Courtney Daphine DeutscherMartin, FNP

## 2017-06-28 NOTE — Patient Instructions (Signed)
Sutured Wound Care Sutures are stitches that can be used to close wounds. Taking care of your wound properly can help prevent pain and infection. It can also help your wound to heal more quickly. How is this treated? Wound Care  Keep the wound clean and dry.  If you were given a bandage (dressing), change it at least one time per day or as told by your doctor. You should also change it if it gets wet or dirty.  Keep the wound completely dry for the first 24 hours or as told by your doctor. After that time, you may shower or bathe. However, make sure that the wound is not soaked in water until the sutures have been removed.  Clean the wound one time each day or as told by your doctor. ? Wash the wound with soap and water. ? Rinse the wound with water to remove all soap. ? Pat the wound dry with a clean towel. Do not rub the wound.  After cleaning the wound, put a thin layer of antibiotic ointment on it as told your doctor. This ointment: ? Helps to prevent infection. ? Keeps the bandage from sticking to the wound.  Have the sutures removed as told by your doctor. General Instructions  Take or apply medicines only as told by your doctor.  To help prevent scarring, make sure to cover your wound with sunscreen whenever you are outside after the sutures are removed and the wound is healed. Make sure to wear a sunscreen of at least 30 SPF.  If you were prescribed an antibiotic medicine or ointment, finish all of it even if you start to feel better.  Do not scratch or pick at the wound.  Keep all follow-up visits as told by your doctor. This is important.  Check your wound every day for signs of infection. Watch for: ? Redness, swelling, or pain. ? Fluid, blood, or pus.  Raise (elevate) the injured area above the level of your heart while you are sitting or lying down, if possible.  Avoid stretching your wound.  Drink enough fluids to keep your pee (urine) clear or pale  yellow. Contact a doctor if:  You were given a tetanus shot and you have any of these where the needle went in: ? Swelling. ? Very bad pain. ? Redness. ? Bleeding.  You have a fever.  A wound that was closed breaks open.  You notice a bad smell coming from the wound.  You notice something coming out of the wound, such as wood or glass.  Medicine does not help your pain.  You have any of these at the site of the wound. ? More redness. ? More swelling. ? More pain.  You have any of these coming from the wound. ? Fluid. ? Blood. ? Pus.  You notice a change in the color of your skin near the wound.  You need to change the bandage often due to fluid, blood, or pus coming from the wound.  You have a new rash.  You have numbness around the wound. Get help right away if:  You have very bad swelling around the wound.  Your pain suddenly gets worse and is very bad.  You have painful lumps near the wound or on skin that is anywhere on your body.  You have a red streak going away from the wound.  The wound is on your hand or foot and you cannot move a finger or toe like normal.  The   wound is on your hand or foot and you notice that your fingers or toes look pale or bluish. This information is not intended to replace advice given to you by your health care provider. Make sure you discuss any questions you have with your health care provider. Document Released: 11/24/2007 Document Revised: 11/13/2015 Document Reviewed: 01/17/2013 Elsevier Interactive Patient Education  2017 Elsevier Inc.  

## 2017-07-08 ENCOUNTER — Encounter: Payer: Self-pay | Admitting: Nurse Practitioner

## 2017-07-08 ENCOUNTER — Ambulatory Visit (INDEPENDENT_AMBULATORY_CARE_PROVIDER_SITE_OTHER): Payer: Medicare Other | Admitting: Nurse Practitioner

## 2017-07-08 VITALS — BP 161/93 | HR 91 | Temp 98.6°F | Ht 63.0 in | Wt 124.0 lb

## 2017-07-08 DIAGNOSIS — Z4802 Encounter for removal of sutures: Secondary | ICD-10-CM

## 2017-07-08 NOTE — Progress Notes (Signed)
   Subjective:    Patient ID: Courtney Johnson, female    DOB: 14-Feb-1946, 72 y.o.   MRN: [id number]  HPI  Patient here today for suture removal. Patient was seen on 06/28/17 with left lower leg skin lesion. Was removed- here today for suture removal.   Review of Systems  Respiratory: Negative.   Cardiovascular: Negative.   Skin: Positive for wound (left lower leg).  Neurological: Negative.   Psychiatric/Behavioral: Negative.   All other systems reviewed and are negative.      Objective:   Physical Exam  Constitutional: She appears well-developed and well-nourished. No distress.  Cardiovascular: Normal rate and regular rhythm.  Pulmonary/Chest: Effort normal and breath sounds normal.  Skin:  Wound edges well approximated. No erythema or edema. No drainage    Steri strips applied  BP (!) 161/93   Pulse 91   Temp 98.6 F (37 C) (Oral)   Ht 5\' 3"  (1.6 m)   Wt 124 lb (56.2 kg)   BMI 21.97 kg/m          Assessment & Plan:   1. Visit for suture removal    Wound care discussed' steri strips will just come off on their own RTO prn  Mary-Margaret Daphine DeutscherMartin, FNP

## 2017-07-08 NOTE — Patient Instructions (Signed)
Sterile Tape Wound Care °Some cuts and wounds can be closed using sterile tape, also called skin adhesive strips. Skin adhesive strips can be used for shallow (superficial) and simple cuts, wounds, lacerations, and some surgical incisions. These strips act in place of stitches, or in addition to stitches, to hold the edges of the wound together to allow for better healing. Unlike stitches, the adhesive strips do not require needles or anesthetic medicine for placement. The strips usually fall off on their own as the wound is healing. It is important to take proper care of your wound at home while it heals. °How to care for a sterile tape wound °· Try to keep the area around your wound clean and dry. Do not allow the adhesive strips to get wet for the first 12 hours. °· Do not use any soaps or ointments on the wound for the first 12 hours. °· If a bandage (dressing) has been applied, keep it dry. °· Follow instructions from your health care provider about how often to change the dressing. °¨ Wash your hands with soap and water before you change your dressing. If soap and water are not available, use hand sanitizer. °¨ Change your dressing as told by your health care provider. °¨ Leave adhesive strips in place. These skin closures may need to stay in place for 2 weeks or longer. If adhesive strip edges start to loosen and curl up, you may trim the loose edges. Do not remove adhesive strips completely unless your health care provider tells you to do that. °· Do not scratch, rub, or pick at the wound area. °· Protect the wound from further injury until it is healed. °· Protect the wound from sun and tanning bed exposure while it is healing, and for several weeks after healing. °· Check the wound every day for signs of infection. Check for: °¨ More redness, swelling, or pain. °¨ More fluid or blood. °¨ Warmth. °¨ Pus or a bad smell. °Follow these instructions at home: °· Take over-the-counter and prescription medicines  only as told by your health care provider. °· Keep all follow-up visits as told by your health care provider. This is important. °Contact a health care provider if: °· Your adhesive strips become soaked with blood or fall off before the wound has healed. The tape will need to be replaced. °· You have a fever. °Get help right away if: °· You have chills. °· You develop a rash after the strips are applied. °· You have a red streak that goes away from the wound. °· You have more redness, swelling, or pain around your wound. °· You have more fluid or blood coming from your wound. °· Your wound feels warm to the touch. °· You have pus or a bad smell coming from your wound. °· Your wound breaks open. °This information is not intended to replace advice given to you by your health care provider. Make sure you discuss any questions you have with your health care provider. °Document Released: 07/15/2004 Document Revised: 04/30/2016 Document Reviewed: 04/30/2016 °Elsevier Interactive Patient Education © 2017 Elsevier Inc. ° °

## 2017-08-27 ENCOUNTER — Ambulatory Visit (INDEPENDENT_AMBULATORY_CARE_PROVIDER_SITE_OTHER): Payer: Medicare Other | Admitting: Nurse Practitioner

## 2017-08-27 VITALS — Temp 96.8°F | Ht 63.0 in | Wt 126.0 lb

## 2017-08-27 DIAGNOSIS — M7989 Other specified soft tissue disorders: Secondary | ICD-10-CM

## 2017-08-27 DIAGNOSIS — M79662 Pain in left lower leg: Secondary | ICD-10-CM | POA: Diagnosis not present

## 2017-08-27 NOTE — Progress Notes (Signed)
   Subjective:    Patient ID: Courtney Johnson, female    DOB: 1946-03-30, 72 y.o.   MRN: [id number]  HPI Patient had a keratosis taken off of hr left lower leg in jamuary .it is not red or swollen it is just sore to touch distally. Area has healed niely  Review of Systems  Constitutional: Negative.   HENT: Negative.   Respiratory: Negative.   All other systems reviewed and are negative.      Objective:   Physical Exam  Constitutional: She appears well-developed and well-nourished.  Cardiovascular: Normal rate and regular rhythm.  Pulmonary/Chest: Effort normal and breath sounds normal.  Musculoskeletal:  Soreness to left lower leg distally to where keratosis was removed. No erythema no edema  Skin: Skin is warm and dry.  Psychiatric: She has a normal mood and affect. Her behavior is normal. Judgment and thought content normal.    Temp (!) 96.8 F (36 C) (Oral)   Ht 5\' 3"  (1.6 m)   Wt 126 lb (57.2 kg)   BMI 22.32 kg/m        Assessment & Plan:   1. Pain and swelling of left lower leg    Do not squeeze on area Leave alone Try some ibuprofen every 6 hours for the next several days If no better let me know  Mary-Margaret Daphine DeutscherMartin, FNP

## 2017-09-28 DIAGNOSIS — H2511 Age-related nuclear cataract, right eye: Secondary | ICD-10-CM | POA: Diagnosis not present

## 2017-09-28 DIAGNOSIS — H25011 Cortical age-related cataract, right eye: Secondary | ICD-10-CM | POA: Diagnosis not present

## 2017-10-24 DIAGNOSIS — Z1231 Encounter for screening mammogram for malignant neoplasm of breast: Secondary | ICD-10-CM | POA: Diagnosis not present

## 2017-11-01 DIAGNOSIS — H52221 Regular astigmatism, right eye: Secondary | ICD-10-CM | POA: Diagnosis not present

## 2017-11-01 DIAGNOSIS — H25811 Combined forms of age-related cataract, right eye: Secondary | ICD-10-CM | POA: Diagnosis not present

## 2017-11-01 DIAGNOSIS — H2511 Age-related nuclear cataract, right eye: Secondary | ICD-10-CM | POA: Diagnosis not present

## 2017-11-17 ENCOUNTER — Other Ambulatory Visit: Payer: Self-pay | Admitting: Nurse Practitioner

## 2017-11-17 DIAGNOSIS — E785 Hyperlipidemia, unspecified: Secondary | ICD-10-CM

## 2017-11-17 NOTE — Telephone Encounter (Signed)
Last refill without being seen 

## 2017-11-17 NOTE — Telephone Encounter (Signed)
Pt aware and offered to schedule her on the phone but pt states she will call back to schedule.

## 2017-12-05 ENCOUNTER — Ambulatory Visit (INDEPENDENT_AMBULATORY_CARE_PROVIDER_SITE_OTHER): Payer: Medicare Other | Admitting: Nurse Practitioner

## 2017-12-05 ENCOUNTER — Encounter: Payer: Self-pay | Admitting: Nurse Practitioner

## 2017-12-05 ENCOUNTER — Ambulatory Visit (INDEPENDENT_AMBULATORY_CARE_PROVIDER_SITE_OTHER): Payer: Medicare Other

## 2017-12-05 VITALS — BP 138/82 | HR 82 | Temp 96.9°F | Ht 63.0 in | Wt 124.0 lb

## 2017-12-05 DIAGNOSIS — F411 Generalized anxiety disorder: Secondary | ICD-10-CM

## 2017-12-05 DIAGNOSIS — M8588 Other specified disorders of bone density and structure, other site: Secondary | ICD-10-CM | POA: Diagnosis not present

## 2017-12-05 DIAGNOSIS — E785 Hyperlipidemia, unspecified: Secondary | ICD-10-CM | POA: Diagnosis not present

## 2017-12-05 DIAGNOSIS — K219 Gastro-esophageal reflux disease without esophagitis: Secondary | ICD-10-CM | POA: Diagnosis not present

## 2017-12-05 DIAGNOSIS — M8580 Other specified disorders of bone density and structure, unspecified site: Secondary | ICD-10-CM | POA: Diagnosis not present

## 2017-12-05 DIAGNOSIS — Z1159 Encounter for screening for other viral diseases: Secondary | ICD-10-CM | POA: Diagnosis not present

## 2017-12-05 DIAGNOSIS — M810 Age-related osteoporosis without current pathological fracture: Secondary | ICD-10-CM | POA: Diagnosis not present

## 2017-12-05 MED ORDER — FLUTICASONE PROPIONATE 50 MCG/ACT NA SUSP: 2.0000 | Freq: Every day | NASAL | 6 refills | Status: DC

## 2017-12-05 NOTE — Progress Notes (Signed)
Subjective:    Patient ID: Courtney Johnson, female    DOB: [birth year], 72 y.o.   MRN: [id number]   Chief Complaint: No chief complaint on file.   HPI:  1. Is currently on protonix daily. She says it works well to keep symptoms under control.  2. Osteopenia, unspecified location  Last BMD test was 04/24/14 with tscore of -2.4. She has been taking vitamin d OTC diely but id on no calcium supplements. Denies any back pain.  3. Hyperlipidemia, unspecified hyperlipidemia type  Not watching diet at all. does very little exercise  4. Generalized anxiety disorder  Is on xanax prn- has not needed as of late. She only takes about 1x a month.    Outpatient Encounter Medications as of 12/05/2017  Medication Sig  . ALPRAZolam (XANAX) 0.25 MG tablet Take 1 tablet (0.25 mg total) by mouth 2 (two) times daily as needed for sleep.  . pantoprazole (PROTONIX) 40 MG tablet Take 1 tablet (40 mg total) by mouth daily.  . simvastatin (ZOCOR) 40 MG tablet TAKE 1 TABLET BY MOUTH ONCE DAILY  . Vitamins A & D 5000-400 UNITS CAPS       New complaints: None today  Social history: husband has lung cancer and is really sick. Helps take care of her grandchildren.    Review of Systems  Constitutional: Negative for activity change and appetite change.  HENT: Negative.   Eyes: Negative for pain.  Respiratory: Negative for shortness of breath.   Cardiovascular: Negative for chest pain, palpitations and leg swelling.  Gastrointestinal: Negative for abdominal pain.  Endocrine: Negative for polydipsia.  Genitourinary: Negative.   Skin: Negative for rash.  Neurological: Negative for dizziness, weakness and headaches.  Hematological: Does not bruise/bleed easily.  Psychiatric/Behavioral: Negative.   All other systems reviewed and are negative.      Objective:   Physical Exam  Constitutional: She is oriented to person, place, and time.  HENT:  Head: Normocephalic.  Nose: Nose normal.    Mouth/Throat: Oropharynx is clear and moist.  Eyes: Pupils are equal, round, and reactive to light. EOM are normal.  Neck: Normal range of motion. Neck supple. No JVD present. Carotid bruit is not present.  Cardiovascular: Normal rate, regular rhythm, normal heart sounds and intact distal pulses.  Pulmonary/Chest: Effort normal and breath sounds normal. No respiratory distress. She has no wheezes. She has no rales. She exhibits no tenderness.  Abdominal: Soft. Normal appearance, normal aorta and bowel sounds are normal. She exhibits no distension, no abdominal bruit, no pulsatile midline mass and no mass. There is no splenomegaly or hepatomegaly. There is no tenderness.  Musculoskeletal: Normal range of motion. She exhibits no edema.  Lymphadenopathy:    She has no cervical adenopathy.  Neurological: She is alert and oriented to person, place, and time. She has normal reflexes.  Skin: Skin is warm and dry.  Psychiatric: Judgment normal.  Nursing note and vitals reviewed.   .BP 138/82 (BP Location: Left Arm, Cuff Size: Normal)   Pulse 82   Temp (!) 96.9 F (36.1 C) (Oral)   Ht _0  (1.6 m)   Wt 124 lb (56.2 kg)   BMI 21.97 kg/m         Assessment & Plan:  Courtney Johnson comes in today with chief complaint of Medical Management of Chronic Issues   Diagnosis and orders addressed:  1. Gastroesophageal reflux disease, esophagitis presence not specified Avoid spicy foods Do not eat 2 hours prior to bedtime  2. Osteopenia, unspecified location Weight bearing exercise encouraged - DG WRFM DEXA  3. Hyperlipidemia, unspecified hyperlipidemia type Low fat diet - CMP14+EGFR - Lipid panel  4. Generalized anxiety disorder Stress management   Labs pending Health Maintenance reviewed Diet and exercise encouraged  Follow up plan: 6 months   Mary-Margaret Hassell Done, FNP

## 2017-12-05 NOTE — Patient Instructions (Signed)
Bone Health Bones protect organs, store calcium, and anchor muscles. Good health habits, such as eating nutritious foods and exercising regularly, are important for maintaining healthy bones. They can also help to prevent a condition that causes bones to lose density and become weak and brittle (osteoporosis). Why is bone mass important? Bone mass refers to the amount of bone tissue that you have. The higher your bone mass, the stronger your bones. An important step toward having healthy bones throughout life is to have strong and dense bones during childhood. A young adult who has a high bone mass is more likely to have a high bone mass later in life. Bone mass at its greatest it is called peak bone mass. A large decline in bone mass occurs in older adults. In women, it occurs about the time of menopause. During this time, it is important to practice good health habits, because if more bone is lost than what is replaced, the bones will become less healthy and more likely to break (fracture). If you find that you have a low bone mass, you may be able to prevent osteoporosis or further bone loss by changing your diet and lifestyle. How can I find out if my bone mass is low? Bone mass can be measured with an X-ray test that is called a bone mineral density (BMD) test. This test is recommended for all women who are age 65 or older. It may also be recommended for men who are age 70 or older, or for people who are more likely to develop osteoporosis due to:  Having bones that break easily.  Having a long-term disease that weakens bones, such as kidney disease or rheumatoid arthritis.  Having menopause earlier than normal.  Taking medicine that weakens bones, such as steroids, thyroid hormones, or hormone treatment for breast cancer or prostate cancer.  Smoking.  Drinking three or more alcoholic drinks each day.  What are the nutritional recommendations for healthy bones? To have healthy bones, you  need to get enough of the right minerals and vitamins. Most nutrition experts recommend getting these nutrients from the foods that you eat. Nutritional recommendations vary from person to person. Ask your health care provider what is healthy for you. Here are some general guidelines. Calcium Recommendations Calcium is the most important (essential) mineral for bone health. Most people can get enough calcium from their diet, but supplements may be recommended for people who are at risk for osteoporosis. Good sources of calcium include:  Dairy products, such as low-fat or nonfat milk, cheese, and yogurt.  Dark green leafy vegetables, such as bok choy and broccoli.  Calcium-fortified foods, such as orange juice, cereal, bread, soy beverages, and tofu products.  Nuts, such as almonds.  Follow these recommended amounts for daily calcium intake:  Children, age 1?3: 700 mg.  Children, age 4?8: 1,000 mg.  Children, age 9?13: 1,300 mg.  Teens, age 14?18: 1,300 mg.  Adults, age 19?50: 1,000 mg.  Adults, age 51?70: ? Men: 1,000 mg. ? Women: 1,200 mg.  Adults, age 71 or older: 1,200 mg.  Pregnant and breastfeeding females: ? Teens: 1,300 mg. ? Adults: 1,000 mg.  Vitamin D Recommendations Vitamin D is the most essential vitamin for bone health. It helps the body to absorb calcium. Sunlight stimulates the skin to make vitamin D, so be sure to get enough sunlight. If you live in a cold climate or you do not get outside often, your health care provider may recommend that you take vitamin   D supplements. Good sources of vitamin D in your diet include:  Egg yolks.  Saltwater fish.  Milk and cereal fortified with vitamin D.  Follow these recommended amounts for daily vitamin D intake:  Children and teens, age 1?18: 600 international units.  Adults, age 50 or younger: 400-800 international units.  Adults, age 51 or older: 800-1,000 international units.  Other Nutrients Other nutrients  for bone health include:  Phosphorus. This mineral is found in meat, poultry, dairy foods, nuts, and legumes. The recommended daily intake for adult men and adult women is 700 mg.  Magnesium. This mineral is found in seeds, nuts, dark green vegetables, and legumes. The recommended daily intake for adult men is 400?420 mg. For adult women, it is 310?320 mg.  Vitamin K. This vitamin is found in green leafy vegetables. The recommended daily intake is 120 mg for adult men and 90 mg for adult women.  What type of physical activity is best for building and maintaining healthy bones? Weight-bearing and strength-building activities are important for building and maintaining peak bone mass. Weight-bearing activities cause muscles and bones to work against gravity. Strength-building activities increases muscle strength that supports bones. Weight-bearing and muscle-building activities include:  Walking and hiking.  Jogging and running.  Dancing.  Gym exercises.  Lifting weights.  Tennis and racquetball.  Climbing stairs.  Aerobics.  Adults should get at least 30 minutes of moderate physical activity on most days. Children should get at least 60 minutes of moderate physical activity on most days. Ask your health care provide what type of exercise is best for you. Where can I find more information? For more information, check out the following websites:  National Osteoporosis Foundation: http://nof.org/learn/basics  National Institutes of Health: http://www.niams.nih.gov/Health_Info/Bone/Bone_Health/bone_health_for_life.asp  This information is not intended to replace advice given to you by your health care provider. Make sure you discuss any questions you have with your health care provider. Document Released: 08/28/2003 Document Revised: 12/26/2015 Document Reviewed: 06/12/2014 Elsevier Interactive Patient Education  2018 Elsevier Inc.  

## 2017-12-06 LAB — CMP14+EGFR
ALBUMIN: 4.5 g/dL (ref 3.5–4.8)
ALT: 19 IU/L (ref 0–32)
AST: 23 IU/L (ref 0–40)
Albumin/Globulin Ratio: 1.8 (ref 1.2–2.2)
Alkaline Phosphatase: 81 IU/L (ref 39–117)
BUN/Creatinine Ratio: 11 — ABNORMAL LOW (ref 12–28)
BUN: 8 mg/dL (ref 8–27)
Bilirubin Total: 0.4 mg/dL (ref 0.0–1.2)
CO2: 23 mmol/L (ref 20–29)
CREATININE: 0.76 mg/dL (ref 0.57–1.00)
Calcium: 9.8 mg/dL (ref 8.7–10.3)
Chloride: 106 mmol/L (ref 96–106)
GFR, EST AFRICAN AMERICAN: 92 mL/min/{1.73_m2} (ref >59)
GFR, EST NON AFRICAN AMERICAN: 80 mL/min/{1.73_m2} (ref >59)
GLOBULIN, TOTAL: 2.5 g/dL (ref 1.5–4.5)
Glucose: 91 mg/dL (ref 65–99)
Potassium: 4.1 mmol/L (ref 3.5–5.2)
SODIUM: 146 mmol/L — AB (ref 134–144)
TOTAL PROTEIN: 7 g/dL (ref 6.0–8.5)

## 2017-12-06 LAB — LIPID PANEL
CHOL/HDL RATIO: 2.6 ratio (ref 0.0–4.4)
Cholesterol, Total: 157 mg/dL (ref 100–199)
HDL: 61 mg/dL (ref >39)
LDL CALC: 70 mg/dL (ref 0–99)
Triglycerides: 129 mg/dL (ref 0–149)
VLDL Cholesterol Cal: 26 mg/dL (ref 5–40)

## 2017-12-06 LAB — HEPATITIS C ANTIBODY: Hep C Virus Ab: <0.1 s/co ratio (ref 0.0–0.9)

## 2017-12-08 ENCOUNTER — Encounter: Payer: Self-pay | Admitting: Nurse Practitioner

## 2017-12-08 ENCOUNTER — Ambulatory Visit (INDEPENDENT_AMBULATORY_CARE_PROVIDER_SITE_OTHER): Payer: Medicare Other | Admitting: Nurse Practitioner

## 2017-12-08 VITALS — BP 136/84 | HR 68 | Temp 96.8°F | Ht 63.0 in | Wt 125.0 lb

## 2017-12-08 DIAGNOSIS — M810 Age-related osteoporosis without current pathological fracture: Secondary | ICD-10-CM | POA: Diagnosis not present

## 2017-12-08 MED ORDER — ALENDRONATE SODIUM 70 MG PO TABS: 70.0000 mg | ORAL_TABLET | ORAL | 5 refills | Status: DC

## 2017-12-08 NOTE — Progress Notes (Signed)
Patient ID: Courtney Johnson, female   DOB: [birth year], 72 y.o.   MRN: [id number]  Chief Complaint discuss dexascan results which is showing osteoporosis.  Current Height:   5'3"     Max Lifetime Height:  5'3" Current Weight:     125lbs    Ethnicity:Caucasian  BP:    136/84   HR:      68   HPI: Does pt already have a diagnosis of:  Osteopenia?  Yes Osteoporosis?  Yes- does now as of dexascan on 12/05/17  Back Pain?  No       Kyphosis?  No Prior fracture?  No Med(s) for Osteoporosis/Osteopenia:  Is taking vitamin d daily- cannot tolerate calcium Med(s) previously tried for Osteoporosis/Osteopenia:                                                              PMH: Age at menopause:  [age] Hysterectomy? yes Oophorectomy?  No HRT? No Steroid Use?  No Thyroid med?  No History of cancer?  No History of digestive disorders (ie Crohn's)?  Yes- gerd Current or previous eating disorders?  No Last Vitamin D Result: not done because insurance will not pay  Last GFR Result:  80  FH/SH: Family history of osteoporosis?  Yes - her sister is on alendronate Parent with history of hip fracture?  No Family history of breast cancer?  No Exercise?  Yes but has ot been exercising asof late because her husband is sick and she cannot leave him often Smoking?  No Alcohol?  No   Calcium Assessment Calcium Intake  # of servings/day  Calcium mg Milk (8 oz) 1  x  300  = 300 Yogurt (4 oz) .5 x  200 = 100 Cheese (1 oz) 0 x  200 = 0 Other Calcium sources   250mg  Ca supplement 0 = 0  Estimated calcium intake per day 650   DEXA Results Date of Test T-Score for AP Spine L1-L4 T-Score for Total Left Hip T-Score for Total Right Hip  12/05/17 -2.7                                FRAX 10 year estimate: Total FX risk:  11%  (consider medication if >/= 20%) Hip FX risk:  3%  (consider medication if >/= 3%)  Assessment: osteoporosis  Recommendations: 1.  Start  alendronate (FOSAMAX)  2.   recommend calcium 1200mg  daily through supplementation or diet.  3.  recommend weight bearing exercise - 30 minutes at least 4 days per week.   4.  Counseled and educated about fall risk and prevention.  Recheck DEXA:  2 years  Time spent counseling patient:  25 minutes  Mary-Margaret Daphine DeutscherMartin, FNP

## 2017-12-08 NOTE — Patient Instructions (Signed)
Osteoporosis Osteoporosis happens when your bones become thinner and weaker. Weak bones can break (fracture) more easily when you slip or fall. Bones most at risk of breaking are in the hip, wrist, and spine. Follow these instructions at home:  Get enough calcium and vitamin D. These nutrients are good for your bones.  Exercise as told by your doctor.  Do not use any tobacco products. This includes cigarettes, chewing tobacco, and electronic cigarettes. If you need help quitting, ask your doctor.  Limit the amount of alcohol you drink.  Take medicines only as told by your doctor.  Keep all follow-up visits as told by your doctor. This is important.  Take care at home to prevent falls. Some ways to do this are: ? Keep rooms well lit and tidy. ? Put safety rails on your stairs. ? Put a rubber mat in the bathroom and other places that are often wet or slippery. Get help right away if:  You fall.  You hurt yourself. This information is not intended to replace advice given to you by your health care provider. Make sure you discuss any questions you have with your health care provider. Document Released: 08/30/2011 Document Revised: 11/13/2015 Document Reviewed: 11/15/2013 Elsevier Interactive Patient Education  2018 Elsevier Inc.  

## 2017-12-17 ENCOUNTER — Telehealth: Payer: Self-pay | Admitting: Nurse Practitioner

## 2017-12-17 MED ORDER — AMOXICILLIN-POT CLAVULANATE 875-125 MG PO TABS: 1.0000 | ORAL_TABLET | Freq: Two times a day (BID) | ORAL | 0 refills | Status: DC

## 2017-12-17 NOTE — Telephone Encounter (Signed)
Attempted to contact patient x 2 line busy

## 2017-12-17 NOTE — Telephone Encounter (Signed)
Patient seen MMM 6/20 and states she is no better. C/o teethe pain, nasal congestion, and  Cough. [facility] Please advise

## 2017-12-26 ENCOUNTER — Telehealth: Payer: Self-pay | Admitting: Nurse Practitioner

## 2017-12-26 DIAGNOSIS — R0989 Other specified symptoms and signs involving the circulatory and respiratory systems: Secondary | ICD-10-CM

## 2017-12-26 NOTE — Telephone Encounter (Signed)
Ok to do referral?  

## 2017-12-26 NOTE — Telephone Encounter (Signed)
Pt would like referral to ENT for her constant sinus problem. Ok to place referral?

## 2017-12-27 NOTE — Telephone Encounter (Signed)
Patient informed via voicemail that ENT referral will be done

## 2018-01-26 ENCOUNTER — Other Ambulatory Visit: Payer: Self-pay | Admitting: Nurse Practitioner

## 2018-01-26 ENCOUNTER — Other Ambulatory Visit: Payer: Self-pay

## 2018-01-26 DIAGNOSIS — J019 Acute sinusitis, unspecified: Secondary | ICD-10-CM | POA: Diagnosis not present

## 2018-01-26 DIAGNOSIS — Z1239 Encounter for other screening for malignant neoplasm of breast: Secondary | ICD-10-CM

## 2018-01-26 DIAGNOSIS — J349 Unspecified disorder of nose and nasal sinuses: Secondary | ICD-10-CM | POA: Diagnosis not present

## 2018-03-02 ENCOUNTER — Other Ambulatory Visit: Payer: Self-pay | Admitting: Nurse Practitioner

## 2018-03-02 DIAGNOSIS — E785 Hyperlipidemia, unspecified: Secondary | ICD-10-CM

## 2018-04-04 ENCOUNTER — Ambulatory Visit (INDEPENDENT_AMBULATORY_CARE_PROVIDER_SITE_OTHER): Payer: Medicare Other | Admitting: Family

## 2018-04-04 ENCOUNTER — Encounter: Payer: Self-pay | Admitting: Family

## 2018-04-04 VITALS — BP 134/85 | HR 83 | Temp 97.6°F | Ht 63.0 in | Wt 125.4 lb

## 2018-04-04 DIAGNOSIS — R109 Unspecified abdominal pain: Secondary | ICD-10-CM

## 2018-04-04 DIAGNOSIS — N3001 Acute cystitis with hematuria: Secondary | ICD-10-CM

## 2018-04-04 LAB — URINALYSIS, COMPLETE
Bilirubin, UA: NEGATIVE
GLUCOSE, UA: NEGATIVE
Ketones, UA: NEGATIVE
NITRITE UA: NEGATIVE
Protein, UA: NEGATIVE
Specific Gravity, UA: 1.015 (ref 1.005–1.030)
UUROB: 0.2 mg/dL (ref 0.2–1.0)
pH, UA: 5.5 (ref 5.0–7.5)

## 2018-04-04 LAB — MICROSCOPIC EXAMINATION: Renal Epithel, UA: NONE SEEN /hpf

## 2018-04-04 MED ORDER — CEPHALEXIN 500 MG PO CAPS: 500.0000 mg | ORAL_CAPSULE | Freq: Two times a day (BID) | ORAL | 0 refills | Status: DC

## 2018-04-04 NOTE — Patient Instructions (Signed)

## 2018-04-04 NOTE — Progress Notes (Signed)
   Subjective:    Patient ID: Courtney Johnson, female    DOB: 1945/07/12, 72 y.o.   MRN: [id number]  Chief Complaint  Patient presents with  . abdominal pressure and frequent urinating    Urinary Tract Infection   This is a new problem. The current episode started in the past 7 days. The problem occurs every urination. The problem has been gradually worsening. The quality of the pain is described as burning. The pain is at a severity of 6/10. The pain is moderate. Associated symptoms include frequency, hesitancy and urgency. Pertinent negatives include no flank pain, hematuria, nausea or vomiting. She has tried increased fluids for the symptoms. The treatment provided no relief.      Review of Systems  Gastrointestinal: Negative for nausea and vomiting.  Genitourinary: Positive for frequency, hesitancy and urgency. Negative for flank pain and hematuria.  All other systems reviewed and are negative.      Objective:   Physical Exam  Constitutional: She is oriented to person, place, and time. She appears well-developed and well-nourished. No distress.  HENT:  Head: Normocephalic.  Eyes: Pupils are equal, round, and reactive to light.  Neck: Normal range of motion. Neck supple. No thyromegaly present.  Cardiovascular: Normal rate, regular rhythm, normal heart sounds and intact distal pulses.  No murmur heard. Pulmonary/Chest: Effort normal and breath sounds normal. No respiratory distress. She has no wheezes.  Abdominal: Soft. Bowel sounds are normal. She exhibits no distension. There is tenderness (mild lower abdomen).  Musculoskeletal: Normal range of motion. She exhibits no edema or tenderness.  Mild CVA tenderness   Neurological: She is alert and oriented to person, place, and time. She has normal reflexes. No cranial nerve deficit.  Skin: Skin is warm and dry.  Psychiatric: She has a normal mood and affect. Her behavior is normal. Judgment and thought content normal.  Vitals  reviewed.     BP 134/85   Pulse 83   Temp 97.6 F (36.4 C) (Oral)   Ht 5\' 3"  (1.6 m)   Wt 125 lb 6.4 oz (56.9 kg)   BMI 22.21 kg/m      Assessment & Plan:  ISLAH EVE comes in today with chief complaint of abdominal pressure and frequent urinating   Diagnosis and orders addressed:  1. Abdominal pressure - Urinalysis, Complete  2. Acute cystitis with hematuria Force fluids RTO prn Culture pending - Urine Culture - cephALEXin (KEFLEX) 500 MG capsule; Take 1 capsule (500 mg total) by mouth 2 (two) times daily.  Dispense: 14 capsule; Refill: 0   Jannifer Rodney, FNP

## 2018-04-06 LAB — URINE CULTURE

## 2018-04-11 ENCOUNTER — Telehealth: Payer: Self-pay | Admitting: Family

## 2018-04-13 MED ORDER — SULFAMETHOXAZOLE-TRIMETHOPRIM 800-160 MG PO TABS: 1.0000 | ORAL_TABLET | Freq: Two times a day (BID) | ORAL | 0 refills | Status: DC

## 2018-04-13 NOTE — Telephone Encounter (Signed)
No better with UTI  Pharmacy [facility]

## 2018-04-13 NOTE — Telephone Encounter (Signed)
Bactrim Prescription sent to pharmacy  °

## 2018-04-13 NOTE — Telephone Encounter (Signed)
Pt aware.

## 2018-04-21 ENCOUNTER — Encounter: Payer: Self-pay | Admitting: Nurse Practitioner

## 2018-04-21 ENCOUNTER — Ambulatory Visit (INDEPENDENT_AMBULATORY_CARE_PROVIDER_SITE_OTHER): Payer: Medicare Other | Admitting: Nurse Practitioner

## 2018-04-21 VITALS — BP 138/88 | HR 80 | Temp 97.0°F | Ht 63.0 in | Wt 124.0 lb

## 2018-04-21 DIAGNOSIS — R300 Dysuria: Secondary | ICD-10-CM | POA: Diagnosis not present

## 2018-04-21 DIAGNOSIS — N3000 Acute cystitis without hematuria: Secondary | ICD-10-CM

## 2018-04-21 LAB — URINALYSIS, COMPLETE
Bilirubin, UA: NEGATIVE
Glucose, UA: NEGATIVE
KETONES UA: NEGATIVE
NITRITE UA: NEGATIVE
Protein, UA: NEGATIVE
RBC, UA: NEGATIVE
Specific Gravity, UA: <1.005 — ABNORMAL LOW (ref 1.005–1.030)
Urobilinogen, Ur: 0.2 mg/dL (ref 0.2–1.0)
pH, UA: 7 (ref 5.0–7.5)

## 2018-04-21 LAB — MICROSCOPIC EXAMINATION
BACTERIA UA: NONE SEEN
Renal Epithel, UA: NONE SEEN /hpf

## 2018-04-21 MED ORDER — CIPROFLOXACIN HCL 500 MG PO TABS: 500.0000 mg | ORAL_TABLET | Freq: Two times a day (BID) | ORAL | 0 refills | Status: DC

## 2018-04-21 NOTE — Patient Instructions (Signed)

## 2018-04-21 NOTE — Progress Notes (Signed)
   Subjective:    Patient ID: ALIYANNA WASSMER, female    DOB: 08-Aug-1945, 72 y.o.   MRN: [id number]   Chief Complaint: Dysuria and Abdominal Pain   HPI Patient was seen on 04/04/18 with dysuria. She was diagnosed with UTI and was given keflex. urien culture grew ecoli which keflex should have taken care of. She comes in today c/o suprapubic pressure and feels like she need sto pee often. Some dysuria.   Review of Systems  Constitutional: Negative.   Respiratory: Negative.   Cardiovascular: Negative.   Genitourinary: Positive for dysuria, frequency, pelvic pain and urgency.  Neurological: Negative.   Psychiatric/Behavioral: Negative.   All other systems reviewed and are negative.      Objective:   Physical Exam  Constitutional: She appears well-developed and well-nourished.  Pulmonary/Chest: Effort normal.  Abdominal: Normal appearance and bowel sounds are normal.  Skin: Skin is warm.    BP 138/88   Pulse 80   Temp (!) 97 F (36.1 C) (Oral)   Ht 5\' 3"  (1.6 m)   Wt 124 lb (56.2 kg)   BMI 21.97 kg/m   Urine trace of leuks     Assessment & Plan:  [name] in today with chief complaint of Dysuria and Abdominal Pain   1. Dysuria - Urinalysis, Complete  2. Acute cystitis without hematuria Take medication as prescribe Cotton underwear Take shower not bath Cranberry juice, yogurt Force fluids AZO over the counter X2 days Culture pending RTO prn  Meds ordered this encounter  Medications  . ciprofloxacin (CIPRO) 500 MG tablet    Sig: Take 1 tablet (500 mg total) by mouth 2 (two) times daily.    Dispense:  10 tablet    Refill:  0    Order Specific Question:   Supervising Provider    Answer:   Johna Sheriff [[id number]]    [name], FNP

## 2018-04-22 LAB — URINE CULTURE: Organism ID, Bacteria: NO GROWTH

## 2018-04-27 ENCOUNTER — Telehealth: Payer: Self-pay

## 2018-04-27 DIAGNOSIS — R1084 Generalized abdominal pain: Secondary | ICD-10-CM

## 2018-04-27 NOTE — Telephone Encounter (Signed)
Referral placed pt aware 

## 2018-04-27 NOTE — Telephone Encounter (Signed)
Ok for referral?

## 2018-04-27 NOTE — Telephone Encounter (Signed)
Patient is requesting a referral to her GI (across from [facility]) for ongoing abdominal pain

## 2018-05-02 ENCOUNTER — Ambulatory Visit: Payer: Medicare Other

## 2018-05-04 ENCOUNTER — Ambulatory Visit (INDEPENDENT_AMBULATORY_CARE_PROVIDER_SITE_OTHER): Payer: Medicare Other | Admitting: *Deleted

## 2018-05-04 DIAGNOSIS — Z23 Encounter for immunization: Secondary | ICD-10-CM

## 2018-05-23 DIAGNOSIS — H2513 Age-related nuclear cataract, bilateral: Secondary | ICD-10-CM | POA: Diagnosis not present

## 2018-05-25 DIAGNOSIS — H35033 Hypertensive retinopathy, bilateral: Secondary | ICD-10-CM | POA: Diagnosis not present

## 2018-05-25 DIAGNOSIS — H26493 Other secondary cataract, bilateral: Secondary | ICD-10-CM | POA: Diagnosis not present

## 2018-05-25 DIAGNOSIS — Z961 Presence of intraocular lens: Secondary | ICD-10-CM | POA: Diagnosis not present

## 2018-05-25 DIAGNOSIS — H26491 Other secondary cataract, right eye: Secondary | ICD-10-CM | POA: Diagnosis not present

## 2018-05-25 DIAGNOSIS — H353131 Nonexudative age-related macular degeneration, bilateral, early dry stage: Secondary | ICD-10-CM | POA: Diagnosis not present

## 2018-06-02 ENCOUNTER — Ambulatory Visit: Payer: Medicare Other | Admitting: Nurse Practitioner

## 2018-06-12 ENCOUNTER — Encounter: Payer: Self-pay | Admitting: Nurse Practitioner

## 2018-06-15 ENCOUNTER — Other Ambulatory Visit: Payer: Self-pay | Admitting: Nurse Practitioner

## 2018-06-15 DIAGNOSIS — M810 Age-related osteoporosis without current pathological fracture: Secondary | ICD-10-CM

## 2018-06-27 ENCOUNTER — Encounter: Payer: Self-pay | Admitting: Nurse Practitioner

## 2018-06-27 ENCOUNTER — Ambulatory Visit (INDEPENDENT_AMBULATORY_CARE_PROVIDER_SITE_OTHER): Payer: Medicare Other | Admitting: Nurse Practitioner

## 2018-06-27 VITALS — BP 134/84 | HR 82 | Temp 97.3°F | Ht 63.0 in | Wt 127.0 lb

## 2018-06-27 DIAGNOSIS — F411 Generalized anxiety disorder: Secondary | ICD-10-CM | POA: Diagnosis not present

## 2018-06-27 DIAGNOSIS — E785 Hyperlipidemia, unspecified: Secondary | ICD-10-CM

## 2018-06-27 DIAGNOSIS — M810 Age-related osteoporosis without current pathological fracture: Secondary | ICD-10-CM

## 2018-06-27 DIAGNOSIS — Z9889 Other specified postprocedural states: Secondary | ICD-10-CM | POA: Diagnosis not present

## 2018-06-27 DIAGNOSIS — K219 Gastro-esophageal reflux disease without esophagitis: Secondary | ICD-10-CM | POA: Diagnosis not present

## 2018-06-27 MED ORDER — ALPRAZOLAM 0.25 MG PO TABS: 0.2500 mg | ORAL_TABLET | Freq: Two times a day (BID) | ORAL | 1 refills | Status: DC | PRN

## 2018-06-27 MED ORDER — SIMVASTATIN 40 MG PO TABS: 40.0000 mg | ORAL_TABLET | Freq: Every day | ORAL | 1 refills | Status: DC

## 2018-06-27 NOTE — Progress Notes (Signed)
Subjective:    Patient ID: Courtney Johnson, female    DOB: 25-Feb-1946, 73 y.o.   MRN: [id number]   Chief Complaint: medical management of chronic issues  HPI:  1. Gastroesophageal reflux disease, esophagitis presence not specified  Is on protonix daily. She says that is the only med that will keep her symptoms under control.  2. Hyperlipidemia, unspecified hyperlipidemia type  She really does not watch her diet and does no exercise. She says that she does stay busy   3. Generalized anxiety disorder  Takes xanax as needed. Some days she does not take at all and other days she takes it twice.  4. Osteoporosis, age related Last dexascan was done 12/05/17 with t score of -2.7. she takes daily vitamin d and calcium supplement. She has been on fosamax since summer    Outpatient Encounter Medications as of 06/27/2018  Medication Sig  . alendronate (FOSAMAX) 70 MG tablet TAKE 1 TABLET BY MOUTH ONCE A WEEK TAKE  WITH  A  FULL  GLASS  OF  WATER  ON  AN  EMPTY  STOMACH  . ALPRAZolam (XANAX) 0.25 MG tablet Take 1 tablet (0.25 mg total) by mouth 2 (two) times daily as needed for sleep.  . ciprofloxacin (CIPRO) 500 MG tablet Take 1 tablet (500 mg total) by mouth 2 (two) times daily.  . fluticasone (FLONASE) 50 MCG/ACT nasal spray Place 2 sprays into both nostrils daily.  . pantoprazole (PROTONIX) 40 MG tablet Take 1 tablet (40 mg total) by mouth daily.  . simvastatin (ZOCOR) 40 MG tablet TAKE 1 TABLET BY MOUTH ONCE DAILY  . Vitamins A & D 5000-400 UNITS CAPS        New complaints: None today  Social history: Husband has lung cancer and he has needed a lot of cre the last month. He is still on chemo.   Review of Systems  Constitutional: Negative for activity change and appetite change.  HENT: Negative.   Eyes: Negative for pain.  Respiratory: Negative for shortness of breath.   Cardiovascular: Negative for chest pain, palpitations and leg swelling.  Gastrointestinal: Negative for  abdominal pain.  Endocrine: Negative for polydipsia.  Genitourinary: Negative.   Skin: Negative for rash.  Neurological: Negative for dizziness, weakness and headaches.  Hematological: Does not bruise/bleed easily.  Psychiatric/Behavioral: Negative.   All other systems reviewed and are negative.      Objective:   Physical Exam Vitals signs and nursing note reviewed.  Constitutional:      General: She is not in acute distress.    Appearance: Normal appearance. She is well-developed.  HENT:     Head: Normocephalic.     Nose: Nose normal.  Eyes:     Pupils: Pupils are equal, round, and reactive to light.  Neck:     Musculoskeletal: Normal range of motion and neck supple.     Vascular: No carotid bruit or JVD.  Cardiovascular:     Rate and Rhythm: Normal rate and regular rhythm.     Heart sounds: Normal heart sounds.  Pulmonary:     Effort: Pulmonary effort is normal. No respiratory distress.     Breath sounds: Normal breath sounds. No wheezing or rales.  Chest:     Chest wall: No tenderness.  Abdominal:     General: Bowel sounds are normal. There is no distension or abdominal bruit.     Palpations: Abdomen is soft. There is no hepatomegaly, splenomegaly, mass or pulsatile mass.  Tenderness: There is no abdominal tenderness.  Musculoskeletal: Normal range of motion.  Lymphadenopathy:     Cervical: No cervical adenopathy.  Skin:    General: Skin is warm and dry.  Neurological:     Mental Status: She is alert and oriented to person, place, and time.     Deep Tendon Reflexes: Reflexes are normal and symmetric.  Psychiatric:        Behavior: Behavior normal.        Thought Content: Thought content normal.        Judgment: Judgment normal.    BP 134/84 (BP Location: Left Arm, Cuff Size: Normal)   Pulse 82   Temp (!) 97.3 F (36.3 C) (Oral)   Ht 5' 3"  (1.6 m)   Wt 127 lb (57.6 kg)   BMI 22.50 kg/m       Assessment & Plan:  TYSHAUNA FINKBINER comes in today with  chief complaint of Medical Management of Chronic Issues   Diagnosis and orders addressed:  1. Gastroesophageal reflux disease, esophagitis presence not specified Avoid spicy foods Do not eat 2 hours prior to bedtime  2. Hyperlipidemia, unspecified hyperlipidemia type Low fat diet - simvastatin (ZOCOR) 40 MG tablet; Take 1 tablet (40 mg total) by mouth daily.  Dispense: 90 tablet; Refill: 1 - CMP14+EGFR - Lipid panel  3. Generalized anxiety disorder Stress management  4. Age-related osteoporosis without current pathological fracture Weight bearing exercises  5. GAD (generalized anxiety disorder) Stress management - ALPRAZolam (XANAX) 0.25 MG tablet; Take 1 tablet (0.25 mg total) by mouth 2 (two) times daily as needed for sleep.  Dispense: 60 tablet; Refill: 1   Labs pending Health Maintenance reviewed Diet and exercise encouraged  Follow up plan: 6 month    [place], FNP

## 2018-06-27 NOTE — Addendum Note (Signed)
Addended by: Bennie Pierini on: 06/27/2018 12:29 PM   Modules accepted: Orders

## 2018-06-27 NOTE — Patient Instructions (Signed)

## 2018-06-27 NOTE — Addendum Note (Signed)
Addended by: Bennie Pierini on: 06/27/2018 12:30 PM   Modules accepted: Orders

## 2018-06-28 LAB — CMP14+EGFR
ALK PHOS: 59 IU/L (ref 39–117)
ALT: 18 IU/L (ref 0–32)
AST: 24 IU/L (ref 0–40)
Albumin/Globulin Ratio: 2 (ref 1.2–2.2)
Albumin: 4.6 g/dL (ref 3.5–4.8)
BUN/Creatinine Ratio: 13 (ref 12–28)
BUN: 10 mg/dL (ref 8–27)
Bilirubin Total: 0.3 mg/dL (ref 0.0–1.2)
CALCIUM: 9.3 mg/dL (ref 8.7–10.3)
CO2: 23 mmol/L (ref 20–29)
CREATININE: 0.78 mg/dL (ref 0.57–1.00)
Chloride: 106 mmol/L (ref 96–106)
GFR calc Af Amer: 88 mL/min/{1.73_m2} (ref >59)
GFR, EST NON AFRICAN AMERICAN: 77 mL/min/{1.73_m2} (ref >59)
GLUCOSE: 89 mg/dL (ref 65–99)
Globulin, Total: 2.3 g/dL (ref 1.5–4.5)
Potassium: 4.2 mmol/L (ref 3.5–5.2)
Sodium: 144 mmol/L (ref 134–144)
Total Protein: 6.9 g/dL (ref 6.0–8.5)

## 2018-06-28 LAB — LIPID PANEL
CHOL/HDL RATIO: 4.5 ratio — AB (ref 0.0–4.4)
CHOLESTEROL TOTAL: 268 mg/dL — AB (ref 100–199)
HDL: 59 mg/dL (ref >39)
LDL CALC: 178 mg/dL — AB (ref 0–99)
Triglycerides: 154 mg/dL — ABNORMAL HIGH (ref 0–149)
VLDL Cholesterol Cal: 31 mg/dL (ref 5–40)

## 2018-06-29 ENCOUNTER — Encounter (INDEPENDENT_AMBULATORY_CARE_PROVIDER_SITE_OTHER): Payer: Self-pay | Admitting: *Deleted

## 2018-07-04 ENCOUNTER — Encounter (INDEPENDENT_AMBULATORY_CARE_PROVIDER_SITE_OTHER): Payer: Self-pay | Admitting: *Deleted

## 2018-07-05 ENCOUNTER — Other Ambulatory Visit: Payer: Self-pay

## 2018-07-05 DIAGNOSIS — Z1211 Encounter for screening for malignant neoplasm of colon: Secondary | ICD-10-CM

## 2018-07-06 ENCOUNTER — Telehealth: Payer: Self-pay | Admitting: Nurse Practitioner

## 2018-07-06 NOTE — Telephone Encounter (Signed)
Change made and pt aware

## 2018-07-07 ENCOUNTER — Encounter: Payer: Self-pay | Admitting: Internal Medicine

## 2018-07-11 ENCOUNTER — Ambulatory Visit (INDEPENDENT_AMBULATORY_CARE_PROVIDER_SITE_OTHER): Payer: Medicare Other

## 2018-07-11 ENCOUNTER — Encounter: Payer: Self-pay | Admitting: Nurse Practitioner

## 2018-07-11 ENCOUNTER — Ambulatory Visit (INDEPENDENT_AMBULATORY_CARE_PROVIDER_SITE_OTHER): Payer: Medicare Other | Admitting: Nurse Practitioner

## 2018-07-11 VITALS — BP 152/82 | HR 101 | Temp 97.2°F | Ht 63.0 in | Wt 127.6 lb

## 2018-07-11 DIAGNOSIS — M25571 Pain in right ankle and joints of right foot: Secondary | ICD-10-CM

## 2018-07-11 NOTE — Progress Notes (Signed)
   Subjective:    Patient ID: Courtney Johnson, female    DOB: 01-06-1946, 73 y.o.   MRN: [id number]   Chief Complaint: Ankle Pain (Right ankle pain and swelling x 1 week)   HPI Patient come sin c/o right ankle pain and swelling. Started 4 days ago. She does not remember any injury. Painful when walking. Has gotten a little better since started but still hurting.   Review of Systems  Constitutional: Negative.   Respiratory: Negative.   Cardiovascular: Negative.   Musculoskeletal: Positive for arthralgias (right ankle).  Neurological: Negative.   Psychiatric/Behavioral: Negative.   All other systems reviewed and are negative.      Objective:   Physical Exam Constitutional:      General: She is not in acute distress.    Appearance: She is obese.  Cardiovascular:     Rate and Rhythm: Normal rate and regular rhythm.  Pulmonary:     Effort: Pulmonary effort is normal.     Breath sounds: Normal breath sounds.  Musculoskeletal:     Comments: mild medial ankle edema right Pain on palpation medial malleous FROM with pain on inversion and eversion.  Skin:    General: Skin is warm.  Neurological:     General: No focal deficit present.     Mental Status: She is alert.  Psychiatric:        Mood and Affect: Mood normal.        Behavior: Behavior normal.    BP (!) 152/82 (BP Location: Left Arm, Cuff Size: Normal)   Pulse (!) 101   Temp (!) 97.2 F (36.2 C) (Oral)   Ht 5\' 3"  (1.6 m)   Wt 127 lb 9.6 oz (57.9 kg)   BMI 22.60 kg/m   Right ankle xray- no fracture-Preliminary reading by Paulene Floor, FNP  [facility]     Assessment & Plan:  Courtney Johnson in today with chief complaint of Ankle Pain (Right ankle pain and swelling x 1 week)   1. Acute right ankle pain Ice bid Elevate when sitting Wrap when up walking around RTO prn - DG Ankle Complete Right; Future  Courtney Daphine Deutscher, FNP

## 2018-07-11 NOTE — Patient Instructions (Signed)
Ankle Pain  The ankle joint holds your body weight and allows you to move around. Ankle pain can occur on either side or the back of one ankle or both ankles. Ankle pain may be sharp and burning or dull and aching. There may be tenderness, stiffness, redness, or warmth around the ankle. Many things can cause ankle pain, including an injury to the area and overuse of the ankle.  Follow these instructions at home:  Activity   Rest your ankle as told by your health care provider. Avoid any activities that cause ankle pain.   Do not use the injured limb to support your body weight until your health care provider says that you can. Use crutches as told by your health care provider.   Do exercises as told by your health care provider.   Ask your health care provider when it is safe to drive if you have a brace on your ankle.  If you have a brace:   Wear the brace as told by your health care provider. Remove it only as told by your health care provider.   Loosen the brace if your toes tingle, become numb, or turn cold and blue.   Keep the brace clean.   If the brace is not waterproof:  ? Do not let it get wet.  ? Cover it with a watertight covering when you take a bath or shower.  If you were given an elastic bandage:     Remove it when you take a bath or a shower.   Try not to move your ankle very much, but wiggle your toes from time to time. This helps to prevent swelling.   Adjust the bandage to make it more comfortable if it feels too tight.   Loosen the bandage if you have numbness or tingling in your foot or if your foot turns cold and blue.  Managing pain, stiffness, and swelling     If directed, put ice on the painful area.  ? If you have a removable brace or elastic bandage, remove it as told by your health care provider.  ? Put ice in a plastic bag.  ? Place a towel between your skin and the bag.  ? Leave the ice on for 20 minutes, 2-3 times a day.   Move your toes often to avoid stiffness and to  lessen swelling.   Raise (elevate) your ankle above the level of your heart while you are sitting or lying down.  General instructions   Record information about your pain. Writing down the following may be helpful for you and your health care provider:  ? How often you have ankle pain.  ? Where the pain is located.  ? What the pain feels like.   If treatment involves wearing a prescribed shoe or insole, make sure you wear it correctly and for as long as told by your health care provider.   Take over-the-counter and prescription medicines only as told by your health care provider.   Keep all follow-up visits as told by your health care provider. This is important.  Contact a health care provider if:   Your pain gets worse.   Your pain is not relieved with medicines.   You have a fever or chills.   You are having more trouble with walking.   You have new symptoms.  Get help right away if:   Your foot, leg, toes, or ankle:  ? Tingles or becomes numb.  ? Becomes   swollen.  ? Turns pale or blue.  Summary   Ankle pain can occur on either side or the back of one ankle or both ankles.   Ankle pain may be sharp and burning or dull and aching.   Rest your ankle as told by your health care provider. If told, apply ice to the area.   Take over-the-counter and prescription medicines only as told by your health care provider.  This information is not intended to replace advice given to you by your health care provider. Make sure you discuss any questions you have with your health care provider.  Document Released: 11/25/2009 Document Revised: 12/14/2017 Document Reviewed: 12/14/2017  Elsevier Interactive Patient Education  2019 [employer]

## 2018-07-17 ENCOUNTER — Ambulatory Visit (INDEPENDENT_AMBULATORY_CARE_PROVIDER_SITE_OTHER): Payer: Medicare Other | Admitting: *Deleted

## 2018-07-17 VITALS — BP 141/83 | HR 87 | Temp 97.2°F | Ht 63.0 in | Wt 129.0 lb

## 2018-07-17 DIAGNOSIS — Z0000 Encounter for general adult medical examination without abnormal findings: Secondary | ICD-10-CM

## 2018-07-17 NOTE — Progress Notes (Addendum)
Subjective:   Courtney Johnson is a 73 y.o. female who presents for Medicare Annual (Subsequent) preventive examination. She is a retired Veterinary surgeon. She enjoys reading and activities with her grandchildren. She is currently not active with any exercise, due to husbands recent diagnosis of lung cancer. She used to go to the gym 3 times weekly. She states that her diet is semi healthy and she typically eats 3 meals a day. She is active in church and lives with her husband, Earvin Hansen. She has 3 children and 5 grandchildren. She does not have any pets and fall hazards were discussed today. She states that her health is about the same as a year ago.   Cardiac Risk Factors include: advanced age (>18men, >25 women);dyslipidemia     Objective:     Vitals: BP (!) 141/83   Pulse 87   Temp (!) 97.2 F (36.2 C) (Oral)   Ht 5\' 3"  (1.6 m)   Wt 129 lb (58.5 kg)   BMI 22.85 kg/m   Body mass index is 22.85 kg/m.  Advanced Directives 07/17/2018  Does Patient Have a Medical Advance Directive? No  Would patient like information on creating a medical advance directive? Yes (MAU/Ambulatory/Procedural Areas - Information given)    Tobacco Social History   Tobacco Use  Smoking Status Never Smoker  Smokeless Tobacco Never Used     Counseling given: Not Answered   Past Medical History:  Diagnosis Date  . Allergy   . Anxiety    not much sleep / stress   . Cataract   . GERD (gastroesophageal reflux disease)   . Hyperlipidemia   . Hypertension    elevated at Dr Isidore Moos = not high at home   . Osteoporosis    Past Surgical History:  Procedure Laterality Date  . ABDOMINAL HYSTERECTOMY     partial   . EYE SURGERY     cataracts removed - bilateral    Family History  Problem Relation Age of Onset  . Other Mother        age and frailty   . Heart disease Father        by-pass  . Heart attack Brother        54   . Heart disease Brother   . Diabetes Daughter   . Colon cancer Sister   . Cancer  Sister        brain tumor - 12  . Heart attack Son        at age 44   Social History   Socioeconomic History  . Marital status: Married    Spouse name: Not on file  . Number of children: 3  . Years of education: Not on file  . Highest education level: Not on file  Occupational History  . Occupation: Veterinary surgeon  Social Needs  . Financial resource strain: Not on file  . Food insecurity:    Worry: Not on file    Inability: Not on file  . Transportation needs:    Medical: Not on file    Non-medical: Not on file  Tobacco Use  . Smoking status: Never Smoker  . Smokeless tobacco: Never Used  Substance and Sexual Activity  . Alcohol use: No  . Drug use: No  . Sexual activity: Not on file  Lifestyle  . Physical activity:    Days per week: Not on file    Minutes per session: Not on file  . Stress: Not on file  Relationships  . Social  connections:    Talks on phone: Not on file    Gets together: Not on file    Attends religious service: Not on file    Active member of club or organization: Not on file    Attends meetings of clubs or organizations: Not on file    Relationship status: Not on file  Other Topics Concern  . Not on file  Social History Narrative  . Not on file    Outpatient Encounter Medications as of 07/17/2018  Medication Sig  . alendronate (FOSAMAX) 70 MG tablet TAKE 1 TABLET BY MOUTH ONCE A WEEK TAKE  WITH  A  FULL  GLASS  OF  WATER  ON  AN  EMPTY  STOMACH  . ALPRAZolam (XANAX) 0.25 MG tablet Take 1 tablet (0.25 mg total) by mouth 2 (two) times daily as needed for sleep.  . Cholecalciferol (VITAMIN D) 50 MCG (2000 UT) tablet Take 2,000 Units by mouth daily.  Marland Kitchen omeprazole (PRILOSEC) 20 MG capsule Take 20 mg by mouth daily. OTC  . simvastatin (ZOCOR) 40 MG tablet Take 1 tablet (40 mg total) by mouth daily.  . [DISCONTINUED] Vitamins A & D 5000-400 UNITS CAPS    No facility-administered encounter medications on file as of 07/17/2018.     Activities of Daily  Living In your present state of health, do you have any difficulty performing the following activities: 07/17/2018  Hearing? N  Vision? N  Difficulty concentrating or making decisions? N  Walking or climbing stairs? N  Dressing or bathing? N  Doing errands, shopping? N  Preparing Food and eating ? N  Using the Toilet? N  In the past six months, have you accidently leaked urine? N  Do you have problems with loss of bowel control? N  Managing your Medications? N  Managing your Finances? N  Housekeeping or managing your Housekeeping? N  Some recent data might be hidden    Patient Care Team: Bennie Pierini, FNP as PCP - General (Nurse Practitioner)    Assessment:   This is a routine wellness examination for Adelena.  Exercise Activities and Dietary recommendations Current Exercise Habits: Home exercise routine, Type of exercise: walking;strength training/weights, Time (Minutes): 30, Frequency (Times/Week): 3, Weekly Exercise (Minutes/Week): 90, Intensity: Mild  Goals    . Prevent falls     Self - love, take some time to care for self. Travel a little        Fall Risk Fall Risk  07/17/2018 07/11/2018 06/27/2018 04/21/2018 12/08/2017  Falls in the past year? 0 0 0 0 No  Number falls in past yr: - - - 0 -  Injury with Fall? - - - 0 -   Is the patient's home free of loose throw rugs in walkways, pet beds, electrical cords, etc?  Fall hazards were discussed at length today    Depression Screen PHQ 2/9 Scores 07/17/2018 07/11/2018 06/27/2018 04/21/2018  PHQ - 2 Score 0 0 0 0     Cognitive Function MMSE - Mini Mental State Exam 07/17/2018  Orientation to time 5  Orientation to Place 5  Registration 3  Attention/ Calculation 5  Recall 3  Language- name 2 objects 2  Language- repeat 1  Language- follow 3 step command 3  Language- read & follow direction 1  Write a sentence 1  Copy design 1  Total score 30        Immunization History  Administered Date(s) Administered   . Influenza, High Dose Seasonal PF 04/28/2016, 04/11/2017,  05/04/2018  . Influenza,inj,Quad PF,6+ Mos 03/28/2013, 04/15/2014, 04/29/2015  . Pneumococcal Conjugate-13 04/15/2014  . Pneumococcal Polysaccharide-23 04/29/2015    Qualifies for Shingles Vaccine?declined today   Screening Tests Health Maintenance  Topic Date Due  . COLONOSCOPY  04/26/2018  . MAMMOGRAM  10/25/2019  . TETANUS/TDAP  11/19/2020  . INFLUENZA VACCINE  Completed  . DEXA SCAN  Completed  . Hepatitis C Screening  Completed  . PNA vac Low Risk Adult  Completed    Cancer Screenings: Lung: Low Dose CT Chest recommended if Age 37-80 years, 30 pack-year currently smoking OR have quit w/in 15years. Patient does not qualify. Breast:  Up to date on Mammogram? Yes   Up to date of Bone Density/Dexa? Yes Colorectal: colon sch'd   Additional Screenings: declined  Hepatitis C Screening:      Plan:   pt is to keep follow up with Corrie DandyMary and other specialist. She is up to date on most HM.    I have personally reviewed and noted the following in the patient's chart:   . Medical and social history . Use of alcohol, tobacco or illicit drugs  . Current medications and supplements . Functional ability and status . Nutritional status . Physical activity . Advanced directives . List of other physicians . Hospitalizations, surgeries, and ER visits in previous 12 months . Vitals . Screenings to include cognitive, depression, and falls . Referrals and appointments  In addition, I have reviewed and discussed with patient certain preventive protocols, quality metrics, and best practice recommendations. A written personalized care plan for preventive services as well as general preventive health recommendations were provided to patient.     Ladarian Bonczek, Almond LintJamie Hundley, LPN  1/61/09601/27/2020   I have reviewed and agree with the above AWV documentation.   Mary-Margaret Daphine DeutscherMartin, FNP

## 2018-07-17 NOTE — Patient Instructions (Signed)
  Courtney Johnson , Thank you for taking time to come for your Medicare Wellness Visit. I appreciate your ongoing commitment to your health goals. Please review the following plan we discussed and let me know if I can assist you in the future.   These are the goals we discussed: Goals    . Prevent falls     Self - love, take some time to care for self. Travel a little        This is a list of the screening recommended for you and due dates:  Health Maintenance  Topic Date Due  . Colon Cancer Screening  04/26/2018  . Mammogram  10/25/2019  . Tetanus Vaccine  11/19/2020  . Flu Shot  Completed  . DEXA scan (bone density measurement)  Completed  .  Hepatitis C: One time screening is recommended by Center for Disease Control  (CDC) for  adults born from [birth year] through 1964.   Completed  . Pneumonia vaccines  Completed    Keep follow up with Paulene Floor and other specialist Try to work on the "goals" we set today

## 2018-08-10 ENCOUNTER — Encounter: Payer: Self-pay | Admitting: Nurse Practitioner

## 2018-08-10 ENCOUNTER — Ambulatory Visit (INDEPENDENT_AMBULATORY_CARE_PROVIDER_SITE_OTHER): Payer: Medicare Other | Admitting: Nurse Practitioner

## 2018-08-10 VITALS — BP 155/90 | HR 81 | Temp 97.2°F | Ht 63.0 in | Wt 125.0 lb

## 2018-08-10 DIAGNOSIS — R300 Dysuria: Secondary | ICD-10-CM

## 2018-08-10 LAB — URINALYSIS, COMPLETE
Bilirubin, UA: NEGATIVE
Glucose, UA: NEGATIVE
KETONES UA: NEGATIVE
Nitrite, UA: NEGATIVE
Protein, UA: NEGATIVE
RBC, UA: NEGATIVE
Specific Gravity, UA: 1.01 (ref 1.005–1.030)
Urobilinogen, Ur: 0.2 mg/dL (ref 0.2–1.0)
pH, UA: 7 (ref 5.0–7.5)

## 2018-08-10 LAB — MICROSCOPIC EXAMINATION
Bacteria, UA: NONE SEEN
RBC MICROSCOPIC, UA: NONE SEEN /HPF (ref 0–2)
Renal Epithel, UA: NONE SEEN /hpf

## 2018-08-10 MED ORDER — CIPROFLOXACIN HCL 500 MG PO TABS: 500.0000 mg | ORAL_TABLET | Freq: Two times a day (BID) | ORAL | 0 refills | Status: DC

## 2018-08-10 NOTE — Patient Instructions (Signed)

## 2018-08-10 NOTE — Progress Notes (Signed)
   Subjective:    Patient ID: Courtney Johnson, female    DOB: January 25, 1946, 73 y.o.   MRN: [id number]   Chief Complaint: Dysuria   HPI Patient come sin today c/o dysuria . Started about 3-4 days ago. Her husband died end of last week and she has not been able to come in and be seen.   Review of Systems  Constitutional: Negative.   Respiratory: Negative.   Cardiovascular: Negative.   Genitourinary: Positive for dysuria, frequency and urgency.  Neurological: Negative.   Psychiatric/Behavioral: Negative.   All other systems reviewed and are negative.      Objective:   Physical Exam Constitutional:      Appearance: She is normal weight.  Cardiovascular:     Rate and Rhythm: Normal rate and regular rhythm.     Heart sounds: Normal heart sounds.  Pulmonary:     Effort: Pulmonary effort is normal.     Breath sounds: Normal breath sounds.  Abdominal:     General: Abdomen is flat. Bowel sounds are normal.     Palpations: Abdomen is soft.     Tenderness: There is no right CVA tenderness or left CVA tenderness.  Skin:    General: Skin is warm.  Neurological:     General: No focal deficit present.     Mental Status: She is alert and oriented to person, place, and time.  Psychiatric:        Mood and Affect: Mood normal.        Behavior: Behavior normal.   BP (!) 155/90   Pulse 81   Temp (!) 97.2 F (36.2 C) (Oral)   Ht 5\' 3"  (1.6 m)   Wt 125 lb (56.7 kg)   BMI 22.14 kg/m   Urine leuks 1+     Assessment & Plan:  Courtney Johnson in today with chief complaint of Dysuria   1. Dysuria Force fluids Referral to urology - Urinalysis, Complete - ciprofloxacin (CIPRO) 500 MG tablet; Take 1 tablet (500 mg total) by mouth 2 (two) times daily.  Dispense: 10 tablet; Refill: 0 - Ambulatory referral to Urology - Urine Culture  Courtney Daphine Deutscher, FNP

## 2018-08-11 LAB — URINE CULTURE

## 2018-08-16 ENCOUNTER — Encounter: Payer: Self-pay | Admitting: Internal Medicine

## 2018-08-18 ENCOUNTER — Encounter: Payer: Commercial Managed Care - HMO | Admitting: Internal Medicine

## 2018-08-23 ENCOUNTER — Other Ambulatory Visit: Payer: Self-pay

## 2018-08-23 ENCOUNTER — Ambulatory Visit (AMBULATORY_SURGERY_CENTER): Payer: Self-pay

## 2018-08-23 VITALS — Ht 63.0 in | Wt 126.4 lb

## 2018-08-23 DIAGNOSIS — Z800 Family history of malignant neoplasm of digestive organs: Secondary | ICD-10-CM

## 2018-08-23 NOTE — Progress Notes (Signed)
No egg or soy allergy known to patient  No issues with past sedation with any surgeries  or procedures, no intubation problems  No diet pills per patient No home 02 use per patient  No blood thinners per patient  Pt denies issues with constipation  No A fib or A flutter  EMMI video sent to pt's e mail , pt declined    

## 2018-09-05 ENCOUNTER — Telehealth: Payer: Self-pay | Admitting: *Deleted

## 2018-09-05 NOTE — Telephone Encounter (Signed)
Covid-19 travel screening questions  Have you traveled in the last 14 days? No If yes where?  Do you now or have you had a fever in the last 14 days? No  Do you have any respiratory symptoms of shortness of breath or cough now or in the last 14 days? No  Do you have any family members or close contacts with diagnosed or suspected Covid-19? No       

## 2018-09-06 ENCOUNTER — Ambulatory Visit (AMBULATORY_SURGERY_CENTER): Payer: Medicare Other | Admitting: Internal Medicine

## 2018-09-06 ENCOUNTER — Other Ambulatory Visit: Payer: Self-pay

## 2018-09-06 ENCOUNTER — Encounter: Payer: Self-pay | Admitting: Internal Medicine

## 2018-09-06 VITALS — BP 117/66 | HR 64 | Temp 99.1°F | Resp 19 | Ht 63.0 in | Wt 126.0 lb

## 2018-09-06 DIAGNOSIS — Z800 Family history of malignant neoplasm of digestive organs: Secondary | ICD-10-CM

## 2018-09-06 DIAGNOSIS — Z1211 Encounter for screening for malignant neoplasm of colon: Secondary | ICD-10-CM | POA: Diagnosis not present

## 2018-09-06 DIAGNOSIS — Z86010 Personal history of colonic polyps: Secondary | ICD-10-CM | POA: Diagnosis not present

## 2018-09-06 DIAGNOSIS — K219 Gastro-esophageal reflux disease without esophagitis: Secondary | ICD-10-CM | POA: Diagnosis not present

## 2018-09-06 DIAGNOSIS — E7800 Pure hypercholesterolemia, unspecified: Secondary | ICD-10-CM | POA: Diagnosis not present

## 2018-09-06 MED ORDER — SODIUM CHLORIDE 0.9 % IV SOLN: 500.0000 mL | Freq: Once | INTRAVENOUS | Status: DC

## 2018-09-06 NOTE — Progress Notes (Signed)
Report given to PACU, vss 

## 2018-09-06 NOTE — Progress Notes (Signed)
Pt's states no medical or surgical changes since previsit or office visit. 

## 2018-09-06 NOTE — Patient Instructions (Addendum)
No polyps or cancer seen.  I did see the diverticulosis again.  Based upon no polyps ever, your age and guidelines that say stop colonoscopy after 78 I am not recommending a routine repeat in you but if you want to discuss in 5 years we can do that.  As far as the bloating and discomfort I suggest 2 over the counter renmedies:  1. Take Align probiotic x 1 month 2) Try IB Gard as needed for relief 3) If persistent problems check with primary care or me  I appreciate the opportunity to care for you. Iva Boop, MD, [facility]  Discharge instructions given. Handout on Diverticulosis. Resume previous medications. YOU HAD AN ENDOSCOPIC PROCEDURE TODAY AT THE [facility]:   Refer to the procedure report that was given to you for any specific questions about what was found during the examination.  If the procedure report does not answer your questions, please call your gastroenterologist to clarify.  If you requested that your care partner not be given the details of your procedure findings, then the procedure report has been included in a sealed envelope for you to review at your convenience later.  YOU SHOULD EXPECT: Some feelings of bloating in the abdomen. Passage of more gas than usual.  Walking can help get rid of the air that was put into your GI tract during the procedure and reduce the bloating. If you had a lower endoscopy (such as a colonoscopy or flexible sigmoidoscopy) you may notice spotting of blood in your stool or on the toilet paper. If you underwent a bowel prep for your procedure, you may not have a normal bowel movement for a few days.  Please Note:  You might notice some irritation and congestion in your nose or some drainage.  This is from the oxygen used during your procedure.  There is no need for concern and it should clear up in a day or so.  SYMPTOMS TO REPORT IMMEDIATELY:   Following lower endoscopy (colonoscopy or flexible sigmoidoscopy):  Excessive amounts of blood in the stool  Significant tenderness or worsening of abdominal pains  Swelling of the abdomen that is new, acute  Fever of 100F or higher  For urgent or emergent issues, a gastroenterologist can be reached at any hour by calling [phone].   DIET:  We do recommend a small meal at first, but then you may proceed to your regular diet.  Drink plenty of fluids but you should avoid alcoholic beverages for 24 hours.  ACTIVITY:  You should plan to take it easy for the rest of today and you should NOT DRIVE or use heavy machinery until tomorrow (because of the sedation medicines used during the test).    FOLLOW UP: Our staff will call the number listed on your records the next business day following your procedure to check on you and address any questions or concerns that you may have regarding the information given to you following your procedure. If we do not reach you, we will leave a message.  However, if you are feeling well and you are not experiencing any problems, there is no need to return our call.  We will assume that you have returned to your regular daily activities without incident.  If any biopsies were taken you will be contacted by phone or by letter within the next 1-3 weeks.  Please call us at [phone] if you have not heard about the biopsies in 3 weeks.  SIGNATURES/CONFIDENTIALITY: You and/or your care partner have signed paperwork which will be entered into your electronic medical record.  These signatures attest to the fact that that the information above on your After Visit Summary has been reviewed and is understood.  Full responsibility of the confidentiality of this discharge information lies with you and/or your care-partner.

## 2018-09-06 NOTE — Op Note (Signed)
[facility] Patient Name: Courtney Johnson Procedure Date: 09/06/2018 8:53 AM MRN: [id number] Endoscopist: Iva Boop , MD Age: 73 Referring MD:  Date of Birth: Feb 15, 1946 Gender: Female Account #: [id number] Procedure:                Colonoscopy Indications:              Screening in patient at increased risk: Colorectal                            cancer in sister before age 34 Medicines:                Propofol per Anesthesia, Monitored Anesthesia Care Procedure:                Pre-Anesthesia Assessment:                           - Prior to the procedure, a History and Physical                            was performed, and patient medications and                            allergies were reviewed. The patient's tolerance of                            previous anesthesia was also reviewed. The risks                            and benefits of the procedure and the sedation                            options and risks were discussed with the patient.                            All questions were answered, and informed consent                            was obtained. Prior Anticoagulants: The patient has                            taken no previous anticoagulant or antiplatelet                            agents. ASA Grade Assessment: II - A patient with                            mild systemic disease. After reviewing the risks                            and benefits, the patient was deemed in                            satisfactory condition to undergo the procedure.  After obtaining informed consent, the colonoscope                            was passed under direct vision. Throughout the                            procedure, the patient's blood pressure, pulse, and                            oxygen saturations were monitored continuously. The                            Colonoscope was introduced through the anus and   advanced to the the cecum, identified by                            appendiceal orifice and ileocecal valve. The                            colonoscopy was performed without difficulty. The                            patient tolerated the procedure well. The quality                            of the bowel preparation was excellent. The bowel                            preparation used was Miralax. The ileocecal valve,                            appendiceal orifice, and rectum were photographed. Scope In: 8:58:56 AM Scope Out: 9:09:27 AM Scope Withdrawal Time: 0 hours 7 minutes 6 seconds  Total Procedure Duration: 0 hours 10 minutes 31 seconds  Findings:                 The perianal and digital rectal examinations were                            normal.                           Multiple diverticula were found in the sigmoid                            colon. There was narrowing of the colon in                            association with the diverticular opening.                           The exam was otherwise without abnormality on                            direct and retroflexion views. Complications:  No immediate complications. Estimated Blood Loss:     Estimated blood loss: none. Impression:               - Moderate diverticulosis in the sigmoid colon.                            There was narrowing of the colon in association                            with the diverticular opening.                           - The examination was otherwise normal on direct                            and retroflexion views.                           - No specimens collected. Recommendation:           - Patient has a contact number available for                            emergencies. The signs and symptoms of potential                            delayed complications were discussed with the                            patient. Return to normal activities tomorrow.                             Written discharge instructions were provided to the                            patient.                           - Resume previous diet.                           - Continue present medications.                           - No repeat colonoscopy due to age and the absence                            of colonic polyps.                           - Try Align and IB Gard for recent problems with                            bloating and mild lower abdominal discomfort w/o  weight loss or bowel habit changes Iva Boop, MD 09/06/2018 9:17:13 AM This report has been signed electronically.

## 2018-09-07 ENCOUNTER — Telehealth: Payer: Self-pay

## 2018-09-07 NOTE — Telephone Encounter (Signed)
  Follow up Call-  Call back number 09/06/2018  Post procedure Call Back phone  # [phone]  Permission to leave phone message Yes  Some recent data might be hidden     Patient questions:  Do you have a fever, pain , or abdominal swelling? No. Pain Score  0 *  Have you tolerated food without any problems? Yes.    Have you been able to return to your normal activities? Yes.    Do you have any questions about your discharge instructions: Diet   No. Medications  No. Follow up visit  No.  Do you have questions or concerns about your Care? No.  Actions: * If pain score is 4 or above: No action needed, pain <4.

## 2018-09-16 DIAGNOSIS — R300 Dysuria: Secondary | ICD-10-CM | POA: Diagnosis not present

## 2018-09-16 DIAGNOSIS — R109 Unspecified abdominal pain: Secondary | ICD-10-CM | POA: Diagnosis not present

## 2018-10-05 ENCOUNTER — Other Ambulatory Visit: Payer: Self-pay | Admitting: Nurse Practitioner

## 2018-10-05 DIAGNOSIS — M810 Age-related osteoporosis without current pathological fracture: Secondary | ICD-10-CM

## 2018-10-06 DIAGNOSIS — R300 Dysuria: Secondary | ICD-10-CM | POA: Diagnosis not present

## 2018-10-06 DIAGNOSIS — R350 Frequency of micturition: Secondary | ICD-10-CM | POA: Diagnosis not present

## 2018-10-07 ENCOUNTER — Other Ambulatory Visit: Payer: Self-pay | Admitting: Nurse Practitioner

## 2018-10-07 DIAGNOSIS — M810 Age-related osteoporosis without current pathological fracture: Secondary | ICD-10-CM

## 2018-10-11 ENCOUNTER — Other Ambulatory Visit: Payer: Self-pay | Admitting: Family Medicine

## 2018-10-11 DIAGNOSIS — M810 Age-related osteoporosis without current pathological fracture: Secondary | ICD-10-CM

## 2018-10-11 MED ORDER — ALENDRONATE SODIUM 70 MG PO TABS: ORAL_TABLET | ORAL | 0 refills | Status: DC

## 2018-10-16 DIAGNOSIS — N952 Postmenopausal atrophic vaginitis: Secondary | ICD-10-CM | POA: Diagnosis not present

## 2018-10-16 DIAGNOSIS — N390 Urinary tract infection, site not specified: Secondary | ICD-10-CM | POA: Diagnosis not present

## 2018-10-19 ENCOUNTER — Other Ambulatory Visit: Payer: Self-pay

## 2018-10-19 ENCOUNTER — Ambulatory Visit (INDEPENDENT_AMBULATORY_CARE_PROVIDER_SITE_OTHER): Payer: Medicare Other | Admitting: Nurse Practitioner

## 2018-10-19 ENCOUNTER — Encounter: Payer: Self-pay | Admitting: Nurse Practitioner

## 2018-10-19 DIAGNOSIS — R1013 Epigastric pain: Secondary | ICD-10-CM | POA: Diagnosis not present

## 2018-10-19 DIAGNOSIS — K219 Gastro-esophageal reflux disease without esophagitis: Secondary | ICD-10-CM | POA: Diagnosis not present

## 2018-10-19 MED ORDER — PANTOPRAZOLE SODIUM 40 MG PO TBEC: 40.0000 mg | DELAYED_RELEASE_TABLET | Freq: Every day | ORAL | 3 refills | Status: DC

## 2018-10-19 NOTE — Progress Notes (Signed)
Patient ID: Courtney Johnson, female   DOB: 03-23-1946, 73 y.o.   MRN: [id number]     Virtual Visit via telephone Note  I connected with Courtney Johnson on 10/19/18 at 9:05 AM by telephone and verified that I am speaking with the correct person using two identifiers. KEALY SNOPEK is currently located at home and no one is currently with her during visit. The provider, [name], FNP is located in their office at time of visit.  I discussed the limitations, risks, security and privacy concerns of performing an evaluation and management service by telephone and the availability of in person appointments. I also discussed with the patient that there may be Johnson patient responsible charge related to this service. The patient expressed understanding and agreed to proceed.   History and Present Illness:   Chief Complaint: Abdominal Pain   HPI Patient calls in c/o  Abdominal pain. The pain occurs daily. It starts as bad heartburn then it starts hurting in gastric area then she swells with gas . Slight nausea.  Eating helps sometimes. Having Johnson bowel movement does not help with pain. Rates pain 8/10. nothing makes it better. Omeprazole not helping at all. She would like to have referral back to GI. SHe had colonoscopy in March and was normal.     Review of Systems  Constitutional: Negative.   HENT: Negative.   Cardiovascular: Negative.   Gastrointestinal: Positive for abdominal pain, heartburn and nausea.  Genitourinary: Negative.   Musculoskeletal: Negative.   Neurological: Negative.   Psychiatric/Behavioral: Negative.   All other systems reviewed and are negative.    Observations/Objective: Alert and oriented No distress noted Patient seems anxious.  Assessment and Plan: Courtney Johnson in today with chief complaint of Abdominal Pain   1. Gastroesophageal reflux disease, esophagitis presence not specified Avoid spicy foods Do not eat 2 hours prior to bedtime   2. Epigastric pain Keep diary of pain.    Orders Placed This Encounter  Procedures  . Ambulatory referral to Gastroenterology    Referral Priority:   Routine    Referral Type:   Consultation    Referral Reason:   Specialty Services Required    Referred to Provider:   Iva Boop, MD    Number of Visits Requested:   1   Meds ordered this encounter  Medications  . pantoprazole (PROTONIX) 40 MG tablet    Sig: Take 1 tablet (40 mg total) by mouth daily.    Dispense:  30 tablet    Refill:  3    Order Specific Question:   Supervising Provider    Answer:   Courtney Johnson [[id number]]        Follow Up Instructions: prn    I discussed the assessment and treatment plan with the patient. The patient was provided an opportunity to ask questions and all were answered. The patient agreed with the plan and demonstrated an understanding of the instructions.   The patient was advised to call back or seek an in-person evaluation if the symptoms worsen or if the condition fails to improve as anticipated.  The above assessment and management plan was discussed with the patient. The patient verbalized understanding of and has agreed to the management plan. Patient is aware to call the clinic if symptoms persist or worsen. Patient is aware when to return to the clinic for Johnson follow-up visit. Patient educated on when it is appropriate to go to the emergency department.    I  provided 15 minutes of non-face-to-face time during this encounter.    [name], FNP

## 2018-10-24 ENCOUNTER — Other Ambulatory Visit: Payer: Self-pay

## 2018-10-24 ENCOUNTER — Encounter: Payer: Self-pay | Admitting: Internal Medicine

## 2018-10-24 ENCOUNTER — Ambulatory Visit (INDEPENDENT_AMBULATORY_CARE_PROVIDER_SITE_OTHER): Payer: Medicare Other | Admitting: Internal Medicine

## 2018-10-24 VITALS — Ht 63.0 in | Wt 122.0 lb

## 2018-10-24 DIAGNOSIS — R109 Unspecified abdominal pain: Secondary | ICD-10-CM

## 2018-10-24 DIAGNOSIS — R350 Frequency of micturition: Secondary | ICD-10-CM

## 2018-10-24 DIAGNOSIS — K219 Gastro-esophageal reflux disease without esophagitis: Secondary | ICD-10-CM

## 2018-10-24 DIAGNOSIS — R140 Abdominal distension (gaseous): Secondary | ICD-10-CM | POA: Diagnosis not present

## 2018-10-24 DIAGNOSIS — K6289 Other specified diseases of anus and rectum: Secondary | ICD-10-CM | POA: Diagnosis not present

## 2018-10-24 MED ORDER — HYDROCORTISONE 1 % EX CREA: 1.0000 "application " | TOPICAL_CREAM | Freq: Two times a day (BID) | CUTANEOUS | 1 refills | Status: DC | PRN

## 2018-10-24 NOTE — Assessment & Plan Note (Signed)
Trial of vaginal estrogen pre Dr. Mena Goes Looks like Ninfa Linden was rxed but do not see that it went through? Has f/u with urology PA

## 2018-10-24 NOTE — Assessment & Plan Note (Signed)
Since it seems to improve with Preparation H and she has protrusions it may be hemorrhoidal in origin.  She just had a colonoscopy, I do not think we need to rush to do an in office examination but I will follow-up on this when we are able to.  In the meantime I have prescribed some hydrocortisone cream to be used as needed.

## 2018-10-24 NOTE — Progress Notes (Signed)
TELEHEALTH ENCOUNTER IN SETTING OF COVID-19 PANDEMIC - REQUESTED BY PATIENT SERVICE PROVIDED BY TELEMEDECINE - TYPE: Telephone, A/V not possible PATIENT LOCATION: Home PATIENT HAS CONSENTED TO TELEHEALTH VISIT PROVIDER LOCATION: OFFICE REFERRING PROVIDER: Bennie Pierini, FNP  PARTICIPANTS OTHER THAN PATIENT: None TIME SPENT ON CALL: 25 minutes    Courtney Johnson 72 y.o. 03/26/1946 [id number]  Assessment & Plan:   GERD (gastroesophageal reflux disease) This certainly seems to be worse, question any relationship to Fosamax, question situational stressors with husband's death but agree with increasing PPI from a 20 mg as omeprazole to 40 mg pantoprazole.  She needs to give that a chance to work.  Will reinforce with diet recommendations though I think that is not a major issue for her I will send instructions.  She does not meet criteria for an EGD at this point that I think.  Rectal discomfort Since it seems to improve with Preparation H and she has protrusions it may be hemorrhoidal in origin.  She just had a colonoscopy, I do not think we need to rush to do an in office examination but I will follow-up on this when we are able to.  In the meantime I have prescribed some hydrocortisone cream to be used as needed.  Abdominal bloating with cramps I wonder if situational stressors and grief are contributing to this though with multiple antibiotics used for UTIs in the last year perhaps she has some disruption of the gut flora.  Align with supper is recommended.    We will arrange for follow-up sometime in June.  Sooner if needed.  GG:[id number], Mary-Margaret, FNP Cc: Dr. Karma Greaser  Subjective:   Chief Complaint: Heartburn, rectal ache borborygmi and bloating  HPI The patient is having a medical visit through telehealth, phone as AV not available, because of several issues including heartburn bloating borborygmi and rectal ache.  Her heartburn is been getting worse,  she just had a visit with Bennie Pierini, NP, and was prescribed a higher dose of PPI moving from 20 mg over-the-counter Nexium to 40 mg pantoprazole.  She just picked it up but has not started it.  She said her heartburn is worse.  She has had it for a long time but things just seem to be getting worse and she is not really changed her diet or anything like that.  No dysphagia no unintentional weight loss.  She does take Fosamax on a regular basis but has done so for some time.  She has never had an upper endoscopy.  Another issue is a rectal ache and a protrusion that she thinks is a hemorrhoid.  In March she had a routine screening colonoscopy because of a family history of colon cancer that was negative.  I did not comment on inflamed hemorrhoids at that time.  She will use some Preparation H and will help.  Another issue is she wakens in the morning with lots of borborygmi and abdominal bloating and discomfort.  She has to hold her abdomen and when she gets up and has a glass of water and starts moving around things get better.  Her symptoms bother her less when she is up and around and moving and active.  Social history notable for that her husband died from lung cancer 1.5 months ago.  She has also been to see Dr. Mena Goes due to recurrent UTI symptoms, he has prescribed a topical estrogen because of the possibility of vaginal atrophy.  She has not really started that  yet.  She has a lot of burning with urination etc. and has had some positive cultures some not.  She has been on multiple antibiotics in the past year because of this.  She is concerned and frustrated and though she realizes none of these problems may be extremely serious she says "I just have to get the feeling better and understand were all this is coming from".  Wt Readings from Last 3 Encounters:  10/24/18 122 lb (55.3 kg)  09/06/18 126 lb (57.2 kg)  08/23/18 126 lb 6.4 oz (57.3 kg)    No Known Allergies Current Meds   Medication Sig  . alendronate (FOSAMAX) 70 MG tablet TAKE 1 TABLET BY MOUTH ONCE A WEEK ON AN EMPTY STOMACH WITH  A  FULL  GLASS  OF  WATER  . ALPRAZolam (XANAX) 0.25 MG tablet Take 1 tablet (0.25 mg total) by mouth 2 (two) times daily as needed for sleep.  . Cholecalciferol (VITAMIN D) 50 MCG (2000 UT) tablet Take 2,000 Units by mouth daily.  . pantoprazole (PROTONIX) 40 MG tablet Take 1 tablet (40 mg total) by mouth daily.  . simvastatin (ZOCOR) 40 MG tablet Take 1 tablet (40 mg total) by mouth daily.  . [DISCONTINUED] omeprazole (PRILOSEC) 20 MG capsule Take 20 mg by mouth daily. OTC   Past Medical History:  Diagnosis Date  . Allergy   . Anxiety    not much sleep / stress   . Arthritis   . Cataract   . GERD (gastroesophageal reflux disease)   . Hyperlipidemia   . Hypertension    elevated at [facility] = not high at home   . Osteoporosis    Past Surgical History:  Procedure Laterality Date  . ABDOMINAL HYSTERECTOMY     partial   . COLONOSCOPY    . EYE SURGERY     cataracts removed - bilateral    Social History   Social History Narrative   Widowed 2020   3 grown children    Realtor   No alcohol never smoker never drug user   family history includes Cancer in her sister; Colon cancer in her sister; Diabetes in her daughter; Heart attack in her brother and son; Heart disease in her brother and father; Other in her mother.   Review of Systems As per HPI

## 2018-10-24 NOTE — Assessment & Plan Note (Signed)
I wonder if situational stressors and grief are contributing to this though with multiple antibiotics used for UTIs in the last year perhaps she has some disruption of the gut flora.  Align with supper is recommended.

## 2018-10-24 NOTE — Patient Instructions (Addendum)
Sorry you are not feeling well but I think you will feel better.  Take the pantoprazole and estradiol that Mary-Margaret and Dr. Mena Goes have prescribed.  Purchase Align probiotic and take 1 each night with supper.  Pick up the hydrocortisone cream and use for the hemorrhoids as needed.  Try calling us in second half of May (After May 15) to get a mid or late June follow-up with me - need to give the treatments a chance to work.   I appreciate the opportunity to care for you. Iva Boop, MD, [facility]

## 2018-10-24 NOTE — Assessment & Plan Note (Signed)
This certainly seems to be worse, question any relationship to Fosamax, question situational stressors with husband's death but agree with increasing PPI from a 20 mg as omeprazole to 40 mg pantoprazole.  She needs to give that a chance to work.  Will reinforce with diet recommendations though I think that is not a major issue for her I will send instructions.  She does not meet criteria for an EGD at this point that I think.

## 2018-11-06 ENCOUNTER — Other Ambulatory Visit: Payer: Self-pay | Admitting: Family Medicine

## 2018-11-06 DIAGNOSIS — M810 Age-related osteoporosis without current pathological fracture: Secondary | ICD-10-CM

## 2018-11-20 DIAGNOSIS — R300 Dysuria: Secondary | ICD-10-CM | POA: Diagnosis not present

## 2018-11-20 DIAGNOSIS — N390 Urinary tract infection, site not specified: Secondary | ICD-10-CM | POA: Diagnosis not present

## 2018-11-20 DIAGNOSIS — N952 Postmenopausal atrophic vaginitis: Secondary | ICD-10-CM | POA: Diagnosis not present

## 2018-12-18 DIAGNOSIS — H52221 Regular astigmatism, right eye: Secondary | ICD-10-CM | POA: Diagnosis not present

## 2018-12-18 DIAGNOSIS — H04123 Dry eye syndrome of bilateral lacrimal glands: Secondary | ICD-10-CM | POA: Diagnosis not present

## 2018-12-18 DIAGNOSIS — H16223 Keratoconjunctivitis sicca, not specified as Sjogren's, bilateral: Secondary | ICD-10-CM | POA: Diagnosis not present

## 2018-12-18 DIAGNOSIS — H5201 Hypermetropia, right eye: Secondary | ICD-10-CM | POA: Diagnosis not present

## 2018-12-26 ENCOUNTER — Ambulatory Visit: Payer: Medicare Other | Admitting: Nurse Practitioner

## 2019-01-01 DIAGNOSIS — H04123 Dry eye syndrome of bilateral lacrimal glands: Secondary | ICD-10-CM | POA: Diagnosis not present

## 2019-01-01 DIAGNOSIS — H1712 Central corneal opacity, left eye: Secondary | ICD-10-CM | POA: Diagnosis not present

## 2019-01-01 DIAGNOSIS — H1045 Other chronic allergic conjunctivitis: Secondary | ICD-10-CM | POA: Diagnosis not present

## 2019-01-01 DIAGNOSIS — H16223 Keratoconjunctivitis sicca, not specified as Sjogren's, bilateral: Secondary | ICD-10-CM | POA: Diagnosis not present

## 2019-01-01 DIAGNOSIS — H1851 Endothelial corneal dystrophy: Secondary | ICD-10-CM | POA: Diagnosis not present

## 2019-01-05 ENCOUNTER — Other Ambulatory Visit: Payer: Self-pay | Admitting: Nurse Practitioner

## 2019-01-05 DIAGNOSIS — E785 Hyperlipidemia, unspecified: Secondary | ICD-10-CM

## 2019-01-12 ENCOUNTER — Other Ambulatory Visit: Payer: Self-pay

## 2019-01-15 ENCOUNTER — Other Ambulatory Visit: Payer: Self-pay

## 2019-01-15 ENCOUNTER — Ambulatory Visit (INDEPENDENT_AMBULATORY_CARE_PROVIDER_SITE_OTHER): Payer: Medicare Other | Admitting: Nurse Practitioner

## 2019-01-15 ENCOUNTER — Encounter: Payer: Self-pay | Admitting: Nurse Practitioner

## 2019-01-15 VITALS — BP 144/85 | HR 83 | Temp 97.3°F | Ht 63.0 in | Wt 124.0 lb

## 2019-01-15 DIAGNOSIS — M810 Age-related osteoporosis without current pathological fracture: Secondary | ICD-10-CM | POA: Insufficient documentation

## 2019-01-15 DIAGNOSIS — K219 Gastro-esophageal reflux disease without esophagitis: Secondary | ICD-10-CM

## 2019-01-15 DIAGNOSIS — E785 Hyperlipidemia, unspecified: Secondary | ICD-10-CM | POA: Diagnosis not present

## 2019-01-15 DIAGNOSIS — F411 Generalized anxiety disorder: Secondary | ICD-10-CM

## 2019-01-15 DIAGNOSIS — Z1231 Encounter for screening mammogram for malignant neoplasm of breast: Secondary | ICD-10-CM | POA: Diagnosis not present

## 2019-01-15 MED ORDER — SIMVASTATIN 40 MG PO TABS: 40.0000 mg | ORAL_TABLET | Freq: Every day | ORAL | 1 refills | Status: DC

## 2019-01-15 MED ORDER — ALPRAZOLAM 0.25 MG PO TABS: 0.2500 mg | ORAL_TABLET | Freq: Two times a day (BID) | ORAL | 2 refills | Status: DC | PRN

## 2019-01-15 MED ORDER — ALENDRONATE SODIUM 70 MG PO TABS: ORAL_TABLET | ORAL | 1 refills | Status: DC

## 2019-01-15 NOTE — Patient Instructions (Signed)

## 2019-01-15 NOTE — Progress Notes (Signed)
Subjective:    Patient ID: Courtney Johnson, female    DOB: [birth year], 73 y.o.   MRN: [id number]   Chief Complaint: Medical Management of Chronic Issues    HPI:  1. Hyperlipidemia, unspecified hyperlipidemia type Does not watch diet and does very little exercise  2. Gastroesophageal reflux disease, esophagitis presence not specified Is on no rx meds and uses OTC meds when needed. Does not have symptoms very often  3. Osteopenia of lumbar spine Last dexascan was done 12/05/17 with t score of -2.7. she does no weight bearing exercise  4. Generalized anxiety disorder Is on xanax 0.25 bid. She has been more anxious since all of this corona virus started.    Outpatient Encounter Medications as of 01/15/2019  Medication Sig  . alendronate (FOSAMAX) 70 MG tablet TAKE 1 TABLET BY MOUTH ONCE A WEEK ON AN EMPTY STOMACH WITH A FULL GLASS OF WATER DO NOT LAY DOWN FOR 30 MINUTES  . ALPRAZolam (XANAX) 0.25 MG tablet Take 1 tablet (0.25 mg total) by mouth 2 (two) times daily as needed for sleep.  . Cholecalciferol (VITAMIN D) 50 MCG (2000 UT) tablet Take 2,000 Units by mouth daily.  Marland Kitchen estradiol (ESTRACE VAGINAL) 0.1 MG/GM vaginal cream Place 1 Applicatorful vaginally at bedtime.  . simvastatin (ZOCOR) 40 MG tablet Take 1 tablet by mouth once daily     Past Surgical History:  Procedure Laterality Date  . ABDOMINAL HYSTERECTOMY     partial   . COLONOSCOPY    . EYE SURGERY     cataracts removed - bilateral     Family History  Problem Relation Age of Onset  . Other Mother        age and frailty   . Heart disease Father        by-pass  . Heart attack Brother        36   . Heart disease Brother   . Diabetes Daughter   . Colon cancer Sister   . Cancer Sister        brain tumor - 15  . Heart attack Son        at age 13  . Esophageal cancer Neg Hx   . Rectal cancer Neg Hx   . Stomach cancer Neg Hx     New complaints: None  today  Social history: lives alone. femilay  checks  on her daily  Controlled substance contract: 01/15/19    Review of Systems  Constitutional: Negative for activity change and appetite change.  HENT: Negative.   Eyes: Negative for pain.  Respiratory: Negative for shortness of breath.   Cardiovascular: Negative for chest pain, palpitations and leg swelling.  Gastrointestinal: Negative for abdominal pain.  Endocrine: Negative for polydipsia.  Genitourinary: Negative.   Skin: Negative for rash.  Neurological: Negative for dizziness, weakness and headaches.  Hematological: Does not bruise/bleed easily.  Psychiatric/Behavioral: Negative.   All other systems reviewed and are negative.      Objective:   Physical Exam Vitals signs and nursing note reviewed.  Constitutional:      General: She is not in acute distress.    Appearance: Normal appearance. She is well-developed.  HENT:     Head: Normocephalic.     Nose: Nose normal.  Eyes:     Pupils: Pupils are equal, round, and reactive to light.  Neck:     Musculoskeletal: Normal range of motion and neck supple.     Vascular: No carotid bruit or JVD.  Cardiovascular:  Rate and Rhythm: Normal rate and regular rhythm.     Heart sounds: Normal heart sounds.  Pulmonary:     Effort: Pulmonary effort is normal. No respiratory distress.     Breath sounds: Normal breath sounds. No wheezing or rales.  Chest:     Chest wall: No tenderness.  Abdominal:     General: Bowel sounds are normal. There is no distension or abdominal bruit.     Palpations: Abdomen is soft. There is no hepatomegaly, splenomegaly, mass or pulsatile mass.     Tenderness: There is no abdominal tenderness.  Musculoskeletal: Normal range of motion.  Lymphadenopathy:     Cervical: No cervical adenopathy.  Skin:    General: Skin is warm and dry.  Neurological:     Mental Status: She is alert and oriented to person, place, and time.     Deep Tendon Reflexes: Reflexes are normal and symmetric.  Psychiatric:         Behavior: Behavior normal.        Thought Content: Thought content normal.        Judgment: Judgment normal.     BP (!) 144/85   Pulse 83   Temp (!) 97.3 F (36.3 C) (Oral)   Ht 5' 3"  (1.6 m)   Wt 124 lb (56.2 kg)   BMI 21.97 kg/m        Assessment & Plan:  HENDY BRINDLE comes in today with chief complaint of Medical Management of Chronic Issues   Diagnosis and orders addressed:  1. Hyperlipidemia, unspecified hyperlipidemia type Low fat diet - CMP14+EGFR - Lipid panel - simvastatin (ZOCOR) 40 MG tablet; Take 1 tablet (40 mg total) by mouth daily.  Dispense: 90 tablet; Refill: 1  2. Gastroesophageal reflux disease, esophagitis presence not specified Avoid spicy foods Do not eat 2 hours prior to bedtime   3. Age-related osteoporosis without current pathological fracture Weight bearing exercise when can tolerate - alendronate (FOSAMAX) 70 MG tablet; TAKE 1 TABLET BY MOUTH ONCE A WEEK ON AN EMPTY STOMACH WITH A FULL GLASS OF WATER DO NOT LAY DOWN FOR 30 MINUTES  Dispense: 12 tablet; Refill: 1  4. GAD (generalized anxiety disorder) Stress management - ALPRAZolam (XANAX) 0.25 MG tablet; Take 1 tablet (0.25 mg total) by mouth 2 (two) times daily as needed for sleep.  Dispense: 60 tablet; Refill: 2   Labs pending Health Maintenance reviewed Diet and exercise encouraged  Follow up plan: 6 months   Mary-Margaret Hassell Done, FNP

## 2019-01-16 LAB — CMP14+EGFR
ALT: 24 IU/L (ref 0–32)
AST: 30 IU/L (ref 0–40)
Albumin/Globulin Ratio: 2.6 — ABNORMAL HIGH (ref 1.2–2.2)
Albumin: 4.7 g/dL (ref 3.7–4.7)
Alkaline Phosphatase: 52 IU/L (ref 39–117)
BUN/Creatinine Ratio: 11 — ABNORMAL LOW (ref 12–28)
BUN: 9 mg/dL (ref 8–27)
Bilirubin Total: 0.3 mg/dL (ref 0.0–1.2)
CO2: 24 mmol/L (ref 20–29)
Calcium: 9.2 mg/dL (ref 8.7–10.3)
Chloride: 105 mmol/L (ref 96–106)
Creatinine, Ser: 0.83 mg/dL (ref 0.57–1.00)
GFR calc Af Amer: 82 mL/min/{1.73_m2} (ref >59)
GFR calc non Af Amer: 71 mL/min/{1.73_m2} (ref >59)
Globulin, Total: 1.8 g/dL (ref 1.5–4.5)
Glucose: 84 mg/dL (ref 65–99)
Potassium: 3.9 mmol/L (ref 3.5–5.2)
Sodium: 145 mmol/L — ABNORMAL HIGH (ref 134–144)
Total Protein: 6.5 g/dL (ref 6.0–8.5)

## 2019-01-16 LAB — LIPID PANEL
Chol/HDL Ratio: 2.5 ratio (ref 0.0–4.4)
Cholesterol, Total: 154 mg/dL (ref 100–199)
HDL: 62 mg/dL (ref >39)
LDL Calculated: 66 mg/dL (ref 0–99)
Triglycerides: 129 mg/dL (ref 0–149)
VLDL Cholesterol Cal: 26 mg/dL (ref 5–40)

## 2019-03-16 ENCOUNTER — Other Ambulatory Visit: Payer: Self-pay

## 2019-03-16 ENCOUNTER — Ambulatory Visit (INDEPENDENT_AMBULATORY_CARE_PROVIDER_SITE_OTHER): Payer: Medicare Other | Admitting: Nurse Practitioner

## 2019-03-16 ENCOUNTER — Encounter: Payer: Self-pay | Admitting: Nurse Practitioner

## 2019-03-16 DIAGNOSIS — N390 Urinary tract infection, site not specified: Secondary | ICD-10-CM

## 2019-03-16 MED ORDER — CIPROFLOXACIN HCL 500 MG PO TABS: 500.0000 mg | ORAL_TABLET | Freq: Two times a day (BID) | ORAL | 0 refills | Status: DC

## 2019-03-16 NOTE — Progress Notes (Signed)
   Virtual Visit via telephone Note Due to COVID-19 pandemic this visit was conducted virtually. This visit type was conducted due to national recommendations for restrictions regarding the COVID-19 Pandemic (e.g. social distancing, sheltering in place) in an effort to limit this patient's exposure and mitigate transmission in our community. All issues noted in this document were discussed and addressed.  A physical exam was not performed with this format.  I connected with Courtney Johnson on 03/16/19 at 11:10 by telephone and verified that I am speaking with the correct person using two identifiers. Courtney Johnson is currently located at home  and no one is currently with her during visit. The provider, Mary-Margaret Hassell Done, FNP is located in their office at time of visit.  I discussed the limitations, risks, security and privacy concerns of performing an evaluation and management service by telephone and the availability of in person appointments. I also discussed with the patient that there may be a patient responsible charge related to this service. The patient expressed understanding and agreed to proceed.   History and Present Illness:  PATIENT CALLS IN C/O OF RECURRENT uti- STARTED HAVING DYSURIA AND FREQUECY. SHE HAS SEEN UROLOGY FOR FREQUENT uti anD THEY HAVE DONE NOTHING   Review of Systems  Constitutional: Negative for diaphoresis and weight loss.  Eyes: Negative for blurred vision, double vision and pain.  Respiratory: Negative for shortness of breath.   Cardiovascular: Negative for chest pain, palpitations, orthopnea and leg swelling.  Gastrointestinal: Negative for abdominal pain.  Genitourinary: Positive for dysuria, frequency and urgency.  Skin: Negative for rash.  Neurological: Negative for dizziness, sensory change, loss of consciousness, weakness and headaches.  Endo/Heme/Allergies: Negative for polydipsia. Does not bruise/bleed easily.  Psychiatric/Behavioral:  Negative for memory loss. The patient does not have insomnia.   All other systems reviewed and are negative.    Observations/Objective: Alert and oriented- answers all questions appropriately No distress    Assessment and Plan: Courtney Johnson in today with chief complaint of Urinary Tract Infection   1. Recurrent UTI Take medication as prescribe Cotton underwear Take shower not bath Cranberry juice, yogurt Force fluids AZO over the counter X2 days Try cranberry tablets OTC RTO prn  - ciprofloxacin (CIPRO) 500 MG tablet; Take 1 tablet (500 mg total) by mouth 2 (two) times daily.  Dispense: 10 tablet; Refill: 0   Follow Up Instructions: prn    I discussed the assessment and treatment plan with the patient. The patient was provided an opportunity to ask questions and all were answered. The patient agreed with the plan and demonstrated an understanding of the instructions.   The patient was advised to call back or seek an in-person evaluation if the symptoms worsen or if the condition fails to improve as anticipated.  The above assessment and management plan was discussed with the patient. The patient verbalized understanding of and has agreed to the management plan. Patient is aware to call the clinic if symptoms persist or worsen. Patient is aware when to return to the clinic for a follow-up visit. Patient educated on when it is appropriate to go to the emergency department.   Time call ended:  11:21 I provided 11 minutes of non-face-to-face time during this encounter.    Mary-Margaret Hassell Done, FNP

## 2019-03-22 ENCOUNTER — Telehealth: Payer: Self-pay | Admitting: Nurse Practitioner

## 2019-03-22 NOTE — Telephone Encounter (Signed)
Needs to bring urine in so we can do urinalysis and  send for culture

## 2019-03-23 NOTE — Telephone Encounter (Signed)
Multiple attempts made to contact patient this encounter will now be closed.  

## 2019-04-07 ENCOUNTER — Other Ambulatory Visit (INDEPENDENT_AMBULATORY_CARE_PROVIDER_SITE_OTHER): Payer: Medicare Other | Admitting: Family

## 2019-04-07 DIAGNOSIS — R399 Unspecified symptoms and signs involving the genitourinary system: Secondary | ICD-10-CM | POA: Diagnosis not present

## 2019-04-07 MED ORDER — CEPHALEXIN 500 MG PO CAPS: 500.0000 mg | ORAL_CAPSULE | Freq: Two times a day (BID) | ORAL | 0 refills | Status: DC

## 2019-04-07 NOTE — Progress Notes (Signed)
   Virtual Visit via telephone Note Due to COVID-19 pandemic this visit was conducted virtually. This visit type was conducted due to national recommendations for restrictions regarding the COVID-19 Pandemic (e.g. social distancing, sheltering in place) in an effort to limit this patient's exposure and mitigate transmission in our community. All issues noted in this document were discussed and addressed.  A physical exam was not performed with this format.  I connected with [name] on 04/07/19 at 8:05Am by telephone and verified that I am speaking with the correct person using two identifiers. Courtney Johnson is currently located at home and no one is currently with her during visit. The provider, Evelina Dun, FNP is located in their office at time of visit.  I discussed the limitations, risks, security and privacy concerns of performing an evaluation and management service by telephone and the availability of in person appointments. I also discussed with the patient that there may be a patient responsible charge related to this service. The patient expressed understanding and agreed to proceed.   History and Present Illness: Patient called the office today with recurrent UTI symptoms. She had a visit last week and was prescribed Cipro. She reports after completing the medication her symptoms returned. Dysuria  This is a recurrent problem. The current episode started 1 to 4 weeks ago. The problem occurs intermittently. The problem has been waxing and waning. The pain is at a severity of 4/10. The pain is mild. Associated symptoms include frequency, hesitancy and nausea. She has tried antibiotics for the symptoms. The treatment provided mild relief. Her past medical history is significant for recurrent UTIs.      Review of Systems  Gastrointestinal: Positive for nausea.  Genitourinary: Positive for dysuria, frequency and hesitancy.     Observations/Objective: No SOB or distress  joted  Assessment and Plan: UTI symptoms - Plan: cephALEXin (KEFLEX) 500 MG capsule Discussed since this is a recurrent UTI she needs to have a urine culture done. It is Saturday and she does not want go to urgent care. She states if her symptoms return or if not better by Monday she will make an appointment to be seen.     I discussed the assessment and treatment plan with the patient. The patient was provided an opportunity to ask questions and all were answered. The patient agreed with the plan and demonstrated an understanding of the instructions.   The patient was advised to call back or seek an in-person evaluation if the symptoms worsen or if the condition fails to improve as anticipated.  The above assessment and management plan was discussed with the patient. The patient verbalized understanding of and has agreed to the management plan. Patient is aware to call the clinic if symptoms persist or worsen. Patient is aware when to return to the clinic for a follow-up visit. Patient educated on when it is appropriate to go to the emergency department.   Time call ended:  8:10am  I provided 5 minutes of non-face-to-face time during this encounter.    Evelina Dun, FNP

## 2019-04-19 ENCOUNTER — Other Ambulatory Visit: Payer: Self-pay

## 2019-04-19 ENCOUNTER — Ambulatory Visit (INDEPENDENT_AMBULATORY_CARE_PROVIDER_SITE_OTHER): Payer: Medicare Other | Admitting: Gastroenterology

## 2019-04-19 ENCOUNTER — Encounter: Payer: Self-pay | Admitting: Gastroenterology

## 2019-04-19 VITALS — BP 124/70 | HR 110 | Temp 98.4°F | Ht 63.0 in | Wt 126.0 lb

## 2019-04-19 DIAGNOSIS — Z1159 Encounter for screening for other viral diseases: Secondary | ICD-10-CM | POA: Insufficient documentation

## 2019-04-19 DIAGNOSIS — R1013 Epigastric pain: Secondary | ICD-10-CM | POA: Insufficient documentation

## 2019-04-19 DIAGNOSIS — K219 Gastro-esophageal reflux disease without esophagitis: Secondary | ICD-10-CM | POA: Diagnosis not present

## 2019-04-19 NOTE — Progress Notes (Signed)
04/19/2019 Courtney Johnson [id number] Sep 15, 1945   HISTORY OF PRESENT ILLNESS:  This is a pleasant 73 year old female who is a patient of Dr. Marvell Fuller.  She is here today with complaints of reflux.  She was seen by him via a Telehealth visit back in May for the same complaints.  At that time she was only taking omeprazole 20 mg daily so that was changed to pantoprazole 40 mg daily.  She says that she tried it for a little while, but did not seem to make any difference so she went back to the omeprazole 20 mg daily.  She reports that she always keeps heartburn/reflux/indigestion.  She wants to know what is going on and would like an endoscopy.   Past Medical History:  Diagnosis Date  . Allergy   . Anxiety    not much sleep / stress   . Arthritis   . Cataract   . GERD (gastroesophageal reflux disease)   . Hyperlipidemia   . Hypertension    elevated at Dr Isidore Moos = not high at home   . Osteoporosis    Past Surgical History:  Procedure Laterality Date  . ABDOMINAL HYSTERECTOMY     partial   . COLONOSCOPY    . EYE SURGERY     cataracts removed - bilateral     reports that she has never smoked. She has never used smokeless tobacco. She reports that she does not drink alcohol or use drugs. family history includes Cancer in her sister; Colon cancer in her sister; Diabetes in her daughter; Heart attack in her brother and son; Heart disease in her brother and father; Other in her mother. No Known Allergies    Outpatient Encounter Medications as of 04/19/2019  Medication Sig  . alendronate (FOSAMAX) 70 MG tablet TAKE 1 TABLET BY MOUTH ONCE A WEEK ON AN EMPTY STOMACH WITH A FULL GLASS OF WATER DO NOT LAY DOWN FOR 30 MINUTES  . ALPRAZolam (XANAX) 0.25 MG tablet Take 1 tablet (0.25 mg total) by mouth 2 (two) times daily as needed for sleep.  . Cholecalciferol (VITAMIN D) 50 MCG (2000 UT) tablet Take 2,000 Units by mouth daily.  Marland Kitchen omeprazole (PRILOSEC) 20 MG capsule Take 1 capsule  by mouth daily before breakfast.  . simvastatin (ZOCOR) 40 MG tablet Take 1 tablet (40 mg total) by mouth daily.  . [DISCONTINUED] cephALEXin (KEFLEX) 500 MG capsule Take 1 capsule (500 mg total) by mouth 2 (two) times daily.  . [DISCONTINUED] ciprofloxacin (CIPRO) 500 MG tablet Take 1 tablet (500 mg total) by mouth 2 (two) times daily.  . [DISCONTINUED] estradiol (ESTRACE VAGINAL) 0.1 MG/GM vaginal cream Place 1 Applicatorful vaginally at bedtime.   No facility-administered encounter medications on file as of 04/19/2019.      REVIEW OF SYSTEMS  : All other systems reviewed and negative except where noted in the History of Present Illness.   PHYSICAL EXAM: BP 124/70   Pulse (!) 110   Temp 98.4 F (36.9 C)   Ht 5\' 3"  (1.6 m)   Wt 126 lb (57.2 kg)   BMI 22.32 kg/m  General: Well developed white female in no acute distress Head: Normocephalic and atraumatic Eyes:  Sclerae anicteric, conjunctiva pink. Ears: Normal auditory acuity Lungs: Clear throughout to auscultation; no increased WOB. Heart: Slightly tachy; no M/R/G. Abdomen: Soft, non-distended.  BS present.  Minimal epigastric TTP. Musculoskeletal: Symmetrical with no gross deformities  Skin: No lesions on visible extremities Extremities: No edema  Neurological: Alert oriented x 4, grossly non-focal Psychological:  Alert and cooperative. Normal mood and affect  ASSESSMENT AND PLAN: *GERD and epigastric abdominal pain:  No improvement with pantoprazole 40 mg daily that was prescribed earlier this year.  Still continues with symptoms.  Would like EGD.  Will schedule with Dr. Carlean Purl.  **The risks, benefits, and alternatives to EGD were discussed with the patient and she consents to proceed.    CC:  Hassell Done, Mary-Margaret, *

## 2019-04-19 NOTE — Patient Instructions (Signed)
If you are age 72 or older, your body mass index should be between 23-30. Your Body mass index is 22.32 kg/m. If this is out of the aforementioned range listed, please consider follow up with your Primary Care Provider.  If you are age 27 or younger, your body mass index should be between 19-25. Your Body mass index is 22.32 kg/m. If this is out of the aformentioned range listed, please consider follow up with your Primary Care Provider.   You have been scheduled for an endoscopy. Please follow written instructions given to you at your visit today. If you use inhalers (even only as needed), please bring them with you on the day of your procedure. Your physician has requested that you go to www.startemmi.com and enter the access code given to you at your visit today. This web site gives a general overview about your procedure. However, you should still follow specific instructions given to you by our office regarding your preparation for the procedure.

## 2019-04-26 ENCOUNTER — Other Ambulatory Visit: Payer: Self-pay | Admitting: Internal Medicine

## 2019-04-26 ENCOUNTER — Ambulatory Visit: Payer: Medicare Other

## 2019-04-26 DIAGNOSIS — Z1159 Encounter for screening for other viral diseases: Secondary | ICD-10-CM | POA: Diagnosis not present

## 2019-04-26 LAB — SARS CORONAVIRUS 2 (TAT 6-24 HRS): SARS Coronavirus 2: NEGATIVE

## 2019-04-30 ENCOUNTER — Encounter: Payer: Self-pay | Admitting: Internal Medicine

## 2019-04-30 ENCOUNTER — Other Ambulatory Visit: Payer: Self-pay

## 2019-04-30 ENCOUNTER — Ambulatory Visit (AMBULATORY_SURGERY_CENTER): Payer: Medicare Other | Admitting: Internal Medicine

## 2019-04-30 VITALS — BP 143/71 | HR 78 | Temp 98.7°F | Resp 9 | Ht 63.0 in | Wt 126.0 lb

## 2019-04-30 DIAGNOSIS — K3189 Other diseases of stomach and duodenum: Secondary | ICD-10-CM | POA: Diagnosis not present

## 2019-04-30 DIAGNOSIS — K30 Functional dyspepsia: Secondary | ICD-10-CM

## 2019-04-30 DIAGNOSIS — R1013 Epigastric pain: Secondary | ICD-10-CM

## 2019-04-30 DIAGNOSIS — K219 Gastro-esophageal reflux disease without esophagitis: Secondary | ICD-10-CM | POA: Diagnosis not present

## 2019-04-30 MED ORDER — SODIUM CHLORIDE 0.9 % IV SOLN: 500.0000 mL | Freq: Once | INTRAVENOUS | Status: DC

## 2019-04-30 NOTE — Progress Notes (Signed)
Called to room to assist during endoscopic procedure.  Patient ID and intended procedure confirmed with present staff. Received instructions for my participation in the procedure from the performing physician.  

## 2019-04-30 NOTE — Patient Instructions (Addendum)
Overall things looked ok but there might have been some abnormal stomach lining where it meets the intestine so I took biopsies.  I will let you know.  I appreciate the opportunity to care for you. [name], MD, FACG  YOU HAD AN ENDOSCOPIC PROCEDURE TODAY AT [facility]:   Refer to the procedure report that was given to you for any specific questions about what was found during the examination.  If the procedure report does not answer your questions, please call your gastroenterologist to clarify.  If you requested that your care partner not be given the details of your procedure findings, then the procedure report has been included in a sealed envelope for you to review at your convenience later.  YOU SHOULD EXPECT: Some feelings of bloating in the abdomen. Passage of more gas than usual.  Walking can help get rid of the air that was put into your GI tract during the procedure and reduce the bloating. If you had a lower endoscopy (such as a colonoscopy or flexible sigmoidoscopy) you may notice spotting of blood in your stool or on the toilet paper. If you underwent a bowel prep for your procedure, you may not have a normal bowel movement for a few days.  Please Note:  You might notice some irritation and congestion in your nose or some drainage.  This is from the oxygen used during your procedure.  There is no need for concern and it should clear up in a day or so.  SYMPTOMS TO REPORT IMMEDIATELY:   Following upper endoscopy (EGD)  Vomiting of blood or coffee ground material  New chest pain or pain under the shoulder blades  Painful or persistently difficult swallowing  New shortness of breath  Fever of 100F or higher  Black, tarry-looking stools  For urgent or emergent issues, a gastroenterologist can be reached at any hour by calling [phone].   DIET:  We do recommend a small meal at first, but then you may proceed to your regular diet.  Drink plenty of  fluids but you should avoid alcoholic beverages for 24 hours.  ACTIVITY:  You should plan to take it easy for the rest of today and you should NOT DRIVE or use heavy machinery until tomorrow (because of the sedation medicines used during the test).    FOLLOW UP: Our staff will call the number listed on your records 48-72 hours following your procedure to check on you and address any questions or concerns that you may have regarding the information given to you following your procedure. If we do not reach you, we will leave a message.  We will attempt to reach you two times.  During this call, we will ask if you have developed any symptoms of COVID 19. If you develop any symptoms (ie: fever, flu-like symptoms, shortness of breath, cough etc.) before then, please call [phone].  If you test positive for Covid 19 in the 2 weeks post procedure, please call and report this information to [place].    If any biopsies were taken you will be contacted by phone or by letter within the next 1-3 weeks.  Please call us at [phone] if you have not heard about the biopsies in 3 weeks.    SIGNATURES/CONFIDENTIALITY: You and/or your care partner have signed paperwork which will be entered into your electronic medical record.  These signatures attest to the fact that that the information above on your After Visit Summary has been  reviewed and is understood.  Full responsibility of the confidentiality of this discharge information lies with you and/or your care-partner. 

## 2019-04-30 NOTE — Op Note (Signed)
[facility] Patient Name: Courtney Johnson Procedure Date: 04/30/2019 2:06 PM MRN: [id number] Endoscopist: [name] , MD Age: 73 Referring MD:  Date of Birth: 1946/03/11 Gender: Female Account #: [id number] Procedure:                Upper GI endoscopy Indications:              Dyspepsia Medicines:                Propofol per Anesthesia, Monitored Anesthesia Care Procedure:                Pre-Anesthesia Assessment:                           - Prior to the procedure, a History and Physical                            was performed, and patient medications and                            allergies were reviewed. The patient's tolerance of                            previous anesthesia was also reviewed. The risks                            and benefits of the procedure and the sedation                            options and risks were discussed with the patient.                            All questions were answered, and informed consent                            was obtained. Prior Anticoagulants: The patient has                            taken no previous anticoagulant or antiplatelet                            agents. ASA Grade Assessment: II - A patient with                            mild systemic disease. After reviewing the risks                            and benefits, the patient was deemed in                            satisfactory condition to undergo the procedure.                           After obtaining informed consent, the endoscope was  passed under direct vision. Throughout the                            procedure, the patient's blood pressure, pulse, and                            oxygen saturations were monitored continuously. The                            Endoscope was introduced through the mouth, and                            advanced to the second part of duodenum. The upper                            GI endoscopy was  accomplished without difficulty.                            The patient tolerated the procedure well. Scope In: Scope Out: Findings:                 The gastroesophageal flap valve was visualized                            endoscopically and classified as Hill Grade II                            (fold present, opens with respiration).                           Localized mild mucosal changes characterized by ?                            adenomatous epithelium were found in the prepyloric                            region of the stomach. Biopsies were taken with a                            cold forceps for histology. Verification of patient                            identification for the specimen was done. Estimated                            blood loss was minimal.                           The exam was otherwise without abnormality.                           The cardia and gastric fundus were normal on                            retroflexion. Complications:  No immediate complications. Estimated Blood Loss:     Estimated blood loss was minimal. Impression:               - Gastroesophageal flap valve classified as Hill                            Grade II (fold present, opens with respiration).                           - ? adenomatous epithelium mucosa in the prepyloric                            region of the stomach. Biopsied.                           - The examination was otherwise normal. Recommendation:           - Patient has a contact number available for                            emergencies. The signs and symptoms of potential                            delayed complications were discussed with the                            patient. Return to normal activities tomorrow.                            Written discharge instructions were provided to the                            patient.                           - Resume previous diet.                           -  Continue present medications.                           - Await pathology results.                           - Seems like functional dyspepsia/heartburn. ? if                            there could be some pre-pyloric issue so I took                            biopsies Iva Booparl E Gessner, MD 04/30/2019 2:20:19 PM This report has been signed electronically.

## 2019-04-30 NOTE — Progress Notes (Signed)
Vitals-CW Temp-JB  Pt's states no medical or surgical changes since previsit or office visit. 

## 2019-04-30 NOTE — Progress Notes (Signed)
Report to PACU, RN, vss, BBS= Clear.  

## 2019-05-02 ENCOUNTER — Telehealth: Payer: Self-pay | Admitting: *Deleted

## 2019-05-02 NOTE — Telephone Encounter (Signed)
  Follow up Call-  Call back number 04/30/2019 09/06/2018  Post procedure Call Back phone  # [phone]  Permission to leave phone message Yes Yes  Some recent data might be hidden     Patient questions:  Do you have a fever, pain , or abdominal swelling? No. Pain Score  0 *  Have you tolerated food without any problems? Yes.    Have you been able to return to your normal activities? Yes.    Do you have any questions about your discharge instructions: Diet   No. Medications  No. Follow up visit  No.  Do you have questions or concerns about your Care? No.  Actions: * If pain score is 4 or above: No action needed, pain <4.  1. Have you developed a fever since your procedure? no  2.   Have you had an respiratory symptoms (SOB or cough) since your procedure? no  3.   Have you tested positive for COVID 19 since your procedure no  4.   Have you had any family members/close contacts diagnosed with the COVID 19 since your procedure? no   If yes to any of these questions please route to Joylene John, RN and Alphonsa Gin, Therapist, sports.

## 2019-05-04 ENCOUNTER — Other Ambulatory Visit: Payer: Self-pay

## 2019-05-07 ENCOUNTER — Ambulatory Visit (INDEPENDENT_AMBULATORY_CARE_PROVIDER_SITE_OTHER): Payer: Medicare Other

## 2019-05-07 ENCOUNTER — Other Ambulatory Visit: Payer: Self-pay

## 2019-05-07 DIAGNOSIS — Z23 Encounter for immunization: Secondary | ICD-10-CM

## 2019-05-09 NOTE — Progress Notes (Signed)
Left message that biopsies ok - just some inflammation and metaplasia Will try to call back but does not need recall  Courtney Johnson - if you see this please respond and tell me how you are doing  [name], MD, [name]

## 2019-05-14 NOTE — Progress Notes (Signed)
Left another message that I had results

## 2019-06-11 ENCOUNTER — Encounter: Payer: Self-pay | Admitting: Family

## 2019-06-11 ENCOUNTER — Ambulatory Visit (INDEPENDENT_AMBULATORY_CARE_PROVIDER_SITE_OTHER): Payer: Medicare Other | Admitting: Family

## 2019-06-11 ENCOUNTER — Other Ambulatory Visit: Payer: Self-pay

## 2019-06-11 DIAGNOSIS — R399 Unspecified symptoms and signs involving the genitourinary system: Secondary | ICD-10-CM

## 2019-06-11 MED ORDER — CEPHALEXIN 500 MG PO CAPS: 500.0000 mg | ORAL_CAPSULE | Freq: Two times a day (BID) | ORAL | 0 refills | Status: DC

## 2019-06-11 NOTE — Progress Notes (Signed)
   Virtual Visit via telephone Note Due to COVID-19 pandemic this visit was conducted virtually. This visit type was conducted due to national recommendations for restrictions regarding the COVID-19 Pandemic (e.g. social distancing, sheltering in place) in an effort to limit this patient's exposure and mitigate transmission in our community. All issues noted in this document were discussed and addressed.  A physical exam was not performed with this format.  I connected with Courtney Johnson on 06/11/19 at 1:54 pm  by telephone and verified that I am speaking with the correct person using two identifiers. Courtney Johnson is currently located at home and no one is currently with her during visit. The provider, Evelina Dun, FNP is located in their office at time of visit.  I discussed the limitations, risks, security and privacy concerns of performing an evaluation and management service by telephone and the availability of in person appointments. I also discussed with the patient that there may be a patient responsible charge related to this service. The patient expressed understanding and agreed to proceed.   History and Present Illness:  Dysuria  This is a recurrent problem. The current episode started in the past 7 days. The problem occurs every urination. The problem has been gradually worsening. The quality of the pain is described as burning (pressure). The pain is at a severity of 4/10. The pain is mild. Associated symptoms include frequency, hesitancy and urgency. Pertinent negatives include no discharge, flank pain, hematuria, nausea or vomiting. She has tried increased fluids for the symptoms. The treatment provided no relief.      Review of Systems  Gastrointestinal: Negative for nausea and vomiting.  Genitourinary: Positive for dysuria, frequency, hesitancy and urgency. Negative for flank pain and hematuria.  All other systems reviewed and are  negative.    Observations/Objective: No SOB or distress noted  Assessment and Plan: 1. UTI symptoms Force fluids AZO over the counter X2 days RTO if symptoms worsen or do not improve - cephALEXin (KEFLEX) 500 MG capsule; Take 1 capsule (500 mg total) by mouth 2 (two) times daily.  Dispense: 14 capsule; Refill: 0       I discussed the assessment and treatment plan with the patient. The patient was provided an opportunity to ask questions and all were answered. The patient agreed with the plan and demonstrated an understanding of the instructions.   The patient was advised to call back or seek an in-person evaluation if the symptoms worsen or if the condition fails to improve as anticipated.  The above assessment and management plan was discussed with the patient. The patient verbalized understanding of and has agreed to the management plan. Patient is aware to call the clinic if symptoms persist or worsen. Patient is aware when to return to the clinic for a follow-up visit. Patient educated on when it is appropriate to go to the emergency department.   Time call ended:  2:01 pm  I provided 7 minutes of non-face-to-face time during this encounter.    Evelina Dun, FNP

## 2019-07-09 ENCOUNTER — Ambulatory Visit: Payer: Medicare HMO | Admitting: Physician Assistant

## 2019-07-09 ENCOUNTER — Encounter: Payer: Self-pay | Admitting: Physician Assistant

## 2019-07-09 VITALS — BP 134/82 | HR 88 | Temp 98.4°F | Ht 63.0 in | Wt 125.5 lb

## 2019-07-09 DIAGNOSIS — K602 Anal fissure, unspecified: Secondary | ICD-10-CM | POA: Diagnosis not present

## 2019-07-09 DIAGNOSIS — R1032 Left lower quadrant pain: Secondary | ICD-10-CM | POA: Diagnosis not present

## 2019-07-09 DIAGNOSIS — R1031 Right lower quadrant pain: Secondary | ICD-10-CM

## 2019-07-09 MED ORDER — DICYCLOMINE HCL 10 MG PO CAPS: 10.0000 mg | ORAL_CAPSULE | Freq: Two times a day (BID) | ORAL | 2 refills | Status: DC

## 2019-07-09 MED ORDER — AMBULATORY NON FORMULARY MEDICATION: 0 refills | Status: DC

## 2019-07-09 NOTE — Progress Notes (Signed)
Chief Complaint: Rectal pain  HPI:    Courtney Johnson is a 74 year old Caucasian female with a past medical history as listed below, known to Dr. Leone Payor, who was referred to me by Daphine Deutscher, Mary-Margaret, * for a complaint of rectal pain.      09/06/2018 colonoscopy with moderate diverticulosis in the sigmoid colon and narrowing associated with diverticular openings.  Otherwise normal.  No repeat recommended due to age.    04/19/2019 office visit with Doug Sou, PA for reflux.  At that time she had no improvement with Pantoprazole 40 daily.  She was requesting an EGD.    04/30/2019 EGD with gastroesophageal flap valve classified as Hill grade 2, question adenomatous epithelium mucosa in the prepyloric region of the stomach and otherwise normal.  Biopsies showed inflammation and metaplasia.  It was thought possibly reflux was functional.    Today, the patient presents to clinic and tells me that she has been having rectal burning pain for a couple of weeks now.  Explains that she has intermittent constipation and when she passes a stool this pain is somewhat worse, if she is constipated it hurts even more.  Also describes seeing some bright red blood on the toilet paper when wiping.  She has not used anything for this pain.    Also describes today that she continues with a lower abdominal cramping pain which sometimes is so bad that she has to bend over and hold her abdomen for a couple of hours before it goes away.  Rated as a 9/10 at its worst.  This typically comes on at least once a day and does not always result in a bowel movement.  Tells me she tries to maintain a high-fiber diet in order to keep her stools regular and for the most part they are.    Denies fever, chills, weight loss, heartburn, reflux or symptoms that awaken her from sleep.     Past Medical History:  Diagnosis Date  . Allergy   . Anxiety    not much sleep / stress   . Arthritis   . Cataract   . GERD (gastroesophageal reflux  disease)   . Hyperlipidemia   . Hypertension    elevated at Dr Isidore Moos = not high at home   . Osteoporosis     Past Surgical History:  Procedure Laterality Date  . ABDOMINAL HYSTERECTOMY     partial   . COLONOSCOPY    . EYE SURGERY     cataracts removed - bilateral     Current Outpatient Medications  Medication Sig Dispense Refill  . alendronate (FOSAMAX) 70 MG tablet TAKE 1 TABLET BY MOUTH ONCE A WEEK ON AN EMPTY STOMACH WITH A FULL GLASS OF WATER DO NOT LAY DOWN FOR 30 MINUTES 12 tablet 1  . ALPRAZolam (XANAX) 0.25 MG tablet Take 1 tablet (0.25 mg total) by mouth 2 (two) times daily as needed for sleep. 60 tablet 2  . cephALEXin (KEFLEX) 500 MG capsule Take 1 capsule (500 mg total) by mouth 2 (two) times daily. 14 capsule 0  . Cholecalciferol (VITAMIN D) 50 MCG (2000 UT) tablet Take 2,000 Units by mouth daily.    Marland Kitchen omeprazole (PRILOSEC) 20 MG capsule Take 1 capsule by mouth daily before breakfast.    . simvastatin (ZOCOR) 40 MG tablet Take 1 tablet (40 mg total) by mouth daily. 90 tablet 1   No current facility-administered medications for this visit.    Allergies as of 07/09/2019  . (No Known  Allergies)    Family History  Problem Relation Age of Onset  . Other Mother        age and frailty   . Heart disease Father        by-pass  . Heart attack Brother        54   . Heart disease Brother   . Diabetes Daughter   . Colon cancer Sister   . Cancer Sister        brain tumor - 38  . Heart attack Son        at age 63  . Esophageal cancer Neg Hx   . Rectal cancer Neg Hx   . Stomach cancer Neg Hx   . Liver cancer Neg Hx     Social History   Socioeconomic History  . Marital status: Widowed    Spouse name: Not on file  . Number of children: 3  . Years of education: Not on file  . Highest education level: Not on file  Occupational History  . Occupation: Realtor  Tobacco Use  . Smoking status: Never Smoker  . Smokeless tobacco: Never Used  Substance and Sexual  Activity  . Alcohol use: No  . Drug use: No  . Sexual activity: Not on file  Other Topics Concern  . Not on file  Social History Narrative   Widowed 2020   3 grown children    Realtor   No alcohol never smoker never drug user   Social Determinants of Corporate investment banker Strain:   . Difficulty of Paying Living Expenses: Not on file  Food Insecurity:   . Worried About Programme researcher, broadcasting/film/video in the Last Year: Not on file  . Ran Out of Food in the Last Year: Not on file  Transportation Needs:   . Lack of Transportation (Medical): Not on file  . Lack of Transportation (Non-Medical): Not on file  Physical Activity:   . Days of Exercise per Week: Not on file  . Minutes of Exercise per Session: Not on file  Stress:   . Feeling of Stress : Not on file  Social Connections:   . Frequency of Communication with Friends and Family: Not on file  . Frequency of Social Gatherings with Friends and Family: Not on file  . Attends Religious Services: Not on file  . Active Member of Clubs or Organizations: Not on file  . Attends Banker Meetings: Not on file  . Marital Status: Not on file  Intimate Partner Violence:   . Fear of Current or Ex-Partner: Not on file  . Emotionally Abused: Not on file  . Physically Abused: Not on file  . Sexually Abused: Not on file    Review of Systems:    Constitutional: No weight loss, fever or chills Cardiovascular: No chest pain   Respiratory: No SOB  Gastrointestinal: See HPI and otherwise negative   Physical Exam:  Vital signs: BP 134/82 (BP Location: Left Arm, Patient Position: Sitting, Cuff Size: Normal)   Pulse 88   Temp 98.4 F (36.9 C)   Ht 5\' 3"  (1.6 m) Comment: height measured without shoes  Wt 125 lb 8 oz (56.9 kg)   BMI 22.23 kg/m   Constitutional:   Pleasant Caucasian female appears to be in NAD, Well developed, Well nourished, alert and cooperative Respiratory: Respirations even and unlabored. Lungs clear to  auscultation bilaterally.   No wheezes, crackles, or rhonchi.  Cardiovascular: Normal S1, S2. No MRG. Regular rate  and rhythm. No peripheral edema, cyanosis or pallor.  Gastrointestinal:  Soft, nondistended, nontender. No rebound or guarding. Normal bowel sounds. No appreciable masses or hepatomegaly. Rectal: External: 2 external hemorrhoids, noninflamed, also small posterior fissure very tender to palpation; internal exam: Unable to complete due to tenderness and increased sphincter tone Psychiatric: Demonstrates good judgement and reason without abnormal affect or behaviors.  No recent labs/imaging.  Assessment: 1.  Anal fissure: Seen at time of exam today with pain and some bleeding off and on 2.  Lower abdominal cramping pain: Normal colonoscopy earlier last year, most likely IBS  Plan: 1.  Prescribed Nitroglycerin ointment 3 times daily x6 to 8 weeks. 2.  Recommend the patient buy over-the-counter RectiCare with lidocaine and apply as needed 3.  Patient should try to maintain soft solid stools 3 use of fiber and water. 4.  Prescribed Dicyclomine 10 mg twice daily, every morning and 20 to 30 minutes before dinner #60 with 3 refills. 5.  Discussed with patient that we can titrate medication above up or down pending results. 6.  Patient to follow in clinic with me in 3 to 4 weeks or sooner if necessary.  Ellouise Newer, PA-C [facility] 07/09/2019, 10:20 AM  Cc: Chevis Pretty, *

## 2019-07-09 NOTE — Patient Instructions (Addendum)
If you are age 73 or older, your body mass index should be between 23-30. Your Body mass index is 22.23 kg/m. If this is out of the aforementioned range listed, please consider follow up with your Primary Care Provider.  If you are age 88 or younger, your body mass index should be between 19-25. Your Body mass index is 22.23 kg/m. If this is out of the aformentioned range listed, please consider follow up with your Primary Care Provider.   We have sent a prescription for nitroglycerin 0.125% gel to [facility]. You should apply a pea size amount to your rectum three times daily x 6-8 weeks.  [facility]'s information is below: Address: [address], [place], [place] [address]  [phone]  *Please DO NOT go directly from our office to pick up this medication! Give the pharmacy 1 day to process the prescription as this is compounded and takes time to make.  We have sent the following medications to your pharmacy for you to pick up at your convenience: Dicyclomine  Reta-care with lidocaine OTC as needed.

## 2019-07-16 DIAGNOSIS — H524 Presbyopia: Secondary | ICD-10-CM | POA: Diagnosis not present

## 2019-07-18 ENCOUNTER — Ambulatory Visit: Payer: Self-pay | Admitting: Nurse Practitioner

## 2019-07-19 ENCOUNTER — Other Ambulatory Visit: Payer: Self-pay | Admitting: Nurse Practitioner

## 2019-07-19 DIAGNOSIS — M810 Age-related osteoporosis without current pathological fracture: Secondary | ICD-10-CM

## 2019-07-20 ENCOUNTER — Other Ambulatory Visit: Payer: Self-pay

## 2019-07-23 ENCOUNTER — Other Ambulatory Visit: Payer: Self-pay

## 2019-07-23 ENCOUNTER — Ambulatory Visit (INDEPENDENT_AMBULATORY_CARE_PROVIDER_SITE_OTHER): Payer: Medicare HMO | Admitting: Nurse Practitioner

## 2019-07-23 ENCOUNTER — Encounter: Payer: Self-pay | Admitting: Nurse Practitioner

## 2019-07-23 VITALS — BP 126/78 | HR 85 | Temp 98.2°F | Resp 20 | Ht 63.0 in | Wt 125.0 lb

## 2019-07-23 DIAGNOSIS — K219 Gastro-esophageal reflux disease without esophagitis: Secondary | ICD-10-CM | POA: Diagnosis not present

## 2019-07-23 DIAGNOSIS — E785 Hyperlipidemia, unspecified: Secondary | ICD-10-CM | POA: Diagnosis not present

## 2019-07-23 DIAGNOSIS — M810 Age-related osteoporosis without current pathological fracture: Secondary | ICD-10-CM | POA: Diagnosis not present

## 2019-07-23 DIAGNOSIS — F411 Generalized anxiety disorder: Secondary | ICD-10-CM | POA: Diagnosis not present

## 2019-07-23 MED ORDER — ALENDRONATE SODIUM 70 MG PO TABS: ORAL_TABLET | ORAL | 0 refills | Status: DC

## 2019-07-23 MED ORDER — ALPRAZOLAM 0.25 MG PO TABS: 0.2500 mg | ORAL_TABLET | Freq: Two times a day (BID) | ORAL | 2 refills | Status: DC | PRN

## 2019-07-23 MED ORDER — SIMVASTATIN 40 MG PO TABS: 40.0000 mg | ORAL_TABLET | Freq: Every day | ORAL | 1 refills | Status: DC

## 2019-07-23 NOTE — Patient Instructions (Signed)

## 2019-07-23 NOTE — Progress Notes (Signed)
Subjective:    Patient ID: Courtney Johnson, female    DOB: Nov 17, 1945, 74 y.o.   MRN: [id number]   Chief Complaint: Medical Management of Chronic Issues    HPI:  1. Gastroesophageal reflux disease, unspecified whether esophagitis present Is on no rx meds. Has had no recent symptoms.  2. Hyperlipidemia, unspecified hyperlipidemia type Does try to watch diet and does occasional exercise. Lab Results  Component Value Date   CHOL 154 01/15/2019   HDL 62 01/15/2019   LDLCALC 66 01/15/2019   TRIG 129 01/15/2019   CHOLHDL 2.5 01/15/2019     3. Generalized anxiety disorder Is on xanax bid prn. She only takes 1-2 x a week. GAD 7 : Generalized Anxiety Score 07/23/2019 12/05/2017  Nervous, Anxious, on Edge 0 0  Control/stop worrying 0 0  Worry too much - different things 0 0  Trouble relaxing 0 1  Restless 0 0  Easily annoyed or irritable 0 0  Afraid - awful might happen 0 0  Total GAD 7 Score 0 1  Anxiety Difficulty Not difficult at all Not difficult at all      4. Age-related osteoporosis without current pathological fracture Had dexascan on 11/25/17. t score was -2.7. is on fosamax daily    Outpatient Encounter Medications as of 07/23/2019  Medication Sig  . alendronate (FOSAMAX) 70 MG tablet TAKE 1 TABLET BY MOUTH ONCE A WEEK ON AN EMPTY STOMACH WITH A FULL GLASS OF WATER. DO NOT LAY DOWN FOR 30 MINUTES  . ALPRAZolam (XANAX) 0.25 MG tablet Take 1 tablet (0.25 mg total) by mouth 2 (two) times daily as needed for sleep.  . AMBULATORY NON FORMULARY MEDICATION Nitroglycerine ointment 0.125 %  Apply a pea sized amount internally four times daily. Dispense 30 GM zero refill  . Cholecalciferol (VITAMIN D) 50 MCG (2000 UT) tablet Take 2,000 Units by mouth daily.  Marland Kitchen dicyclomine (BENTYL) 10 MG capsule Take 1 capsule (10 mg total) by mouth 2 (two) times daily. 30 minutes before breakfast and lunch  . Multiple Vitamins-Minerals (ZINC PO) Take 1 tablet by mouth daily.  . simvastatin  (ZOCOR) 40 MG tablet Take 1 tablet (40 mg total) by mouth daily.     Past Surgical History:  Procedure Laterality Date  . ABDOMINAL HYSTERECTOMY     partial   . COLONOSCOPY    . EYE SURGERY     cataracts removed - bilateral     Family History  Problem Relation Age of Onset  . Other Mother        age and frailty   . Heart disease Father        by-pass  . Heart attack Brother        66   . Heart disease Brother   . Diabetes Daughter   . Colon cancer Sister   . Cancer Sister        brain tumor - 54  . Heart attack Son        at age 41  . Esophageal cancer Neg Hx   . Rectal cancer Neg Hx   . Stomach cancer Neg Hx   . Liver cancer Neg Hx     New complaints: None today  Social history: Lives by herself and family checks n her daily  Controlled substance contract: 01/16/19    Review of Systems  Constitutional: Negative for diaphoresis.  Eyes: Negative for pain.  Respiratory: Negative for shortness of breath.   Cardiovascular: Negative for chest pain, palpitations and  leg swelling.  Gastrointestinal: Negative for abdominal pain.  Endocrine: Negative for polydipsia.  Skin: Negative for rash.  Neurological: Negative for dizziness, weakness and headaches.  Hematological: Does not bruise/bleed easily.  All other systems reviewed and are negative.      Objective:   Physical Exam Vitals and nursing note reviewed.  Constitutional:      General: She is not in acute distress.    Appearance: Normal appearance. She is well-developed.  HENT:     Head: Normocephalic.     Nose: Nose normal.  Eyes:     Pupils: Pupils are equal, round, and reactive to light.  Neck:     Vascular: No carotid bruit or JVD.  Cardiovascular:     Rate and Rhythm: Normal rate and regular rhythm.     Heart sounds: Normal heart sounds.  Pulmonary:     Effort: Pulmonary effort is normal. No respiratory distress.     Breath sounds: Normal breath sounds. No wheezing or rales.  Chest:      Chest wall: No tenderness.  Abdominal:     General: Bowel sounds are normal. There is no distension or abdominal bruit.     Palpations: Abdomen is soft. There is no hepatomegaly, splenomegaly, mass or pulsatile mass.     Tenderness: There is no abdominal tenderness.  Musculoskeletal:        General: Normal range of motion.     Cervical back: Normal range of motion and neck supple.  Lymphadenopathy:     Cervical: No cervical adenopathy.  Skin:    General: Skin is warm and dry.  Neurological:     Mental Status: She is alert and oriented to person, place, and time.     Deep Tendon Reflexes: Reflexes are normal and symmetric.  Psychiatric:        Behavior: Behavior normal.        Thought Content: Thought content normal.        Judgment: Judgment normal.     BP 126/78   Pulse 85   Temp 98.2 F (36.8 C) (Temporal)   Resp 20   Ht 5\' 3"  (1.6 m)   Wt 125 lb (56.7 kg)   SpO2 99%   BMI 22.14 kg/m        Assessment & Plan:  Courtney Johnson comes in today with chief complaint of Medical Management of Chronic Issues   Diagnosis and orders addressed:  1. Gastroesophageal reflux disease, unspecified whether esophagitis present Avoid spicy foods Do not eat 2 hours prior to bedtime  2. Hyperlipidemia, unspecified hyperlipidemia type Low fta diet - simvastatin (ZOCOR) 40 MG tablet; Take 1 tablet (40 mg total) by mouth daily.  Dispense: 90 tablet; Refill: 1  3. Generalized anxiety disorder Stress management - ALPRAZolam (XANAX) 0.25 MG tablet; Take 1 tablet (0.25 mg total) by mouth 2 (two) times daily as needed for sleep.  Dispense: 60 tablet; Refill: 2  4. Age-related osteoporosis without current pathological fracture Weight bearing exercises encouragaed - alendronate (FOSAMAX) 70 MG tablet; Take with a full glass of water on an empty stomach.  Dispense: 12 tablet; Refill: 0   Labs pending Health Maintenance reviewed Diet and exercise encouraged  Follow up plan: 6  months   Mary-Margaret [name], FNP

## 2019-07-24 LAB — CBC WITH DIFFERENTIAL/PLATELET
Basophils Absolute: 0.1 10*3/uL (ref 0.0–0.2)
Basos: 1 %
EOS (ABSOLUTE): 0.1 10*3/uL (ref 0.0–0.4)
Eos: 1 %
Hematocrit: 42.3 % (ref 34.0–46.6)
Hemoglobin: 14.3 g/dL (ref 11.1–15.9)
Immature Grans (Abs): 0 10*3/uL (ref 0.0–0.1)
Immature Granulocytes: 0 %
Lymphocytes Absolute: 1.8 10*3/uL (ref 0.7–3.1)
Lymphs: 30 %
MCH: 30.8 pg (ref 26.6–33.0)
MCHC: 33.8 g/dL (ref 31.5–35.7)
MCV: 91 fL (ref 79–97)
Monocytes Absolute: 0.3 10*3/uL (ref 0.1–0.9)
Monocytes: 5 %
Neutrophils Absolute: 3.6 10*3/uL (ref 1.4–7.0)
Neutrophils: 63 %
Platelets: 310 10*3/uL (ref 150–450)
RBC: 4.64 x10E6/uL (ref 3.77–5.28)
RDW: 12.2 % (ref 11.7–15.4)
WBC: 5.8 10*3/uL (ref 3.4–10.8)

## 2019-07-24 LAB — CMP14+EGFR
ALT: 24 IU/L (ref 0–32)
AST: 29 IU/L (ref 0–40)
Albumin/Globulin Ratio: 1.9 (ref 1.2–2.2)
Albumin: 4.6 g/dL (ref 3.7–4.7)
Alkaline Phosphatase: 63 IU/L (ref 39–117)
BUN/Creatinine Ratio: 12 (ref 12–28)
BUN: 9 mg/dL (ref 8–27)
Bilirubin Total: 0.3 mg/dL (ref 0.0–1.2)
CO2: 23 mmol/L (ref 20–29)
Calcium: 9.9 mg/dL (ref 8.7–10.3)
Chloride: 107 mmol/L — ABNORMAL HIGH (ref 96–106)
Creatinine, Ser: 0.75 mg/dL (ref 0.57–1.00)
GFR calc Af Amer: 92 mL/min/{1.73_m2} (ref >59)
GFR calc non Af Amer: 80 mL/min/{1.73_m2} (ref >59)
Globulin, Total: 2.4 g/dL (ref 1.5–4.5)
Glucose: 86 mg/dL (ref 65–99)
Potassium: 4 mmol/L (ref 3.5–5.2)
Sodium: 146 mmol/L — ABNORMAL HIGH (ref 134–144)
Total Protein: 7 g/dL (ref 6.0–8.5)

## 2019-07-24 LAB — LIPID PANEL
Chol/HDL Ratio: 2.7 ratio (ref 0.0–4.4)
Cholesterol, Total: 164 mg/dL (ref 100–199)
HDL: 61 mg/dL (ref >39)
LDL Chol Calc (NIH): 77 mg/dL (ref 0–99)
Triglycerides: 156 mg/dL — ABNORMAL HIGH (ref 0–149)
VLDL Cholesterol Cal: 26 mg/dL (ref 5–40)

## 2019-07-24 LAB — THYROID PANEL WITH TSH
Free Thyroxine Index: 1.8 (ref 1.2–4.9)
T3 Uptake Ratio: 21 % — ABNORMAL LOW (ref 24–39)
T4, Total: 8.5 ug/dL (ref 4.5–12.0)
TSH: 2.14 u[IU]/mL (ref 0.450–4.500)

## 2019-08-08 ENCOUNTER — Ambulatory Visit: Payer: Medicare HMO | Admitting: Physician Assistant

## 2019-08-08 ENCOUNTER — Encounter: Payer: Self-pay | Admitting: Physician Assistant

## 2019-08-08 VITALS — BP 112/82 | HR 98 | Temp 97.9°F | Ht 63.0 in | Wt 126.0 lb

## 2019-08-08 DIAGNOSIS — K641 Second degree hemorrhoids: Secondary | ICD-10-CM | POA: Diagnosis not present

## 2019-08-08 DIAGNOSIS — R1031 Right lower quadrant pain: Secondary | ICD-10-CM | POA: Diagnosis not present

## 2019-08-08 DIAGNOSIS — K644 Residual hemorrhoidal skin tags: Secondary | ICD-10-CM | POA: Diagnosis not present

## 2019-08-08 DIAGNOSIS — K602 Anal fissure, unspecified: Secondary | ICD-10-CM | POA: Diagnosis not present

## 2019-08-08 DIAGNOSIS — R1032 Left lower quadrant pain: Secondary | ICD-10-CM | POA: Diagnosis not present

## 2019-08-08 MED ORDER — HYDROCORTISONE (PERIANAL) 2.5 % EX CREA: TOPICAL_CREAM | CUTANEOUS | 0 refills | Status: DC

## 2019-08-08 MED ORDER — PHENYLEPHRINE IN HARD FAT 0.25 % RE SUPP: RECTAL | 0 refills | Status: DC

## 2019-08-08 NOTE — Patient Instructions (Addendum)
If you are age 73 or older, your body mass index should be between 23-30. Your Body mass index is 22.32 kg/m. If this is out of the aforementioned range listed, please consider follow up with your Primary Care Provider.  If you are age 38 or younger, your body mass index should be between 19-25. Your Body mass index is 22.32 kg/m. If this is out of the aformentioned range listed, please consider follow up with your Primary Care Provider.   We have sent the following medications to your pharmacy for you to pick up at your convenience:  Please use hydrocortisone 2.5% on the outside of the rectum two times daily  for 14 days  Please use prep H suppository OTC with hydrocortisone two times daily for 14 days.  Please call and let Victorino Dike know how you are doing after 14 days.  Thank you, Victorino Dike

## 2019-08-08 NOTE — Progress Notes (Addendum)
Chief Complaint: Follow-up fissure and lower abdominal pain  HPI:    Courtney Johnson is a 74 year old Caucasian female, known to Dr. Leone Payor, who returns clinic today for follow-up of her anal fissure and with a complaint of abdominal pain    09/06/2018 colonoscopy with moderate diverticulosis in the sigmoid colon and narrowing associated with diverticular openings.  Otherwise normal.  No repeat recommended due to age.    04/30/2019 EGD with gastroesophageal flap valve classified as Hill grade 2, question adenomatous epithelium mucosa in the prepyloric region of the stomach and otherwise normal.  Biopsy showed inflammation and metaplasia.  It was thought possibly reflux was functional.    07/09/2019 patient seen in clinic and described rectal burning for a couple of weeks.  Also some intermittent constipation.  Also describes some lower abdominal cramping which is sometimes so bad that she had to bend over and hold her abdomen for hours.  This typically happens once a day and did not always result in a bowel movement.  At that time diagnosed with a fissure.  Prescribed nitroglycerin ointment as well as RectiCare cream with lidocaine, prescribed Dicyclomine 10 mg twice daily.    Today, patient presents to clinic and explains that she no longer has any lower abdominal pain while using the Dicyclomine 10 mg twice daily.  Occasionally she will hear some "loud gurgling in there", but this is not every day.    Also tells me that she is almost finished with the Nitroglycerin ointment and is no longer having any rectal discomfort.  She does have some "throbbing" and annoyance from what she thinks are hemorrhoids still and wonders what she can do about this.  Has never tried treating them with anything.  Denies seeing any bright red blood.    Patient is very thankful for the help that we have provided for her.  Tells me she is having to teach her grandchildren because they have only been to school maybe 10 days.  Denies fever, chills, weight loss or symptoms that awaken her from sleep.  Past Medical History:  Diagnosis Date  . Allergy   . Anxiety    not much sleep / stress   . Arthritis   . Cataract   . GERD (gastroesophageal reflux disease)   . Hyperlipidemia   . Hypertension    elevated at Dr Isidore Moos = not high at home   . Osteoporosis     Past Surgical History:  Procedure Laterality Date  . ABDOMINAL HYSTERECTOMY     partial   . COLONOSCOPY    . EYE SURGERY     cataracts removed - bilateral     Current Outpatient Medications  Medication Sig Dispense Refill  . alendronate (FOSAMAX) 70 MG tablet Take with a full glass of water on an empty stomach. 12 tablet 0  . ALPRAZolam (XANAX) 0.25 MG tablet Take 1 tablet (0.25 mg total) by mouth 2 (two) times daily as needed for sleep. 60 tablet 2  . AMBULATORY NON FORMULARY MEDICATION Nitroglycerine ointment 0.125 %  Apply a pea sized amount internally four times daily. Dispense 30 GM zero refill 30 g 0  . Cholecalciferol (VITAMIN D) 50 MCG (2000 UT) tablet Take 2,000 Units by mouth daily.    Marland Kitchen dicyclomine (BENTYL) 10 MG capsule Take 1 capsule (10 mg total) by mouth 2 (two) times daily. 30 minutes before breakfast and lunch 60 capsule 2  . Multiple Vitamins-Minerals (ZINC PO) Take 1 tablet by mouth daily.    Marland Kitchen  simvastatin (ZOCOR) 40 MG tablet Take 1 tablet (40 mg total) by mouth daily. 90 tablet 1   No current facility-administered medications for this visit.    Allergies as of 08/08/2019  . (No Known Allergies)    Family History  Problem Relation Age of Onset  . Other Mother        age and frailty   . Heart disease Father        by-pass  . Heart attack Brother        54   . Heart disease Brother   . Diabetes Daughter   . Colon cancer Sister   . Cancer Sister        brain tumor - 39  . Heart attack Son        at age 45  . Esophageal cancer Neg Hx   . Rectal cancer Neg Hx   . Stomach cancer Neg Hx   . Liver cancer Neg Hx      Social History   Socioeconomic History  . Marital status: Widowed    Spouse name: Not on file  . Number of children: 3  . Years of education: Not on file  . Highest education level: Not on file  Occupational History  . Occupation: Realtor  Tobacco Use  . Smoking status: Never Smoker  . Smokeless tobacco: Never Used  Substance and Sexual Activity  . Alcohol use: No  . Drug use: No  . Sexual activity: Not on file  Other Topics Concern  . Not on file  Social History Narrative   Widowed 2020   3 grown children    Realtor   No alcohol never smoker never drug user   Social Determinants of Corporate investment banker Strain:   . Difficulty of Paying Living Expenses: Not on file  Food Insecurity:   . Worried About Programme researcher, broadcasting/film/video in the Last Year: Not on file  . Ran Out of Food in the Last Year: Not on file  Transportation Needs:   . Lack of Transportation (Medical): Not on file  . Lack of Transportation (Non-Medical): Not on file  Physical Activity:   . Days of Exercise per Week: Not on file  . Minutes of Exercise per Session: Not on file  Stress:   . Feeling of Stress : Not on file  Social Connections:   . Frequency of Communication with Friends and Family: Not on file  . Frequency of Social Gatherings with Friends and Family: Not on file  . Attends Religious Services: Not on file  . Active Member of Clubs or Organizations: Not on file  . Attends Banker Meetings: Not on file  . Marital Status: Not on file  Intimate Partner Violence:   . Fear of Current or Ex-Partner: Not on file  . Emotionally Abused: Not on file  . Physically Abused: Not on file  . Sexually Abused: Not on file    Review of Systems:    Constitutional: No weight loss, fever or chills Cardiovascular: No chest pain   Respiratory: No SOB Gastrointestinal: See HPI and otherwise negative   Physical Exam:  Vital signs: BP 112/82   Pulse 98   Temp 97.9 F (36.6 C)   Ht 5\' 3"   (1.6 m)   Wt 126 lb (57.2 kg)   BMI 22.32 kg/m   Constitutional:   Pleasant Elderly Caucasian female appears to be in NAD, Well developed, Well nourished, alert and cooperative Respiratory: Respirations even and unlabored.  Lungs clear to auscultation bilaterally.   No wheezes, crackles, or rhonchi.  Cardiovascular: Normal S1, S2. No MRG. Regular rate and rhythm. No peripheral edema, cyanosis or pallor.  Gastrointestinal:  Soft, nondistended, nontender. No rebound or guarding. Normal bowel sounds. No appreciable masses or hepatomegaly. Rectal: External: healed posterior fissure, no ttp, external hemorrhoids; Internal: bulging internal hemorrhoids Psychiatric: Demonstrates good judgement and reason without abnormal affect or behaviors.  MOST RECENT LABS AND IMAGING: CBC    Component Value Date/Time   WBC 5.8 07/23/2019 1516   WBC 5.4 03/06/2014 1117   WBC 4.1 08/30/2007 1009   RBC 4.64 07/23/2019 1516   RBC 4.3 03/06/2014 1117   RBC 4.50 08/30/2007 1009   HGB 14.3 07/23/2019 1516   HCT 42.3 07/23/2019 1516   PLT 310 07/23/2019 1516   MCV 91 07/23/2019 1516   MCH 30.8 07/23/2019 1516   MCH 31.0 03/06/2014 1117   MCHC 33.8 07/23/2019 1516   MCHC 34.0 03/06/2014 1117   MCHC 35.2 08/30/2007 1009   RDW 12.2 07/23/2019 1516   LYMPHSABS 1.8 07/23/2019 1516   EOSABS 0.1 07/23/2019 1516   BASOSABS 0.1 07/23/2019 1516    CMP     Component Value Date/Time   NA 146 (H) 07/23/2019 1516   K 4.0 07/23/2019 1516   CL 107 (H) 07/23/2019 1516   CO2 23 07/23/2019 1516   GLUCOSE 86 07/23/2019 1516   GLUCOSE 105 (H) 08/30/2007 1009   BUN 9 07/23/2019 1516   CREATININE 0.75 07/23/2019 1516   CALCIUM 9.9 07/23/2019 1516   PROT 7.0 07/23/2019 1516   ALBUMIN 4.6 07/23/2019 1516   AST 29 07/23/2019 1516   ALT 24 07/23/2019 1516   ALKPHOS 63 07/23/2019 1516   BILITOT 0.3 07/23/2019 1516   GFRNONAA 80 07/23/2019 1516   GFRAA 92 07/23/2019 1516    Assessment: 1.  Grade 2 internal  hemorrhoids: Patient tells me that sometimes these throb, uncertain if it is internal or external hemorrhoids which is giving her more discomfort 2.  External hemorrhoids 3.  Anal fissure: Healed after Nitroglycerin 4.  Lower abdominal pain: Likely IBS, better with Dicyclomine  Plan: 1.  Continue Dicyclomine 10 mg twice daily. 2.  Prescribed Hydrocortisone ointment 2.5% to be applied externally to hemorrhoids and also to a Preparation H suppository OTC twice daily times the next 2 weeks. 3.  Patient will let me know how she is doing after above treatment.  If continues to be bothered by hemorrhoids could discuss banding versus other. 4.  Can discontinue Nitroglycerin ointment 5.  Patient to follow in clinic as needed with me.  Ellouise Newer, PA-C [facility] 08/08/2019, 10:22 AM  Cc: Chevis Pretty, *

## 2019-10-11 ENCOUNTER — Other Ambulatory Visit: Payer: Self-pay | Admitting: Nurse Practitioner

## 2019-10-11 DIAGNOSIS — E785 Hyperlipidemia, unspecified: Secondary | ICD-10-CM

## 2019-12-08 DIAGNOSIS — R300 Dysuria: Secondary | ICD-10-CM | POA: Diagnosis not present

## 2019-12-17 DIAGNOSIS — R300 Dysuria: Secondary | ICD-10-CM | POA: Diagnosis not present

## 2020-01-04 ENCOUNTER — Other Ambulatory Visit: Payer: Self-pay | Admitting: Nurse Practitioner

## 2020-01-04 DIAGNOSIS — M810 Age-related osteoporosis without current pathological fracture: Secondary | ICD-10-CM

## 2020-01-21 ENCOUNTER — Encounter: Payer: Self-pay | Admitting: Nurse Practitioner

## 2020-01-21 ENCOUNTER — Ambulatory Visit (INDEPENDENT_AMBULATORY_CARE_PROVIDER_SITE_OTHER): Payer: Medicare HMO | Admitting: Nurse Practitioner

## 2020-01-21 ENCOUNTER — Other Ambulatory Visit: Payer: Self-pay

## 2020-01-21 VITALS — BP 142/82 | HR 79 | Temp 96.9°F | Resp 20 | Ht 63.0 in | Wt 125.0 lb

## 2020-01-21 DIAGNOSIS — K219 Gastro-esophageal reflux disease without esophagitis: Secondary | ICD-10-CM | POA: Diagnosis not present

## 2020-01-21 DIAGNOSIS — M810 Age-related osteoporosis without current pathological fracture: Secondary | ICD-10-CM | POA: Diagnosis not present

## 2020-01-21 DIAGNOSIS — F411 Generalized anxiety disorder: Secondary | ICD-10-CM

## 2020-01-21 DIAGNOSIS — E785 Hyperlipidemia, unspecified: Secondary | ICD-10-CM | POA: Diagnosis not present

## 2020-01-21 DIAGNOSIS — E782 Mixed hyperlipidemia: Secondary | ICD-10-CM | POA: Diagnosis not present

## 2020-01-21 MED ORDER — SIMVASTATIN 40 MG PO TABS: 40.0000 mg | ORAL_TABLET | Freq: Every day | ORAL | 1 refills | Status: DC

## 2020-01-21 MED ORDER — ALPRAZOLAM 0.25 MG PO TABS: 0.2500 mg | ORAL_TABLET | Freq: Two times a day (BID) | ORAL | 2 refills | Status: DC | PRN

## 2020-01-21 MED ORDER — ALENDRONATE SODIUM 70 MG PO TABS: ORAL_TABLET | ORAL | 0 refills | Status: DC

## 2020-01-21 MED ORDER — PANTOPRAZOLE SODIUM 40 MG PO TBEC: 40.0000 mg | DELAYED_RELEASE_TABLET | Freq: Every day | ORAL | 3 refills | Status: DC

## 2020-01-21 NOTE — Progress Notes (Signed)
Subjective:    Patient ID: Courtney Johnson, female    DOB: July 16, 1945, 74 y.o.   MRN: [id number]   Chief Complaint: Medical Management of Chronic Issues    HPI:  1. Mixed hyperlipidemia Does try to watch diet but does no dedicated exercise. Lab Results  Component Value Date   CHOL 164 07/23/2019   HDL 61 07/23/2019   LDLCALC 77 07/23/2019   TRIG 156 (H) 07/23/2019   CHOLHDL 2.7 07/23/2019   The 10-year ASCVD risk score Denman George DC Jr., et al., 2013) is: 14.9%   Values used to calculate the score:     Age: 43 years     Sex: Female     Is Non-Hispanic African American: No     Diabetic: No     Tobacco smoker: No     Systolic Blood Pressure: 150 mmHg     Is BP treated: No     HDL Cholesterol: 61 mg/dL     Total Cholesterol: 164 mg/dL   2. Gastroesophageal reflux disease, unspecified whether esophagitis present Only takes OTC meds as needed. She had gastroscopy that showed she had a fold in her stomach causing issues. She has had to use prilosec and that helps some with hr reflux symptoms.  3. Generalized anxiety disorder Has always been anxious. She is a Psychiatric nurse. GAD 7 : Generalized Anxiety Score 01/21/2020 07/23/2019 12/05/2017  Nervous, Anxious, on Edge 1 0 0  Control/stop worrying 0 0 0  Worry too much - different things 1 0 0  Trouble relaxing 0 0 1  Restless 0 0 0  Easily annoyed or irritable 0 0 0  Afraid - awful might happen 0 0 0  Total GAD 7 Score 2 0 1  Anxiety Difficulty Not difficult at all Not difficult at all Not difficult at all     4. Age-related osteoporosis without current pathological fracture Last dexscan as 12/05/17 with t score of -2.7. she does not do any weight bearing exercises. She is currently on fosamax daily    Outpatient Encounter Medications as of 01/21/2020  Medication Sig  . alendronate (FOSAMAX) 70 MG tablet TAKE 1 TABLET BY MOUTH ONCE A WEEK WITH A FULL GLASS OF WATER ON AN EMPTY STOMACH  . ALPRAZolam (XANAX) 0.25 MG tablet  Take 1 tablet (0.25 mg total) by mouth 2 (two) times daily as needed for sleep.  . AMBULATORY NON FORMULARY MEDICATION Nitroglycerine ointment 0.125 %  Apply a pea sized amount internally four times daily. Dispense 30 GM zero refill  . Cholecalciferol (VITAMIN D) 50 MCG (2000 UT) tablet Take 2,000 Units by mouth daily.  Marland Kitchen dicyclomine (BENTYL) 10 MG capsule Take 1 capsule (10 mg total) by mouth 2 (two) times daily. 30 minutes before breakfast and lunch  . hydrocortisone (ANUSOL-HC) 2.5 % rectal cream Use twice daily for 14 days  . phenylephrine (,USE FOR PREPARATION-H,) 0.25 % suppository Insert 1 suppository twice daily for 14 days  . simvastatin (ZOCOR) 40 MG tablet Take 1 tablet by mouth once daily     Past Surgical History:  Procedure Laterality Date  . ABDOMINAL HYSTERECTOMY     partial   . COLONOSCOPY    . EYE SURGERY     cataracts removed - bilateral     Family History  Problem Relation Age of Onset  . Other Mother        age and frailty   . Heart disease Father        by-pass  .  Heart attack Brother        54   . Heart disease Brother   . Diabetes Daughter   . Colon cancer Sister   . Cancer Sister        brain tumor - 50  . Heart attack Son        at age 20  . Esophageal cancer Neg Hx   . Rectal cancer Neg Hx   . Stomach cancer Neg Hx   . Liver cancer Neg Hx     New complaints: None today  Social history: Lives with her husband  Controlled substance contract: 01/16/19    Review of Systems  Constitutional: Negative for diaphoresis.  Eyes: Negative for pain.  Respiratory: Negative for shortness of breath.   Cardiovascular: Negative for chest pain, palpitations and leg swelling.  Gastrointestinal: Negative for abdominal pain.  Endocrine: Negative for polydipsia.  Skin: Negative for rash.  Neurological: Negative for dizziness, weakness and headaches.  Hematological: Does not bruise/bleed easily.  All other systems reviewed and are negative.        Objective:   Physical Exam Vitals and nursing note reviewed.  Constitutional:      General: She is not in acute distress.    Appearance: Normal appearance. She is well-developed.  HENT:     Head: Normocephalic.     Nose: Nose normal.  Eyes:     Pupils: Pupils are equal, round, and reactive to light.  Neck:     Vascular: No carotid bruit or JVD.  Cardiovascular:     Rate and Rhythm: Normal rate and regular rhythm.     Heart sounds: Normal heart sounds.  Pulmonary:     Effort: Pulmonary effort is normal. No respiratory distress.     Breath sounds: Normal breath sounds. No wheezing or rales.  Chest:     Chest wall: No tenderness.  Abdominal:     General: Bowel sounds are normal. There is no distension or abdominal bruit.     Palpations: Abdomen is soft. There is no hepatomegaly, splenomegaly, mass or pulsatile mass.     Tenderness: There is no abdominal tenderness.  Musculoskeletal:        General: Normal range of motion.     Cervical back: Normal range of motion and neck supple.  Lymphadenopathy:     Cervical: No cervical adenopathy.  Skin:    General: Skin is warm and dry.  Neurological:     Mental Status: She is alert and oriented to person, place, and time.     Deep Tendon Reflexes: Reflexes are normal and symmetric.  Psychiatric:        Behavior: Behavior normal.        Thought Content: Thought content normal.        Judgment: Judgment normal.     BP (!) 142/82   Pulse 79   Temp (!) 96.9 F (36.1 C) (Temporal)   Resp 20   Ht 5\' 3"  (1.6 m)   Wt 125 lb (56.7 kg)   SpO2 99%   BMI 22.14 kg/m          Assessment & Plan:  Courtney Johnson comes in today with chief complaint of Medical Management of Chronic Issues   Diagnosis and orders addressed:  1. Mixed hyperlipidemia Low fat diet - simvastatin (ZOCOR) 40 MG tablet; Take 1 tablet (40 mg total) by mouth daily.  Dispense: 90 tablet; Refill: 1   2. Gastroesophageal reflux disease, unspecified  whether esophagitis present Avoid spicy foods  Do not eat 2 hours prior to bedtime Added [protonix to meds - pantoprazole (PROTONIX) 40 MG tablet; Take 1 tablet (40 mg total) by mouth daily.  Dispense: 30 tablet; Refill: 3  3. Generalized anxiety disorder Stress management - ALPRAZolam (XANAX) 0.25 MG tablet; Take 1 tablet (0.25 mg total) by mouth 2 (two) times daily as needed for sleep.  Dispense: 60 tablet; Refill: 2  4. Age-related osteoporosis without current pathological fracture Weight bearing exercise - alendronate (FOSAMAX) 70 MG tablet; TAKE 1 TABLET BY MOUTH ONCE A WEEK WITH A FULL GLASS OF WATER ON AN EMPTY STOMACH  Dispense: 12 tablet; Refill: 0    Labs pending Health Maintenance reviewed Diet and exercise encouraged  Follow up plan: 6 months   [name], FNP

## 2020-01-21 NOTE — Patient Instructions (Signed)

## 2020-01-22 LAB — LIPID PANEL
Chol/HDL Ratio: 2.7 ratio (ref 0.0–4.4)
Cholesterol, Total: 161 mg/dL (ref 100–199)
HDL: 60 mg/dL (ref >39)
LDL Chol Calc (NIH): 81 mg/dL (ref 0–99)
Triglycerides: 114 mg/dL (ref 0–149)
VLDL Cholesterol Cal: 20 mg/dL (ref 5–40)

## 2020-01-22 LAB — CBC WITH DIFFERENTIAL/PLATELET
Basophils Absolute: 0.1 10*3/uL (ref 0.0–0.2)
Basos: 1 %
EOS (ABSOLUTE): 0.1 10*3/uL (ref 0.0–0.4)
Eos: 1 %
Hematocrit: 40.5 % (ref 34.0–46.6)
Hemoglobin: 13.7 g/dL (ref 11.1–15.9)
Immature Grans (Abs): 0 10*3/uL (ref 0.0–0.1)
Immature Granulocytes: 0 %
Lymphocytes Absolute: 2.1 10*3/uL (ref 0.7–3.1)
Lymphs: 30 %
MCH: 31.2 pg (ref 26.6–33.0)
MCHC: 33.8 g/dL (ref 31.5–35.7)
MCV: 92 fL (ref 79–97)
Monocytes Absolute: 0.4 10*3/uL (ref 0.1–0.9)
Monocytes: 5 %
Neutrophils Absolute: 4.3 10*3/uL (ref 1.4–7.0)
Neutrophils: 63 %
Platelets: 296 10*3/uL (ref 150–450)
RBC: 4.39 x10E6/uL (ref 3.77–5.28)
RDW: 12.2 % (ref 11.7–15.4)
WBC: 6.9 10*3/uL (ref 3.4–10.8)

## 2020-01-22 LAB — CMP14+EGFR
ALT: 27 IU/L (ref 0–32)
AST: 33 IU/L (ref 0–40)
Albumin/Globulin Ratio: 1.8 (ref 1.2–2.2)
Albumin: 4.7 g/dL (ref 3.7–4.7)
Alkaline Phosphatase: 58 IU/L (ref 48–121)
BUN/Creatinine Ratio: 13 (ref 12–28)
BUN: 10 mg/dL (ref 8–27)
Bilirubin Total: 0.4 mg/dL (ref 0.0–1.2)
CO2: 27 mmol/L (ref 20–29)
Calcium: 9.9 mg/dL (ref 8.7–10.3)
Chloride: 103 mmol/L (ref 96–106)
Creatinine, Ser: 0.77 mg/dL (ref 0.57–1.00)
GFR calc Af Amer: 89 mL/min/{1.73_m2} (ref >59)
GFR calc non Af Amer: 77 mL/min/{1.73_m2} (ref >59)
Globulin, Total: 2.6 g/dL (ref 1.5–4.5)
Glucose: 95 mg/dL (ref 65–99)
Potassium: 3.6 mmol/L (ref 3.5–5.2)
Sodium: 144 mmol/L (ref 134–144)
Total Protein: 7.3 g/dL (ref 6.0–8.5)

## 2020-02-01 ENCOUNTER — Telehealth: Payer: Self-pay | Admitting: Nurse Practitioner

## 2020-02-01 NOTE — Telephone Encounter (Signed)
Informed patient that Courtney Johnson will be in the office on Monday and that the referring office will contact her with an appt date and time. Pt states that she continues to have burning sensation in chest.

## 2020-02-01 NOTE — Telephone Encounter (Signed)
Pt called stating that she talked to MMM at her last visit (01/21/20) about possibly getting a stress test done. Pt says she has decided that she does want one and wants someone to call her back with referral info.

## 2020-02-04 NOTE — Telephone Encounter (Signed)
Pt wants to wait on scheduling stress test and see how medication works out.

## 2020-03-13 ENCOUNTER — Ambulatory Visit (INDEPENDENT_AMBULATORY_CARE_PROVIDER_SITE_OTHER): Payer: Medicare HMO | Admitting: Family Medicine

## 2020-03-13 ENCOUNTER — Encounter: Payer: Self-pay | Admitting: Family Medicine

## 2020-03-13 DIAGNOSIS — J069 Acute upper respiratory infection, unspecified: Secondary | ICD-10-CM | POA: Diagnosis not present

## 2020-03-13 MED ORDER — AZITHROMYCIN 250 MG PO TABS: ORAL_TABLET | ORAL | 0 refills | Status: DC

## 2020-03-13 MED ORDER — FLUTICASONE PROPIONATE 50 MCG/ACT NA SUSP: 1.0000 | Freq: Two times a day (BID) | NASAL | 6 refills | Status: DC | PRN

## 2020-03-13 NOTE — Progress Notes (Signed)
Virtual Visit via telephone Note  I connected with Courtney Johnson on 03/13/20 at [phone] by telephone and verified that I am speaking with the correct person using two identifiers. Courtney Johnson is currently located at home and patient are currently with her during visit. The provider, Elige Radon Skye Rodarte, MD is located in their office at time of visit.  Call ended at [phone]  I discussed the limitations, risks, security and privacy concerns of performing an evaluation and management service by telephone and the availability of in person appointments. I also discussed with the patient that there may be a patient responsible charge related to this service. The patient expressed understanding and agreed to proceed.   History and Present Illness: Patient is calling in for sinus pressure and headaches and now body aches.  She has a lot of congestion and headaches. She has cough and it is non productive. She has a lot of low grade fever. She goes to church but does not know of any exposure.  She is not vaccinated for covid.  She denies shortness of breath or wheezing.  She is taking alka seltzer and it helps some.  She started 4 days ago.  No diagnosis found.  Outpatient Encounter Medications as of 03/13/2020  Medication Sig  . alendronate (FOSAMAX) 70 MG tablet TAKE 1 TABLET BY MOUTH ONCE A WEEK WITH A FULL GLASS OF WATER ON AN EMPTY STOMACH  . ALPRAZolam (XANAX) 0.25 MG tablet Take 1 tablet (0.25 mg total) by mouth 2 (two) times daily as needed for sleep.  . AMBULATORY NON FORMULARY MEDICATION Nitroglycerine ointment 0.125 %  Apply a pea sized amount internally four times daily. Dispense 30 GM zero refill  . Cholecalciferol (VITAMIN D) 50 MCG (2000 UT) tablet Take 2,000 Units by mouth daily.  . hydrocortisone (ANUSOL-HC) 2.5 % rectal cream Use twice daily for 14 days (Patient not taking: Reported on 01/21/2020)  . pantoprazole (PROTONIX) 40 MG tablet Take 1 tablet (40 mg total) by mouth daily.    . phenylephrine (,USE FOR PREPARATION-H,) 0.25 % suppository Insert 1 suppository twice daily for 14 days (Patient not taking: Reported on 01/21/2020)  . simvastatin (ZOCOR) 40 MG tablet Take 1 tablet (40 mg total) by mouth daily.   No facility-administered encounter medications on file as of 03/13/2020.    Review of Systems  Constitutional: Positive for fever. Negative for chills.  HENT: Positive for congestion, postnasal drip, rhinorrhea, sinus pressure and sneezing. Negative for ear discharge, ear pain and sore throat.   Eyes: Negative for pain, redness and visual disturbance.  Respiratory: Positive for cough. Negative for chest tightness, shortness of breath and wheezing.   Cardiovascular: Negative for chest pain and leg swelling.  Genitourinary: Negative for difficulty urinating and dysuria.  Musculoskeletal: Positive for myalgias. Negative for back pain and gait problem.  Skin: Negative for rash.  Neurological: Negative for light-headedness and headaches.  Psychiatric/Behavioral: Negative for agitation and behavioral problems.  All other systems reviewed and are negative.   Observations/Objective: Patient sounds comfortable and in no acute distress  Assessment and Plan: Problem List Items Addressed This Visit    None    Visit Diagnoses    Viral upper respiratory infection    -  Primary   Relevant Medications   azithromycin (ZITHROMAX) 250 MG tablet   fluticasone (FLONASE) 50 MCG/ACT nasal spray   Other Relevant Orders   Novel Coronavirus, NAA ([facility])      Will treat like possible Covid, recommended for testing  and quarantine. Follow up plan: Return if symptoms worsen or fail to improve, for Follow-up as needed.     I discussed the assessment and treatment plan with the patient. The patient was provided an opportunity to ask questions and all were answered. The patient agreed with the plan and demonstrated an understanding of the instructions.   The patient was  advised to call back or seek an in-person evaluation if the symptoms worsen or if the condition fails to improve as anticipated.  The above assessment and management plan was discussed with the patient. The patient verbalized understanding of and has agreed to the management plan. Patient is aware to call the clinic if symptoms persist or worsen. Patient is aware when to return to the clinic for a follow-up visit. Patient educated on when it is appropriate to go to the emergency department.    I provided 12 minutes of non-face-to-face time during this encounter.    Nils Pyle, MD

## 2020-03-13 NOTE — Addendum Note (Signed)
Addended by: Margorie John on: 03/13/2020 10:18 AM   Modules accepted: Orders

## 2020-03-14 LAB — NOVEL CORONAVIRUS, NAA: SARS-CoV-2, NAA: DETECTED — AB

## 2020-03-14 LAB — SARS-COV-2, NAA 2 DAY TAT

## 2020-03-15 ENCOUNTER — Telehealth (HOSPITAL_COMMUNITY): Payer: Self-pay | Admitting: Adult Health

## 2020-03-15 NOTE — Telephone Encounter (Signed)
Called and LMOM regarding monoclonal antibody treatment for COVID 19 given to those who are at risk for complications and/or hospitalization of the virus.  Patient meets criteria based on: age  Call back number given: [phone]  My chart message: sent  Lillard Anes, NP

## 2020-04-22 DIAGNOSIS — Z1231 Encounter for screening mammogram for malignant neoplasm of breast: Secondary | ICD-10-CM | POA: Diagnosis not present

## 2020-05-20 ENCOUNTER — Telehealth: Payer: Self-pay | Admitting: Nurse Practitioner

## 2020-05-20 NOTE — Telephone Encounter (Signed)
°  Incoming Patient Call  05/20/2020  What symptoms do you have? Pt states that she knows she has a uti   How long have you been sick? Couple of days   Have you been seen for this problem? no  If your provider decides to give you a prescription, which pharmacy would you like for it to be sent to? [facility] in [place]   Patient informed that this information will be sent to the clinical staff for review and that they should receive a follow up call.

## 2020-05-20 NOTE — Telephone Encounter (Signed)
Spoke with patient and explained she would need to be seen for evaluation.  Appointment scheduled on Friday with Dr. Darlyn Read.

## 2020-05-23 ENCOUNTER — Ambulatory Visit (INDEPENDENT_AMBULATORY_CARE_PROVIDER_SITE_OTHER): Payer: Medicare HMO | Admitting: Family Medicine

## 2020-05-23 ENCOUNTER — Other Ambulatory Visit: Payer: Self-pay

## 2020-05-23 ENCOUNTER — Encounter: Payer: Self-pay | Admitting: Family Medicine

## 2020-05-23 VITALS — BP 134/84 | HR 94 | Temp 96.9°F | Ht 63.0 in | Wt 123.2 lb

## 2020-05-23 DIAGNOSIS — R399 Unspecified symptoms and signs involving the genitourinary system: Secondary | ICD-10-CM

## 2020-05-23 DIAGNOSIS — N3090 Cystitis, unspecified without hematuria: Secondary | ICD-10-CM | POA: Diagnosis not present

## 2020-05-23 LAB — MICROSCOPIC EXAMINATION

## 2020-05-23 LAB — URINALYSIS, COMPLETE
Bilirubin, UA: NEGATIVE
Glucose, UA: NEGATIVE
Ketones, UA: NEGATIVE
Nitrite, UA: NEGATIVE
Protein,UA: NEGATIVE
Specific Gravity, UA: 1.01 (ref 1.005–1.030)
Urobilinogen, Ur: 0.2 mg/dL (ref 0.2–1.0)
pH, UA: 5.5 (ref 5.0–7.5)

## 2020-05-23 MED ORDER — SULFAMETHOXAZOLE-TRIMETHOPRIM 800-160 MG PO TABS: 1.0000 | ORAL_TABLET | Freq: Two times a day (BID) | ORAL | 0 refills | Status: DC

## 2020-05-23 NOTE — Progress Notes (Signed)
Chief Complaint  Patient presents with  . Abdominal Pain    right lower  . Abdominal Pain    HPI  Patient presents today for burning with urination and frequency for several days. Denies fever . No flank pain. No nausea, vomiting. Feels mostly suprapubic pressure   PMH: Smoking status noted ROS: Per HPI  Objective: BP 134/84   Pulse 94   Temp (!) 96.9 F (36.1 C) (Temporal)   Ht 5\' 3"  (1.6 m)   Wt 123 lb 3.2 oz (55.9 kg)   BMI 21.82 kg/m  Gen: NAD, alert, cooperative with exam HEENT: NCAT, EOMI, PERRL CV: RRR, good S1/S2, no murmur Resp: CTABL, no wheezes, non-labored Abd: Soft, mildly tender a suprapubic area.  BS present, no guarding or organomegaly Ext: No edema, warm Neuro: Alert and oriented, No gross deficits  Assessment and plan:  1. UTI symptoms   2. Cystitis     Meds ordered this encounter  Medications  . sulfamethoxazole-trimethoprim (BACTRIM DS) 800-160 MG tablet    Sig: Take 1 tablet by mouth 2 (two) times daily.    Dispense:  14 tablet    Refill:  0    Orders Placed This Encounter  Procedures  . Urine Culture    Standing Status:   Future    Number of Occurrences:   1    Standing Expiration Date:   06/23/2020  . Urinalysis, Complete    Follow up as needed.  08/21/2020, MD

## 2020-05-25 LAB — URINE CULTURE

## 2020-06-03 ENCOUNTER — Telehealth: Payer: Self-pay

## 2020-06-03 NOTE — Telephone Encounter (Signed)
  Incoming Patient Call  06/03/2020  What symptoms do you have? Lower abdominal pain, pressure no pain when she urinates  How long have you been sick? A few weeks  Have you been seen for this problem? Yes about two weeks ago she was given an antibiotic she finished it and feels like she still has a UTI, she wants to know if she can bring in urine specimen does not want to be seen because she was exposed to COVID at church  If your provider decides to give you a prescription, which pharmacy would you like for it to be sent to? [facility] [place]    Patient informed that this information will be sent to the clinical staff for review and that they should receive a follow up call.

## 2020-06-03 NOTE — Telephone Encounter (Signed)
Advised pt her urine culture from visit didn't show UTI so she shouldn't need antibiotic but offered to schedule pt virtual visit with provider. Pt declined.

## 2020-06-18 ENCOUNTER — Other Ambulatory Visit: Payer: Self-pay | Admitting: Nurse Practitioner

## 2020-06-18 DIAGNOSIS — M810 Age-related osteoporosis without current pathological fracture: Secondary | ICD-10-CM

## 2020-06-19 ENCOUNTER — Telehealth: Payer: Medicare HMO | Admitting: Physician Assistant

## 2020-06-19 DIAGNOSIS — R0981 Nasal congestion: Secondary | ICD-10-CM | POA: Diagnosis not present

## 2020-06-19 MED ORDER — IPRATROPIUM BROMIDE 0.03 % NA SOLN: 2.0000 | Freq: Two times a day (BID) | NASAL | 12 refills | Status: DC

## 2020-06-19 NOTE — Progress Notes (Signed)
We are sorry that you are not feeling well.  Here is how we plan to help!  Based on what you have shared with me it looks like you have sinusitis.  Sinusitis is inflammation and infection in the sinus cavities of the head.  Based on your presentation I believe you most likely have Acute Viral Sinusitis.This is an infection most likely caused by a virus. There is not specific treatment for viral sinusitis other than to help you with the symptoms until the infection runs its course.  You may use an oral decongestant such as Mucinex D or if you have glaucoma or high blood pressure use plain Mucinex. Saline nasal spray help and can safely be used as often as needed for congestion, I have prescribed: Ipratropium Bromide nasal spray 0.03% 2 sprays in eah nostril 2-3 times a day    Due to the increasing numbers of COVID in our community, I also strongly recommend that you get tested for COVID.  Some authorities believe that zinc sprays or the use of Echinacea may shorten the course of your symptoms.  Sinus infections are not as easily transmitted as other respiratory infection, however we still recommend that you avoid close contact with loved ones, especially the very young and elderly.  Remember to wash your hands thoroughly throughout the day as this is the number one way to prevent the spread of infection!  Home Care:  Only take medications as instructed by your medical team.  Do not take these medications with alcohol.  A steam or ultrasonic humidifier can help congestion.  You can place a towel over your head and breathe in the steam from hot water coming from a faucet.  Avoid close contacts especially the very young and the elderly.  Cover your mouth when you cough or sneeze.  Always remember to wash your hands.  Get Help Right Away If:  You develop worsening fever or sinus pain.  You develop a severe head ache or visual changes.  Your symptoms persist after you have completed your  treatment plan.  Make sure you  Understand these instructions.  Will watch your condition.  Will get help right away if you are not doing well or get worse.  Your e-visit answers were reviewed by a board certified advanced clinical practitioner to complete your personal care plan.  Depending on the condition, your plan could have included both over the counter or prescription medications.  If there is a problem please reply  once you have received a response from your provider.  Your safety is important to [place].  If you have drug allergies check your prescription carefully.    You can use MyChart to ask questions about todays visit, request a non-urgent call back, or ask for a work or school excuse for 24 hours related to this e-Visit. If it has been greater than 24 hours you will need to follow up with your provider, or enter a new e-Visit to address those concerns.  You will get an e-mail in the next two days asking about your experience.  I hope that your e-visit has been valuable and will speed your recovery. Thank you for using e-visits.  Greater than 5 minutes, yet less than 10 minutes of time have been spent researching, coordinating, and implementing care for this patient today.  Jarold Motto PA-C

## 2020-07-24 ENCOUNTER — Ambulatory Visit: Payer: Self-pay | Admitting: Nurse Practitioner

## 2020-07-28 ENCOUNTER — Encounter: Payer: Self-pay | Admitting: Nurse Practitioner

## 2020-07-28 ENCOUNTER — Ambulatory Visit (INDEPENDENT_AMBULATORY_CARE_PROVIDER_SITE_OTHER): Payer: Medicare HMO | Admitting: Nurse Practitioner

## 2020-07-28 ENCOUNTER — Other Ambulatory Visit: Payer: Self-pay

## 2020-07-28 ENCOUNTER — Ambulatory Visit: Payer: Self-pay | Admitting: Nurse Practitioner

## 2020-07-28 VITALS — BP 143/86 | HR 94 | Temp 96.8°F | Resp 20 | Ht 63.0 in | Wt 120.0 lb

## 2020-07-28 DIAGNOSIS — F411 Generalized anxiety disorder: Secondary | ICD-10-CM

## 2020-07-28 DIAGNOSIS — K219 Gastro-esophageal reflux disease without esophagitis: Secondary | ICD-10-CM

## 2020-07-28 DIAGNOSIS — R946 Abnormal results of thyroid function studies: Secondary | ICD-10-CM

## 2020-07-28 DIAGNOSIS — M810 Age-related osteoporosis without current pathological fracture: Secondary | ICD-10-CM | POA: Diagnosis not present

## 2020-07-28 DIAGNOSIS — E782 Mixed hyperlipidemia: Secondary | ICD-10-CM | POA: Diagnosis not present

## 2020-07-28 MED ORDER — SIMVASTATIN 40 MG PO TABS: 40.0000 mg | ORAL_TABLET | Freq: Every day | ORAL | 1 refills | Status: DC

## 2020-07-28 MED ORDER — ALPRAZOLAM 0.25 MG PO TABS: 0.2500 mg | ORAL_TABLET | Freq: Two times a day (BID) | ORAL | 2 refills | Status: DC | PRN

## 2020-07-28 MED ORDER — PANTOPRAZOLE SODIUM 40 MG PO TBEC: 40.0000 mg | DELAYED_RELEASE_TABLET | Freq: Every day | ORAL | 1 refills | Status: DC

## 2020-07-28 NOTE — Addendum Note (Signed)
Addended by: Cleda Daub on: 07/28/2020 04:26 PM   Modules accepted: Orders

## 2020-07-28 NOTE — Patient Instructions (Signed)
Exercising to Stay Healthy To become healthy and stay healthy, it is recommended that you do moderate-intensity and vigorous-intensity exercise. You can tell that you are exercising at a moderate intensity if your heart starts beating faster and you start breathing faster but can still hold a conversation. You can tell that you are exercising at a vigorous intensity if you are breathing much harder and faster and cannot hold a conversation while exercising. Exercising regularly is important. It has many health benefits, such as:  Improving overall fitness, flexibility, and endurance.  Increasing bone density.  Helping with weight control.  Decreasing body fat.  Increasing muscle strength.  Reducing stress and tension.  Improving overall health. How often should I exercise? Choose an activity that you enjoy, and set realistic goals. Your health care provider can help you make an activity plan that works for you. Exercise regularly as told by your health care provider. This may include:  Doing strength training two times a week, such as: ? Lifting weights. ? Using resistance bands. ? Push-ups. ? Sit-ups. ? Yoga.  Doing a certain intensity of exercise for a given amount of time. Choose from these options: ? A total of 150 minutes of moderate-intensity exercise every week. ? A total of 75 minutes of vigorous-intensity exercise every week. ? A mix of moderate-intensity and vigorous-intensity exercise every week. Children, pregnant women, people who have not exercised regularly, people who are overweight, and older adults may need to talk with a health care provider about what activities are safe to do. If you have a medical condition, be sure to talk with your health care provider before you start a new exercise program. What are some exercise ideas? Moderate-intensity exercise ideas include:  Walking 1 mile (1.6 km) in about 15  minutes.  Biking.  Hiking.  Golfing.  Dancing.  Water aerobics. Vigorous-intensity exercise ideas include:  Walking 4.5 miles (7.2 km) or more in about 1 hour.  Jogging or running 5 miles (8 km) in about 1 hour.  Biking 10 miles (16.1 km) or more in about 1 hour.  Lap swimming.  Roller-skating or in-line skating.  Cross-country skiing.  Vigorous competitive sports, such as football, basketball, and soccer.  Jumping rope.  Aerobic dancing.   What are some everyday activities that can help me to get exercise?  Yard work, such as: ? Pushing a lawn mower. ? Raking and bagging leaves.  Washing your car.  Pushing a stroller.  Shoveling snow.  Gardening.  Washing windows or floors. How can I be more active in my day-to-day activities?  Use stairs instead of an elevator.  Take a walk during your lunch break.  If you drive, park your car farther away from your work or school.  If you take public transportation, get off one stop early and walk the rest of the way.  Stand up or walk around during all of your indoor phone calls.  Get up, stretch, and walk around every 30 minutes throughout the day.  Enjoy exercise with a friend. Support to continue exercising will help you keep a regular routine of activity. What guidelines can I follow while exercising?  Before you start a new exercise program, talk with your health care provider.  Do not exercise so much that you hurt yourself, feel dizzy, or get very short of breath.  Wear comfortable clothes and wear shoes with good support.  Drink plenty of water while you exercise to prevent dehydration or heat stroke.  Work out until   your breathing and your heartbeat get faster. Where to find more information  [place] Department of Health and Human Services: www.hhs.gov  Centers for Disease Control and Prevention (CDC): www.cdc.gov Summary  Exercising regularly is important. It will improve your overall fitness,  flexibility, and endurance.  Regular exercise also will improve your overall health. It can help you control your weight, reduce stress, and improve your bone density.  Do not exercise so much that you hurt yourself, feel dizzy, or get very short of breath.  Before you start a new exercise program, talk with your health care provider. This information is not intended to replace advice given to you by your health care provider. Make sure you discuss any questions you have with your health care provider. Document Revised: 05/20/2017 Document Reviewed: 04/28/2017 Elsevier Patient Education  2021 Elsevier Inc.  

## 2020-07-28 NOTE — Progress Notes (Signed)
Subjective:    Patient ID: Courtney Johnson, female    DOB: [birth year], 75 y.o.   MRN: [id number]   Chief Complaint: Pt presents today for a routine follow up on chronic disease management.    HPI:  1. Mixed hyperlipidemia Following fat diet, watches what she eats except for the holiday season. Taking Simvastatin with no side effects.   Lab Results  Component Value Date   CHOL 161 01/21/2020   HDL 60 01/21/2020   LDLCALC 81 01/21/2020   TRIG 114 01/21/2020   CHOLHDL 2.7 01/21/2020   The 10-year ASCVD risk score Denman George DC Jr., et al., 2013) is: 15.3%  2. Gastroesophageal reflux disease, unspecified whether esophagitis present Low acidic food diet; does not go to bed with full stomach; still taking pantoprazole and is working well.   3. Generalized anxiety disorder  Continues to have periods of anxiety. Still taking Xanax when needed. Does deep breathing exercises. Feeling anxious about her chest burning.   GAD 7 : Generalized Anxiety Score 07/28/2020 01/21/2020 07/23/2019 12/05/2017  Nervous, Anxious, on Edge 1 1 0 0  Control/stop worrying 0 0 0 0  Worry too much - different things 0 1 0 0  Trouble relaxing 1 0 0 1  Restless 0 0 0 0  Easily annoyed or irritable 0 0 0 0  Afraid - awful might happen 0 0 0 0  Total GAD 7 Score 2 2 0 1  Anxiety Difficulty Not difficult at all Not difficult at all Not difficult at all Not difficult at all       4. Age-related osteoporosis without current pathological fracture T-Score of -2.7 on 12/05/17. Due for repeat this year. Eats dairy for calcium. Drinks 2%. Takes cholecalciferol tabs daily.    Outpatient Encounter Medications as of 07/28/2020  Medication Sig  . alendronate (FOSAMAX) 70 MG tablet TAKE 1 TABLET BY MOUTH ONCE A WEEK WITH  A  FULL  GLASS  OF  WATER  ON  AN  EMPTY  STOMACH  . ALPRAZolam (XANAX) 0.25 MG tablet Take 1 tablet (0.25 mg total) by mouth 2 (two) times daily as needed for sleep.  . AMBULATORY NON FORMULARY MEDICATION  Nitroglycerine ointment 0.125 %  Apply a pea sized amount internally four times daily. Dispense 30 GM zero refill  . Cholecalciferol (VITAMIN D) 50 MCG (2000 UT) tablet Take 2,000 Units by mouth daily.  . fluticasone (FLONASE) 50 MCG/ACT nasal spray Place 1 spray into both nostrils 2 (two) times daily as needed for allergies or rhinitis.  . hydrocortisone (ANUSOL-HC) 2.5 % rectal cream Use twice daily for 14 days (Patient not taking: Reported on 05/23/2020)  . ipratropium (ATROVENT) 0.03 % nasal spray Place 2 sprays into both nostrils every 12 (twelve) hours.  . pantoprazole (PROTONIX) 40 MG tablet Take 1 tablet (40 mg total) by mouth daily.  . phenylephrine (,USE FOR PREPARATION-H,) 0.25 % suppository Insert 1 suppository twice daily for 14 days (Patient not taking: Reported on 01/21/2020)  . simvastatin (ZOCOR) 40 MG tablet Take 1 tablet (40 mg total) by mouth daily.  Marland Kitchen sulfamethoxazole-trimethoprim (BACTRIM DS) 800-160 MG tablet Take 1 tablet by mouth 2 (two) times daily.   No facility-administered encounter medications on file as of 07/28/2020.    Past Surgical History:  Procedure Laterality Date  . ABDOMINAL HYSTERECTOMY     partial   . COLONOSCOPY    . EYE SURGERY     cataracts removed - bilateral     Family History  Problem Relation Age of Onset  . Other Mother        age and frailty   . Heart disease Father        by-pass  . Heart attack Brother        54   . Heart disease Brother   . Diabetes Daughter   . Colon cancer Sister   . Cancer Sister        brain tumor - 36  . Heart attack Son        at age 31  . Esophageal cancer Neg Hx   . Rectal cancer Neg Hx   . Stomach cancer Neg Hx   . Liver cancer Neg Hx     New complaints: Chest burning that radiates to left shoulder. Mid sternal pain worse in the mornings. Pain comes and goes while doing house work. Shoulder pain worse at night. Some mild SOB when going up stairs.   Social history: Lives alone. No problem with  affording medications.   Controlled substance contract: n/a    Review of Systems  Constitutional: Negative for appetite change, fatigue and fever.  HENT: Negative for ear pain, hearing loss and sore throat.   Respiratory: Positive for chest tightness. Negative for cough and wheezing.   Cardiovascular: Positive for chest pain. Negative for palpitations and leg swelling.  Gastrointestinal: Negative for abdominal pain, constipation and diarrhea.  Genitourinary: Negative for difficulty urinating and dysuria.  Musculoskeletal: Negative for arthralgias and joint swelling.  Neurological: Negative for dizziness, syncope and headaches.  Psychiatric/Behavioral: Negative for sleep disturbance. The patient is not nervous/anxious.        Wakes frequently through the night.       Objective:   Physical Exam Constitutional:      Appearance: Normal appearance. She is normal weight.  HENT:     Head: Normocephalic.  Neck:     Vascular: No carotid bruit.  Cardiovascular:     Rate and Rhythm: Normal rate and regular rhythm.     Pulses: Normal pulses.     Heart sounds: Normal heart sounds.  Pulmonary:     Effort: Pulmonary effort is normal.     Breath sounds: Normal breath sounds.  Abdominal:     General: Abdomen is flat. Bowel sounds are normal.     Palpations: Abdomen is soft.  Musculoskeletal:     Cervical back: Normal range of motion and neck supple.  Skin:    General: Skin is warm and dry.     Capillary Refill: Capillary refill takes less than 2 seconds.  Neurological:     General: No focal deficit present.     Mental Status: She is alert and oriented to person, place, and time.  Psychiatric:        Mood and Affect: Mood normal.        Behavior: Behavior normal.        Thought Content: Thought content normal.        Judgment: Judgment normal.    Vitals:   07/28/20 1524  BP: (!) 143/86  Pulse: 94  Resp: 20  Temp: (!) 96.8 F (36 C)  SpO2: 99%   EKG- NSR-Preliminary reading  by Paulene Floor, FNP  [facility]        Assessment & Plan:  [name] comes in today with chief complaint of Medical Management of Chronic Issues   Diagnosis and orders addressed:  1. Mixed hyperlipidemia Low fat diet - EKG 12-Lead - simvastatin (ZOCOR) 40 MG tablet;  Take 1 tablet (40 mg total) by mouth daily.  Dispense: 90 tablet; Refill: 1   2. Gastroesophageal reflux disease, unspecified whether esophagitis present Avoid spicy foods Do not eat 2 hours prior to bedtime - pantoprazole (PROTONIX) 40 MG tablet; Take 1 tablet (40 mg total) by mouth daily.  Dispense: 90 tablet; Refill: 1  3. Generalized anxiety disorder Stress management - ALPRAZolam (XANAX) 0.25 MG tablet; Take 1 tablet (0.25 mg total) by mouth 2 (two) times daily as needed for sleep.  Dispense: 60 tablet; Refill: 2 - Ambulatory referral to Cardiology  4. Age-related osteoporosis without current pathological fracture Weight bering exercises - DG [facility] DEXA    Labs pending Health Maintenance reviewed Diet and exercise encouraged  Follow up plan: 6 months   [name], FNP

## 2020-07-29 LAB — CBC WITH DIFFERENTIAL/PLATELET
Basophils Absolute: 0 10*3/uL (ref 0.0–0.2)
Basos: 1 %
EOS (ABSOLUTE): 0.1 10*3/uL (ref 0.0–0.4)
Eos: 1 %
Hematocrit: 38 % (ref 34.0–46.6)
Hemoglobin: 13 g/dL (ref 11.1–15.9)
Immature Grans (Abs): 0 10*3/uL (ref 0.0–0.1)
Immature Granulocytes: 0 %
Lymphocytes Absolute: 2.2 10*3/uL (ref 0.7–3.1)
Lymphs: 33 %
MCH: 31.3 pg (ref 26.6–33.0)
MCHC: 34.2 g/dL (ref 31.5–35.7)
MCV: 91 fL (ref 79–97)
Monocytes Absolute: 0.4 10*3/uL (ref 0.1–0.9)
Monocytes: 6 %
Neutrophils Absolute: 3.9 10*3/uL (ref 1.4–7.0)
Neutrophils: 59 %
Platelets: 301 10*3/uL (ref 150–450)
RBC: 4.16 x10E6/uL (ref 3.77–5.28)
RDW: 12.3 % (ref 11.7–15.4)
WBC: 6.6 10*3/uL (ref 3.4–10.8)

## 2020-07-29 LAB — CMP14+EGFR
ALT: 35 IU/L — ABNORMAL HIGH (ref 0–32)
AST: 33 IU/L (ref 0–40)
Albumin/Globulin Ratio: 2.1 (ref 1.2–2.2)
Albumin: 4.5 g/dL (ref 3.7–4.7)
Alkaline Phosphatase: 53 IU/L (ref 44–121)
BUN/Creatinine Ratio: 12 (ref 12–28)
BUN: 9 mg/dL (ref 8–27)
Bilirubin Total: 0.3 mg/dL (ref 0.0–1.2)
CO2: 24 mmol/L (ref 20–29)
Calcium: 9.3 mg/dL (ref 8.7–10.3)
Chloride: 106 mmol/L (ref 96–106)
Creatinine, Ser: 0.75 mg/dL (ref 0.57–1.00)
GFR calc Af Amer: 91 mL/min/{1.73_m2} (ref >59)
GFR calc non Af Amer: 79 mL/min/{1.73_m2} (ref >59)
Globulin, Total: 2.1 g/dL (ref 1.5–4.5)
Glucose: 89 mg/dL (ref 65–99)
Potassium: 3.6 mmol/L (ref 3.5–5.2)
Sodium: 145 mmol/L — ABNORMAL HIGH (ref 134–144)
Total Protein: 6.6 g/dL (ref 6.0–8.5)

## 2020-07-29 LAB — LIPID PANEL
Chol/HDL Ratio: 2.7 ratio (ref 0.0–4.4)
Cholesterol, Total: 174 mg/dL (ref 100–199)
HDL: 64 mg/dL (ref >39)
LDL Chol Calc (NIH): 86 mg/dL (ref 0–99)
Triglycerides: 137 mg/dL (ref 0–149)
VLDL Cholesterol Cal: 24 mg/dL (ref 5–40)

## 2020-07-29 LAB — THYROID PANEL WITH TSH
Free Thyroxine Index: 2.1 (ref 1.2–4.9)
T3 Uptake Ratio: 23 % — ABNORMAL LOW (ref 24–39)
T4, Total: 9 ug/dL (ref 4.5–12.0)
TSH: 1.84 u[IU]/mL (ref 0.450–4.500)

## 2020-08-16 NOTE — Progress Notes (Signed)
Cardiology Office Note:    Date:  08/18/2020   ID:  Courtney Johnson, DOB September 27, 1945, MRN [id number]  PCP:  Bennie Pierini, FNP   [facility]  Cardiologist:  No primary care provider on file.  Advanced Practice Provider:  No care team member to display Electrophysiologist:  None   Referring MD: Bennie Pierini, *    History of Present Illness:    Courtney Johnson is a 75 y.o. female with a hx of HTN, HLD, GERD, and anxiety who was referred by Paulene Floor, FNP for further evaluation of chest pain.  Patient states that she has been having intermittent burning in her chest and left shoulder. Has been ongoing for the past several weeks. Symptoms can come on at any time. Not exertional related, but does note SOB with exertion. Initially thought it may be related to GERD, however, her PPI did not help alleviate the pain. Does not know what triggers symptoms and no known alleviating factors. Has occasional palpitations. No orthopnea, LE edema, lightheadedness, dizziness. Patient is overall active with no decreased exercise capacity. Blood pressure well controlled at home and always high in the doctors office. Has very strong history of CAD as detailed below.  Family History: Sister with MI, mother with CABG, father with CAGB, sister with CABG, sons with MI.   Past Medical History:  Diagnosis Date  . Allergy   . Anxiety    not much sleep / stress   . Arthritis   . Cataract   . GERD (gastroesophageal reflux disease)   . Hyperlipidemia   . Hypertension    elevated at Dr Isidore Moos = not high at home   . Osteoporosis     Past Surgical History:  Procedure Laterality Date  . ABDOMINAL HYSTERECTOMY     partial   . COLONOSCOPY    . EYE SURGERY     cataracts removed - bilateral     Current Medications: Current Meds  Medication Sig  . alendronate (FOSAMAX) 70 MG tablet TAKE 1 TABLET BY MOUTH ONCE A WEEK WITH  A  FULL  GLASS  OF  WATER  ON  AN  EMPTY   STOMACH  . ALPRAZolam (XANAX) 0.25 MG tablet Take 1 tablet (0.25 mg total) by mouth 2 (two) times daily as needed for sleep.  Marland Kitchen aspirin EC 81 MG tablet Take 1 tablet (81 mg total) by mouth daily. Swallow whole.  . Cholecalciferol (VITAMIN D) 50 MCG (2000 UT) tablet Take 2,000 Units by mouth daily.  . metoprolol tartrate (LOPRESSOR) 100 MG tablet TAKE 1 TABLET BY MOUTH 2 HOURS PRIOR TO YOUR CARDIAC CT  . pantoprazole (PROTONIX) 40 MG tablet Take 1 tablet (40 mg total) by mouth daily.  . phenylephrine (,USE FOR PREPARATION-H,) 0.25 % suppository Place 1 suppository rectally as needed for hemorrhoids.  . simvastatin (ZOCOR) 40 MG tablet Take 1 tablet (40 mg total) by mouth daily.     Allergies:   Patient has no known allergies.   Social History   Socioeconomic History  . Marital status: Widowed    Spouse name: Not on file  . Number of children: 3  . Years of education: Not on file  . Highest education level: Not on file  Occupational History  . Occupation: Realtor  Tobacco Use  . Smoking status: Never Smoker  . Smokeless tobacco: Never Used  Vaping Use  . Vaping Use: Never used  Substance and Sexual Activity  . Alcohol use: No  . Drug  use: No  . Sexual activity: Not on file  Other Topics Concern  . Not on file  Social History Narrative   Widowed 2020   3 grown children    Realtor   No alcohol never smoker never drug user   Social Determinants of Corporate investment banker Strain: Not on file  Food Insecurity: Not on file  Transportation Needs: Not on file  Physical Activity: Not on file  Stress: Not on file  Social Connections: Not on file     Family History: The patient's family history includes Cancer in her sister; Colon cancer in her sister; Diabetes in her daughter; Heart attack in her brother and son; Heart disease in her brother and father; Other in her mother. There is no history of Esophageal cancer, Rectal cancer, Stomach cancer, or Liver cancer.  ROS:    Please see the history of present illness.    Review of Systems  Constitutional: Negative for chills, fever and malaise/fatigue.  HENT: Negative for sore throat.   Eyes: Negative for blurred vision and redness.  Respiratory: Positive for shortness of breath.   Cardiovascular: Positive for chest pain and palpitations. Negative for orthopnea, claudication, leg swelling and PND.  Gastrointestinal: Negative for melena, nausea and vomiting.  Genitourinary: Negative for dysuria.  Musculoskeletal: Negative for falls.  Neurological: Negative for dizziness and loss of consciousness.  Endo/Heme/Allergies: Negative for polydipsia.  Psychiatric/Behavioral: Negative for substance abuse.    EKGs/Labs/Other Studies Reviewed:    The following studies were reviewed today: No cardiac studies  EKG:  EKG 07/28/20: NSR with no ischemia or block.  Recent Labs: 07/28/2020: ALT 35; BUN 9; Creatinine, Ser 0.75; Hemoglobin 13.0; Platelets 301; Potassium 3.6; Sodium 145; TSH 1.840  Recent Lipid Panel    Component Value Date/Time   CHOL 174 07/28/2020 1637   TRIG 137 07/28/2020 1637   TRIG 215 (H) 01/30/2014 1133   HDL 64 07/28/2020 1637   HDL 55 01/30/2014 1133   CHOLHDL 2.7 07/28/2020 1637   LDLCALC 86 07/28/2020 1637   LDLCALC 194 (H) 01/30/2014 1133     Risk Assessment/Calculations:       Physical Exam:    VS:  BP 140/82   Pulse 67   Ht 5\' 3"  (1.6 m)   Wt 124 lb 3.2 oz (56.3 kg)   SpO2 97%   BMI 22.00 kg/m     Wt Readings from Last 3 Encounters:  08/18/20 124 lb 3.2 oz (56.3 kg)  07/28/20 120 lb (54.4 kg)  05/23/20 123 lb 3.2 oz (55.9 kg)     GEN:  Well nourished, well developed in no acute distress HEENT: Normal NECK: No JVD; No carotid bruits CARDIAC: RRR, no murmurs, rubs, gallops RESPIRATORY:  Clear to auscultation without rales, wheezing or rhonchi  ABDOMEN: Soft, non-tender, non-distended MUSCULOSKELETAL:  No edema; No deformity  SKIN: Warm and dry NEUROLOGIC:  Alert and  oriented x 3 PSYCHIATRIC:  Normal affect   ASSESSMENT:    1. Chest pain due to myocardial ischemia, unspecified ischemic chest pain type   2. Mixed hyperlipidemia   3. Primary hypertension    PLAN:    In order of problems listed above:  #Chest Pain: Patient with intermittent chest burning that has been ongoing for several weeks. Pain is atypical and not exertional related, however, symptoms have been progressing and not responding to PPI. Notably, has a very strong family history of premature CAD. Will perform ischemic work-up. -Coronary CTA -TTE -Start ASA 81mg  daily and can  stop if CTA reassuring -Continue simvastatin 40mg  daily  #HTN: Well controlled at home off medications. -Continue to monitor at home  #HLD: -Continue simvastatin 40mg  daily and adjust as needed pending coronary CTA       Medication Adjustments/Labs and Tests Ordered: Current medicines are reviewed at length with the patient today.  Concerns regarding medicines are outlined above.  Orders Placed This Encounter  Procedures  . CT CORONARY MORPH W/CTA COR W/SCORE W/CA W/CM &/OR WO/CM  . CT CORONARY FRACTIONAL FLOW RESERVE DATA PREP  . CT CORONARY FRACTIONAL FLOW RESERVE FLUID ANALYSIS  . Basic metabolic panel  . ECHOCARDIOGRAM COMPLETE   Meds ordered this encounter  Medications  . metoprolol tartrate (LOPRESSOR) 100 MG tablet    Sig: TAKE 1 TABLET BY MOUTH 2 HOURS PRIOR TO YOUR CARDIAC CT    Dispense:  1 tablet    Refill:  0  . aspirin EC 81 MG tablet    Sig: Take 1 tablet (81 mg total) by mouth daily. Swallow whole.    Dispense:  90 tablet    Refill:  3    Patient Instructions   Medication Instructions:  Your physician has recommended you make the following change in your medication:  1.  START Aspirin 81 mg taking 1 daily  *If you need a refill on your cardiac medications before your next appointment, please call your pharmacy*   Lab Work: When they schedule your Cardiac CT, they will  let you know when to come in for :  BMET  If you have labs (blood work) drawn today and your tests are completely normal, you will receive your results only by: MyChart Message (if you have MyChart) OR . A paper copy in the mail If you have any lab test that is abnormal or we need to change your treatment, we will call you to review the results.   Testing/Procedures: Your physician has requested that you have an echocardiogram. Echocardiography is a painless test that uses sound waves to create images of your heart. It provides your doctor with information about the size and shape of your heart and how well your heart's chambers and valves are working. This procedure takes approximately one hour. There are no restrictions for this procedure.   Your physician has requested that you have cardiac CT. Cardiac computed tomography (CT) is a painless test that uses an x-ray machine to take clear, detailed pictures of your heart. For further information please visit . Please follow instruction sheet BELOW:  Your cardiac CT will be scheduled at one of the below locations:   [facility] [address] [place], [address] [place] [phone]  Please arrive at the [facility] main entrance (entrance A) of [facility] 30 minutes prior to test start time. Proceed to the [facility] Radiology Department (first floor) to check-in and test prep.  If scheduled at [facility], please arrive 15 mins early for check-in and test prep.  Please follow these instructions carefully (unless otherwise directed):  Hold all erectile dysfunction medications at least 3 days (72 hrs) prior to test.  On the Night Before the Test: . Be sure to Drink plenty of water. . Do not consume any caffeinated/decaffeinated beverages or chocolate 12 hours prior to your test. . Do not take any antihistamines 12 hours prior to your test.   On the Day of the  Test: . Drink plenty of water until 1 hour prior to the test. . Do  not eat any food 4 hours prior to the test. . You may take your regular medications prior to the test.  . Take metoprolol (Lopressor) 100 two hours prior to test.([facility] . FEMALES- please wear underwire-free bra if available       After the Test: . Drink plenty of water. . After receiving IV contrast, you may experience a mild flushed feeling. This is normal. . On occasion, you may experience a mild rash up to 24 hours after the test. This is not dangerous. If this occurs, you can take Benadryl 25 mg and increase your fluid intake. . If you experience trouble breathing, this can be serious. If it is severe call 911 IMMEDIATELY. If it is mild, please call our office.   Once we have confirmed authorization from your insurance company, we will call you to set up a date and time for your test. Based on how quickly your insurance processes prior authorizations requests, please allow up to 4 weeks to be contacted for scheduling your Cardiac CT appointment. Be advised that routine Cardiac CT appointments could be scheduled as many as 8 weeks after your provider has ordered it.  For non-scheduling related questions, please contact the cardiac imaging nurse navigator should you have any questions/concerns: Rockwell AlexandriaSara Wallace, Cardiac Imaging Nurse Navigator Larey BrickMerle Prescott, Cardiac Imaging Nurse Navigator [facility] Direct Office Dial: [phone]   For scheduling needs, including cancellations and rescheduling, please call [place], [phone].   Follow-Up: At [facility], you and your health needs are our priority.  As part of our continuing mission to provide you with exceptional heart care, we have created designated Provider Care Teams.  These Care Teams include your primary Cardiologist (physician) and Advanced Practice Providers (APPs -  Physician Assistants and Nurse  Practitioners) who all work together to provide you with the care you need, when you need it.  We recommend signing up for the patient portal called "MyChart".  Sign up information is provided on this After Visit Summary.  MyChart is used to connect with patients for Virtual Visits (Telemedicine).  Patients are able to view lab/test results, encounter notes, upcoming appointments, etc.  Non-urgent messages can be sent to your provider as well.   To learn more about what you can do with MyChart, go to [email or web address].    Your next appointment:   3 month(s)  The format for your next appointment:   In Person  Provider:   Laurance FlattenHeather Danyetta Gillham, MD   Other Instructions  Echocardiogram An echocardiogram is a test that uses sound waves (ultrasound) to produce images of the heart. Images from an echocardiogram can provide important information about:  Heart size and shape.  The size and thickness and movement of your heart's walls.  Heart muscle function and strength.  Heart valve function or if you have stenosis. Stenosis is when the heart valves are too narrow.  If blood is flowing backward through the heart valves (regurgitation).  A tumor or infectious growth around the heart valves.  Areas of heart muscle that are not working well because of poor blood flow or injury from a heart attack.  Aneurysm detection. An aneurysm is a weak or damaged part of an artery wall. The wall bulges out from the normal force of blood pumping through the body. Tell a health care provider about:  Any allergies you have.  All medicines you are taking, including vitamins, herbs, eye drops, creams, and over-the-counter medicines.  Any  blood disorders you have.  Any surgeries you have had.  Any medical conditions you have.  Whether you are pregnant or may be pregnant. What are the risks? Generally, this is a safe test. However, problems may occur, including an allergic reaction to dye  (contrast) that may be used during the test. What happens before the test? No specific preparation is needed. You may eat and drink normally. What happens during the test?  You will take off your clothes from the waist up and put on a hospital gown.  Electrodes or electrocardiogram (ECG)patches may be placed on your chest. The electrodes or patches are then connected to a device that monitors your heart rate and rhythm.  You will lie down on a table for an ultrasound exam. A gel will be applied to your chest to help sound waves pass through your skin.  A handheld device, called a transducer, will be pressed against your chest and moved over your heart. The transducer produces sound waves that travel to your heart and bounce back (or "echo" back) to the transducer. These sound waves will be captured in real-time and changed into images of your heart that can be viewed on a video monitor. The images will be recorded on a computer and reviewed by your health care provider.  You may be asked to change positions or hold your breath for a short time. This makes it easier to get different views or better views of your heart.  In some cases, you may receive contrast through an IV in one of your veins. This can improve the quality of the pictures from your heart. The procedure may vary among health care providers and hospitals.   What can I expect after the test? You may return to your normal, everyday life, including diet, activities, and medicines, unless your health care provider tells you not to do that. Follow these instructions at home:  It is up to you to get the results of your test. Ask your health care provider, or the department that is doing the test, when your results will be ready.  Keep all follow-up visits. This is important. Summary  An echocardiogram is a test that uses sound waves (ultrasound) to produce images of the heart.  Images from an echocardiogram can provide important  information about the size and shape of your heart, heart muscle function, heart valve function, and other possible heart problems.  You do not need to do anything to prepare before this test. You may eat and drink normally.  After the echocardiogram is completed, you may return to your normal, everyday life, unless your health care provider tells you not to do that. This information is not intended to replace advice given to you by your health care provider. Make sure you discuss any questions you have with your health care provider. Document Revised: 01/29/2020 Document Reviewed: 01/29/2020 Elsevier Patient Education  2021 [employer].      Signed, Meriam Sprague, MD  08/18/2020 12:57 PM    [facility]

## 2020-08-18 ENCOUNTER — Encounter: Payer: Self-pay | Admitting: Cardiology

## 2020-08-18 ENCOUNTER — Ambulatory Visit: Payer: Medicare HMO | Admitting: Cardiology

## 2020-08-18 ENCOUNTER — Other Ambulatory Visit: Payer: Self-pay

## 2020-08-18 VITALS — BP 140/82 | HR 67 | Ht 63.0 in | Wt 124.2 lb

## 2020-08-18 DIAGNOSIS — E782 Mixed hyperlipidemia: Secondary | ICD-10-CM | POA: Diagnosis not present

## 2020-08-18 DIAGNOSIS — I10 Essential (primary) hypertension: Secondary | ICD-10-CM | POA: Diagnosis not present

## 2020-08-18 DIAGNOSIS — I259 Chronic ischemic heart disease, unspecified: Secondary | ICD-10-CM | POA: Diagnosis not present

## 2020-08-18 MED ORDER — METOPROLOL TARTRATE 100 MG PO TABS: ORAL_TABLET | ORAL | 0 refills | Status: DC

## 2020-08-18 MED ORDER — ASPIRIN EC 81 MG PO TBEC: 81.0000 mg | DELAYED_RELEASE_TABLET | Freq: Every day | ORAL | 3 refills | Status: DC

## 2020-08-18 NOTE — Patient Instructions (Addendum)
Medication Instructions:  Your physician has recommended you make the following change in your medication:  1.  START Aspirin 81 mg taking 1 daily  *If you need a refill on your cardiac medications before your next appointment, please call your pharmacy*   Lab Work: When they schedule your Cardiac CT, they will let you know when to come in for :  BMET  If you have labs (blood work) drawn today and your tests are completely normal, you will receive your results only by: Marland Kitchen MyChart Message (if you have MyChart) OR . A paper copy in the mail If you have any lab test that is abnormal or we need to change your treatment, we will call you to review the results.   Testing/Procedures: Your physician has requested that you have an echocardiogram. Echocardiography is a painless test that uses sound waves to create images of your heart. It provides your doctor with information about the size and shape of your heart and how well your heart's chambers and valves are working. This procedure takes approximately one hour. There are no restrictions for this procedure.   Your physician has requested that you have cardiac CT. Cardiac computed tomography (CT) is a painless test that uses an x-ray machine to take clear, detailed pictures of your heart. For further information please visit [email or web address] Please follow instruction sheet BELOW:  Your cardiac CT will be scheduled at one of the below locations:   [facility] [address] [place], [place] [address] [phone]  Please arrive at the [facility] main entrance (entrance A) of [facility] 30 minutes prior to test start time. Proceed to the [facility] Radiology Department (first floor) to check-in and test prep.  If scheduled at [facility], please arrive 15 mins early for check-in and test prep.  Please follow these instructions carefully (unless otherwise directed):  Hold all erectile  dysfunction medications at least 3 days (72 hrs) prior to test.  On the Night Before the Test: . Be sure to Drink plenty of water. . Do not consume any caffeinated/decaffeinated beverages or chocolate 12 hours prior to your test. . Do not take any antihistamines 12 hours prior to your test.   On the Day of the Test: . Drink plenty of water until 1 hour prior to the test. . Do not eat any food 4 hours prior to the test. . You may take your regular medications prior to the test.  . Take metoprolol (Lopressor) 100 two hours prior to test.(this has been sent to [facility]) . FEMALES- please wear underwire-free bra if available       After the Test: . Drink plenty of water. . After receiving IV contrast, you may experience a mild flushed feeling. This is normal. . On occasion, you may experience a mild rash up to 24 hours after the test. This is not dangerous. If this occurs, you can take Benadryl 25 mg and increase your fluid intake. . If you experience trouble breathing, this can be serious. If it is severe call 911 IMMEDIATELY. If it is mild, please call our office.   Once we have confirmed authorization from your insurance company, we will call you to set up a date and time for your test. Based on how quickly your insurance processes prior authorizations requests, please allow up to 4 weeks to be contacted for scheduling your Cardiac CT appointment. Be advised that routine Cardiac CT appointments could be scheduled as many as  8 weeks after your provider has ordered it.  For non-scheduling related questions, please contact the cardiac imaging nurse navigator should you have any questions/concerns: Rockwell Alexandria, Cardiac Imaging Nurse Navigator Larey Brick, Cardiac Imaging Nurse Navigator [facility] Direct Office Dial: [phone]   For scheduling needs, including cancellations and rescheduling, please call [place], [phone].   Follow-Up: At [facility], you and your health needs are our priority.  As part of our continuing mission to provide you with exceptional heart care, we have created designated Provider Care Teams.  These Care Teams include your primary Cardiologist (physician) and Advanced Practice Providers (APPs -  Physician Assistants and Nurse Practitioners) who all work together to provide you with the care you need, when you need it.  We recommend signing up for the patient portal called "MyChart".  Sign up information is provided on this After Visit Summary.  MyChart is used to connect with patients for Virtual Visits (Telemedicine).  Patients are able to view lab/test results, encounter notes, upcoming appointments, etc.  Non-urgent messages can be sent to your provider as well.   To learn more about what you can do with MyChart, go to [email or web address].    Your next appointment:   3 month(s)  The format for your next appointment:   In Person  Provider:   Laurance Flatten, MD   Other Instructions  Echocardiogram An echocardiogram is a test that uses sound waves (ultrasound) to produce images of the heart. Images from an echocardiogram can provide important information about:  Heart size and shape.  The size and thickness and movement of your heart's walls.  Heart muscle function and strength.  Heart valve function or if you have stenosis. Stenosis is when the heart valves are too narrow.  If blood is flowing backward through the heart valves (regurgitation).  A tumor or infectious growth around the heart valves.  Areas of heart muscle that are not working well because of poor blood flow or injury from a heart attack.  Aneurysm detection. An aneurysm is a weak or damaged part of an artery wall. The wall bulges out from the normal force of blood pumping through the body. Tell a health care provider about:  Any allergies you have.  All medicines you are taking, including vitamins, herbs, eye  drops, creams, and over-the-counter medicines.  Any blood disorders you have.  Any surgeries you have had.  Any medical conditions you have.  Whether you are pregnant or may be pregnant. What are the risks? Generally, this is a safe test. However, problems may occur, including an allergic reaction to dye (contrast) that may be used during the test. What happens before the test? No specific preparation is needed. You may eat and drink normally. What happens during the test?  You will take off your clothes from the waist up and put on a hospital gown.  Electrodes or electrocardiogram (ECG)patches may be placed on your chest. The electrodes or patches are then connected to a device that monitors your heart rate and rhythm.  You will lie down on a table for an ultrasound exam. A gel will be applied to your chest to help sound waves pass through your skin.  A handheld device, called a transducer, will be pressed against your chest and moved over your heart. The transducer produces sound waves that travel to your heart and bounce back (or "echo" back) to the transducer. These sound waves will be captured in real-time and changed into  images of your heart that can be viewed on a video monitor. The images will be recorded on a computer and reviewed by your health care provider.  You may be asked to change positions or hold your breath for a short time. This makes it easier to get different views or better views of your heart.  In some cases, you may receive contrast through an IV in one of your veins. This can improve the quality of the pictures from your heart. The procedure may vary among health care providers and hospitals.   What can I expect after the test? You may return to your normal, everyday life, including diet, activities, and medicines, unless your health care provider tells you not to do that. Follow these instructions at home:  It is up to you to get the results of your test.  Ask your health care provider, or the department that is doing the test, when your results will be ready.  Keep all follow-up visits. This is important. Summary  An echocardiogram is a test that uses sound waves (ultrasound) to produce images of the heart.  Images from an echocardiogram can provide important information about the size and shape of your heart, heart muscle function, heart valve function, and other possible heart problems.  You do not need to do anything to prepare before this test. You may eat and drink normally.  After the echocardiogram is completed, you may return to your normal, everyday life, unless your health care provider tells you not to do that. This information is not intended to replace advice given to you by your health care provider. Make sure you discuss any questions you have with your health care provider. Document Revised: 01/29/2020 Document Reviewed: 01/29/2020 Elsevier Patient Education  2021 [employer].

## 2020-08-26 ENCOUNTER — Ambulatory Visit (INDEPENDENT_AMBULATORY_CARE_PROVIDER_SITE_OTHER): Payer: Medicare HMO

## 2020-08-26 ENCOUNTER — Other Ambulatory Visit: Payer: Self-pay

## 2020-08-26 DIAGNOSIS — M810 Age-related osteoporosis without current pathological fracture: Secondary | ICD-10-CM | POA: Diagnosis not present

## 2020-08-28 ENCOUNTER — Telehealth (HOSPITAL_COMMUNITY): Payer: Self-pay | Admitting: Emergency Medicine

## 2020-08-28 NOTE — Telephone Encounter (Signed)
Attempted to call patient regarding upcoming cardiac CT appointment. °Left message on voicemail with name and callback number °Sarah Baez RN Navigator Cardiac Imaging °[facility] °[phone] Office °[phone] Cell ° °

## 2020-08-29 DIAGNOSIS — M85851 Other specified disorders of bone density and structure, right thigh: Secondary | ICD-10-CM | POA: Diagnosis not present

## 2020-08-29 DIAGNOSIS — M810 Age-related osteoporosis without current pathological fracture: Secondary | ICD-10-CM | POA: Diagnosis not present

## 2020-08-29 DIAGNOSIS — Z780 Asymptomatic menopausal state: Secondary | ICD-10-CM | POA: Diagnosis not present

## 2020-09-01 ENCOUNTER — Telehealth (HOSPITAL_COMMUNITY): Payer: Self-pay | Admitting: Emergency Medicine

## 2020-09-01 ENCOUNTER — Encounter (HOSPITAL_COMMUNITY): Payer: Self-pay

## 2020-09-01 ENCOUNTER — Ambulatory Visit (HOSPITAL_COMMUNITY)
Admission: RE | Admit: 2020-09-01 | Discharge: 2020-09-01 | Disposition: A | Discharge status: Home / Self Care | Payer: Medicare HMO | Source: Ambulatory Visit | Attending: Cardiology | Admitting: Cardiology

## 2020-09-01 ENCOUNTER — Other Ambulatory Visit: Payer: Self-pay

## 2020-09-01 DIAGNOSIS — I259 Chronic ischemic heart disease, unspecified: Secondary | ICD-10-CM | POA: Diagnosis not present

## 2020-09-01 MED ORDER — NITROGLYCERIN 0.4 MG SL SUBL
0.8000 mg | SUBLINGUAL_TABLET | Freq: Once | SUBLINGUAL | Status: AC
  Administered 2020-09-01: 0.8 mg via SUBLINGUAL

## 2020-09-01 MED ORDER — IOHEXOL 350 MG/ML SOLN
80.0000 mL | Freq: Once | INTRAVENOUS | Status: AC | PRN
  Administered 2020-09-01: 80 mL via INTRAVENOUS

## 2020-09-01 MED ORDER — NITROGLYCERIN 0.4 MG SL SUBL
SUBLINGUAL_TABLET | SUBLINGUAL | Status: AC
  Filled 2020-09-01: qty 2

## 2020-09-01 NOTE — Telephone Encounter (Signed)
Reaching out to patient to offer assistance regarding upcoming cardiac imaging study; pt verbalizes understanding of appt date/time, parking situation and where to check in, pre-test NPO status and medications ordered, and verified current allergies; name and call back number provided for further questions should they arise Ladye Macnaughton RN Navigator Cardiac Imaging [facility] [phone] office [phone] cell 

## 2020-09-02 ENCOUNTER — Telehealth: Payer: Self-pay

## 2020-09-02 NOTE — Telephone Encounter (Signed)
-----   Message from Meriam Sprague, MD sent at 09/02/2020  8:30 AM EDT ----- Her ct scan of her heart arteries shows just very mild plaque with no evidence of blockages. Her calcium score is only 24 which is the 46% for her age. This is great news and means her symptoms are not due to her heart arteries. She can stop the baby aspirin and just continue her simvastatin.

## 2020-09-10 ENCOUNTER — Other Ambulatory Visit: Payer: Self-pay | Admitting: *Deleted

## 2020-09-10 DIAGNOSIS — M810 Age-related osteoporosis without current pathological fracture: Secondary | ICD-10-CM

## 2020-09-10 MED ORDER — ALENDRONATE SODIUM 70 MG PO TABS: ORAL_TABLET | ORAL | 1 refills | Status: DC

## 2020-09-11 ENCOUNTER — Other Ambulatory Visit: Payer: Self-pay

## 2020-09-11 ENCOUNTER — Ambulatory Visit (HOSPITAL_COMMUNITY): Payer: Medicare HMO | Attending: Cardiovascular Disease

## 2020-09-11 DIAGNOSIS — I259 Chronic ischemic heart disease, unspecified: Secondary | ICD-10-CM | POA: Diagnosis not present

## 2020-09-11 DIAGNOSIS — E782 Mixed hyperlipidemia: Secondary | ICD-10-CM

## 2020-09-11 LAB — ECHOCARDIOGRAM COMPLETE
Area-P 1/2: 2.91 cm2
S' Lateral: 1.5 cm

## 2020-10-16 ENCOUNTER — Other Ambulatory Visit: Payer: Self-pay | Admitting: Nurse Practitioner

## 2020-10-16 DIAGNOSIS — E782 Mixed hyperlipidemia: Secondary | ICD-10-CM

## 2020-11-18 NOTE — Progress Notes (Deleted)
Cardiology Office Note:    Date:  11/18/2020   ID:  Courtney Johnson, DOB 06/16/1946, MRN [id number]  PCP:  Bennie Pierini, FNP   [facility]  Cardiologist:  None  Advanced Practice Provider:  No care team member to display Electrophysiologist:  None   Referring MD: Bennie Pierini, *    History of Present Illness:    Courtney Johnson is a 75 y.o. female with a hx of HTN, HLD, GERD, and anxiety who was referred by Paulene Floor, FNP for further evaluation of chest pain.  Patient states that she has been having intermittent burning in her chest and left shoulder. Has been ongoing for the past several weeks. Symptoms can come on at any time. Not exertional related, but does note SOB with exertion. Initially thought it may be related to GERD, however, her PPI did not help alleviate the pain. Does not know what triggers symptoms and no known alleviating factors. Has occasional palpitations. No orthopnea, LE edema, lightheadedness, dizziness. Patient is overall active with no decreased exercise capacity. Blood pressure well controlled at home and always high in the doctors office. Has very strong history of CAD as detailed below.  Family History: Sister with MI, mother with CABG, father with CAGB, sister with CABG, sons with MI.   Past Medical History:  Diagnosis Date  . Allergy   . Anxiety    not much sleep / stress   . Arthritis   . Cataract   . GERD (gastroesophageal reflux disease)   . Hyperlipidemia   . Hypertension    elevated at Dr Isidore Moos = not high at home   . Osteoporosis     Past Surgical History:  Procedure Laterality Date  . ABDOMINAL HYSTERECTOMY     partial   . COLONOSCOPY    . EYE SURGERY     cataracts removed - bilateral     Current Medications: No outpatient medications have been marked as taking for the 11/24/20 encounter (Appointment) with Meriam Sprague, MD.     Allergies:   Patient has no known allergies.    Social History   Socioeconomic History  . Marital status: Widowed    Spouse name: Not on file  . Number of children: 3  . Years of education: Not on file  . Highest education level: Not on file  Occupational History  . Occupation: Realtor  Tobacco Use  . Smoking status: Never Smoker  . Smokeless tobacco: Never Used  Vaping Use  . Vaping Use: Never used  Substance and Sexual Activity  . Alcohol use: No  . Drug use: No  . Sexual activity: Not on file  Other Topics Concern  . Not on file  Social History Narrative   Widowed 2020   3 grown children    Realtor   No alcohol never smoker never drug user   Social Determinants of Corporate investment banker Strain: Not on file  Food Insecurity: Not on file  Transportation Needs: Not on file  Physical Activity: Not on file  Stress: Not on file  Social Connections: Not on file     Family History: The patient's family history includes Cancer in her sister; Colon cancer in her sister; Diabetes in her daughter; Heart attack in her brother and son; Heart disease in her brother and father; Other in her mother. There is no history of Esophageal cancer, Rectal cancer, Stomach cancer, or Liver cancer.  ROS:   Please see the history of  present illness.    Review of Systems  Constitutional: Negative for chills, fever and malaise/fatigue.  HENT: Negative for sore throat.   Eyes: Negative for blurred vision and redness.  Respiratory: Positive for shortness of breath.   Cardiovascular: Positive for chest pain and palpitations. Negative for orthopnea, claudication, leg swelling and PND.  Gastrointestinal: Negative for melena, nausea and vomiting.  Genitourinary: Negative for dysuria.  Musculoskeletal: Negative for falls.  Neurological: Negative for dizziness and loss of consciousness.  Endo/Heme/Allergies: Negative for polydipsia.  Psychiatric/Behavioral: Negative for substance abuse.    EKGs/Labs/Other Studies Reviewed:    The  following studies were reviewed today: No cardiac studies  EKG:  EKG 07/28/20: NSR with no ischemia or block.  Recent Labs: 07/28/2020: ALT 35; BUN 9; Creatinine, Ser 0.75; Hemoglobin 13.0; Platelets 301; Potassium 3.6; Sodium 145; TSH 1.840  Recent Lipid Panel    Component Value Date/Time   CHOL 174 07/28/2020 1637   TRIG 137 07/28/2020 1637   TRIG 215 (H) 01/30/2014 1133   HDL 64 07/28/2020 1637   HDL 55 01/30/2014 1133   CHOLHDL 2.7 07/28/2020 1637   LDLCALC 86 07/28/2020 1637   LDLCALC 194 (H) 01/30/2014 1133     Risk Assessment/Calculations:       Physical Exam:    VS:  There were no vitals taken for this visit.    Wt Readings from Last 3 Encounters:  08/18/20 124 lb 3.2 oz (56.3 kg)  07/28/20 120 lb (54.4 kg)  05/23/20 123 lb 3.2 oz (55.9 kg)     GEN:  Well nourished, well developed in no acute distress HEENT: Normal NECK: No JVD; No carotid bruits CARDIAC: RRR, no murmurs, rubs, gallops RESPIRATORY:  Clear to auscultation without rales, wheezing or rhonchi  ABDOMEN: Soft, non-tender, non-distended MUSCULOSKELETAL:  No edema; No deformity  SKIN: Warm and dry NEUROLOGIC:  Alert and oriented x 3 PSYCHIATRIC:  Normal affect   ASSESSMENT:    No diagnosis found. PLAN:    In order of problems listed above:  #Chest Pain: Patient with intermittent chest burning that has been ongoing for several weeks. Pain is atypical and not exertional related, however, symptoms have been progressing and not responding to PPI. Notably, has a very strong family history of premature CAD. Will perform ischemic work-up. -Coronary CTA -TTE -Start ASA 81mg  daily and can stop if CTA reassuring -Continue simvastatin 40mg  daily  #HTN: Well controlled at home off medications. -Continue to monitor at home  #HLD: -Continue simvastatin 40mg  daily and adjust as needed pending coronary CTA       Medication Adjustments/Labs and Tests Ordered: Current medicines are reviewed at length  with the patient today.  Concerns regarding medicines are outlined above.  No orders of the defined types were placed in this encounter.  No orders of the defined types were placed in this encounter.   There are no Patient Instructions on file for this visit.   Signed, , MD  11/18/2020 11:34 AM    [facility]

## 2020-11-24 ENCOUNTER — Ambulatory Visit: Payer: Medicare HMO | Admitting: Cardiology

## 2020-11-28 ENCOUNTER — Ambulatory Visit: Payer: Medicare HMO

## 2020-12-03 ENCOUNTER — Ambulatory Visit (INDEPENDENT_AMBULATORY_CARE_PROVIDER_SITE_OTHER): Payer: Medicare HMO | Admitting: Nurse Practitioner

## 2020-12-03 ENCOUNTER — Encounter: Payer: Self-pay | Admitting: Nurse Practitioner

## 2020-12-03 DIAGNOSIS — J0101 Acute recurrent maxillary sinusitis: Secondary | ICD-10-CM

## 2020-12-03 MED ORDER — AZITHROMYCIN 250 MG PO TABS: ORAL_TABLET | ORAL | 0 refills | Status: DC

## 2020-12-03 NOTE — Progress Notes (Signed)
Virtual Visit  Note Due to COVID-19 pandemic this visit was conducted virtually. This visit type was conducted due to national recommendations for restrictions regarding the COVID-19 Pandemic (e.g. social distancing, sheltering in place) in an effort to limit this patient's exposure and mitigate transmission in our community. All issues noted in this document were discussed and addressed.  A physical exam was not performed with this format.  I connected with [name] on 12/03/20 at 8:00 by telephone and verified that I am speaking with the correct person using two identifiers. Courtney Johnson is currently located at home and no one is currently with her during visit. The provider, [name], FNP is located in their office at time of visit.  I discussed the limitations, risks, security and privacy concerns of performing an evaluation and management service by telephone and the availability of in person appointments. I also discussed with the patient that there may be a patient responsible charge related to this service. The patient expressed understanding and agreed to proceed.   History and Present Illness:  Chief Complaint: Sinusitis   HPI Patient calls in stating that she started with an ear ache on Saturday. She has become more congested and has developed right sided facial pain. She has been taking OTC sinus pills. She has not been around anyone that is sick. She has not had an covid vaccines and she says she had covid last year.    Review of Systems  Constitutional:  Negative for chills and fever.  HENT:  Positive for congestion and sinus pain. Negative for sore throat.   Respiratory:  Negative for cough, sputum production and shortness of breath.   Musculoskeletal:  Negative for myalgias.  Neurological:  Negative for dizziness and headaches.    Observations/Objective: Alert and oriented- answers all questions appropriately No distress Voice hoarse No  cough noted  Assessment and Plan: [name] in today with chief complaint of Sinusitis   1. Acute recurrent maxillary sinusitis 1. Take meds as prescribed 2. Use a cool mist humidifier especially during the winter months and when heat has been humid. 3. Use saline nose sprays frequently 4. Saline irrigations of the nose can be very helpful if done frequently.  * 4X daily for 1 week*  * Use of a nettie pot can be helpful with this. Follow directions with this* 5. Drink plenty of fluids 6. Keep thermostat turn down low 7.For any cough or congestion  Use plain Mucinex- regular strength or max strength is fine   * Children- consult with Pharmacist for dosing 8. For fever or aces or pains- take tylenol or ibuprofen appropriate for age and weight.  * for fevers greater than 101 orally you may alternate ibuprofen and tylenol every  3 hours.   Meds ordered this encounter  Medications   azithromycin (ZITHROMAX Z-PAK) 250 MG tablet    Sig: As directed    Dispense:  6 tablet    Refill:  0    Order Specific Question:   Supervising Provider    Answer:   Arville Care A [[id number]]          Follow Up Instructions: prn    I discussed the assessment and treatment plan with the patient. The patient was provided an opportunity to ask questions and all were answered. The patient agreed with the plan and demonstrated an understanding of the instructions.   The patient was advised to call back or seek an in-person evaluation if the symptoms  worsen or if the condition fails to improve as anticipated.  The above assessment and management plan was discussed with the patient. The patient verbalized understanding of and has agreed to the management plan. Patient is aware to call the clinic if symptoms persist or worsen. Patient is aware when to return to the clinic for a follow-up visit. Patient educated on when it is appropriate to go to the emergency department.   Time call ended:   8:13  I provided 13 minutes of  non face-to-face time during this encounter.    [name], FNP

## 2021-01-15 ENCOUNTER — Other Ambulatory Visit: Payer: Self-pay | Admitting: Nurse Practitioner

## 2021-01-15 DIAGNOSIS — E782 Mixed hyperlipidemia: Secondary | ICD-10-CM

## 2021-01-28 ENCOUNTER — Ambulatory Visit: Payer: Self-pay | Admitting: Nurse Practitioner

## 2021-02-16 ENCOUNTER — Ambulatory Visit (INDEPENDENT_AMBULATORY_CARE_PROVIDER_SITE_OTHER): Payer: Medicare HMO | Admitting: Nurse Practitioner

## 2021-02-16 ENCOUNTER — Encounter: Payer: Self-pay | Admitting: Nurse Practitioner

## 2021-02-16 ENCOUNTER — Other Ambulatory Visit: Payer: Self-pay

## 2021-02-16 VITALS — BP 154/87 | HR 81 | Temp 97.7°F | Resp 20 | Ht 63.0 in | Wt 126.0 lb

## 2021-02-16 DIAGNOSIS — M810 Age-related osteoporosis without current pathological fracture: Secondary | ICD-10-CM

## 2021-02-16 DIAGNOSIS — K219 Gastro-esophageal reflux disease without esophagitis: Secondary | ICD-10-CM | POA: Diagnosis not present

## 2021-02-16 DIAGNOSIS — F411 Generalized anxiety disorder: Secondary | ICD-10-CM | POA: Diagnosis not present

## 2021-02-16 DIAGNOSIS — E782 Mixed hyperlipidemia: Secondary | ICD-10-CM | POA: Diagnosis not present

## 2021-02-16 MED ORDER — SIMVASTATIN 40 MG PO TABS: 40.0000 mg | ORAL_TABLET | Freq: Every day | ORAL | 1 refills | Status: DC

## 2021-02-16 MED ORDER — PANTOPRAZOLE SODIUM 40 MG PO TBEC
40.0000 mg | DELAYED_RELEASE_TABLET | Freq: Every day | ORAL | 1 refills | Status: DC
Start: 2021-02-16 — End: 2021-08-24

## 2021-02-16 NOTE — Progress Notes (Addendum)
Subjective:    Patient ID: Courtney Johnson, female    DOB: [birth year], 75 y.o.   MRN: [id number]   Chief Complaint: Medical Management of Chronic Issues (6 mo )    HPI:  1. Mixed hyperlipidemia NO c/o chest pain, sob or headache. She checks her blood pressure at home and is always in 158'X systolic. BP Readings from Last 3 Encounters:  02/16/21 (!) 154/87  09/01/20 115/70  08/18/20 140/82     2. Gastroesophageal reflux disease, unspecified whether esophagitis present Is on protonix daily and is doing well.  3. Generalized anxiety disorder Has xanax but does not take everyday. Only takes maybe 1-2 x a month. GAD 7 : Generalized Anxiety Score 02/16/2021 07/28/2020 01/21/2020 07/23/2019  Nervous, Anxious, on Edge 0 1 1 0  Control/stop worrying 0 0 0 0  Worry too much - different things 0 0 1 0  Trouble relaxing 0 1 0 0  Restless 0 0 0 0  Easily annoyed or irritable 0 0 0 0  Afraid - awful might happen 0 0 0 0  Total GAD 7 Score 0 2 2 0  Anxiety Difficulty - Not difficult at all Not difficult at all Not difficult at all    Doesn o   4. Age-related osteoporosis without current pathological fracture Does no weight bearing exercises. Her last dexascan was done 08/30/20 with t score of -2.8    Outpatient Encounter Medications as of 02/16/2021  Medication Sig   alendronate (FOSAMAX) 70 MG tablet Take with a full glass of water on an empty stomach.   ALPRAZolam (XANAX) 0.25 MG tablet Take 1 tablet (0.25 mg total) by mouth 2 (two) times daily as needed for sleep.   Cholecalciferol (VITAMIN D) 50 MCG (2000 UT) tablet Take 2,000 Units by mouth daily.   phenylephrine (,USE FOR PREPARATION-H,) 0.25 % suppository Place 1 suppository rectally as needed for hemorrhoids.   simvastatin (ZOCOR) 40 MG tablet Take 1 tablet by mouth once daily   pantoprazole (PROTONIX) 40 MG tablet Take 1 tablet (40 mg total) by mouth daily. (Patient not taking: Reported on 02/16/2021)   [DISCONTINUED]  azithromycin (ZITHROMAX Z-PAK) 250 MG tablet As directed   [DISCONTINUED] metoprolol tartrate (LOPRESSOR) 100 MG tablet TAKE 1 TABLET BY MOUTH 2 HOURS PRIOR TO YOUR CARDIAC CT   No facility-administered encounter medications on file as of 02/16/2021.    Past Surgical History:  Procedure Laterality Date   ABDOMINAL HYSTERECTOMY     partial    COLONOSCOPY     EYE SURGERY     cataracts removed - bilateral     Family History  Problem Relation Age of Onset   Other Mother        age and frailty    Heart disease Father        by-pass   Heart attack Brother        39    Heart disease Brother    Diabetes Daughter    Colon cancer Sister    Cancer Sister        brain tumor - 29   Heart attack Son        at age 16   Esophageal cancer Neg Hx    Rectal cancer Neg Hx    Stomach cancer Neg Hx    Liver cancer Neg Hx     New complaints: None today  Social history: Lives by herself. Family check on her daily  Controlled substance contract: 01/22/20  Review of Systems  Constitutional:  Negative for diaphoresis.  Eyes:  Negative for pain.  Respiratory:  Negative for shortness of breath.   Cardiovascular:  Negative for chest pain, palpitations and leg swelling.  Gastrointestinal:  Negative for abdominal pain.  Endocrine: Negative for polydipsia.  Skin:  Negative for rash.  Neurological:  Negative for dizziness, weakness and headaches.  Hematological:  Does not bruise/bleed easily.  All other systems reviewed and are negative.     Objective:   Physical Exam Vitals and nursing note reviewed.  Constitutional:      General: She is not in acute distress.    Appearance: Normal appearance. She is well-developed.  HENT:     Head: Normocephalic.     Right Ear: Tympanic membrane normal.     Left Ear: Tympanic membrane normal.     Nose: Nose normal.     Mouth/Throat:     Mouth: Mucous membranes are moist.  Eyes:     Pupils: Pupils are equal, round, and reactive to light.   Neck:     Vascular: No carotid bruit or JVD.  Cardiovascular:     Rate and Rhythm: Normal rate and regular rhythm.     Heart sounds: Normal heart sounds.  Pulmonary:     Effort: Pulmonary effort is normal. No respiratory distress.     Breath sounds: Normal breath sounds. No wheezing or rales.  Chest:     Chest wall: No tenderness.  Abdominal:     General: Bowel sounds are normal. There is no distension or abdominal bruit.     Palpations: Abdomen is soft. There is no hepatomegaly, splenomegaly, mass or pulsatile mass.     Tenderness: There is no abdominal tenderness.  Musculoskeletal:        General: Normal range of motion.     Cervical back: Normal range of motion and neck supple.  Lymphadenopathy:     Cervical: No cervical adenopathy.  Skin:    General: Skin is warm and dry.  Neurological:     Mental Status: She is alert and oriented to person, place, and time.     Deep Tendon Reflexes: Reflexes are normal and symmetric.  Psychiatric:        Behavior: Behavior normal.        Thought Content: Thought content normal.        Judgment: Judgment normal.    BP (!) 154/87   Pulse 81   Temp 97.7 F (36.5 C)   Resp 20   Ht 5' 3" (1.6 m)   Wt 126 lb (57.2 kg)   SpO2 99%   BMI 22.32 kg/m        Assessment & Plan:  LOURETTA TANTILLO comes in today with chief complaint of Medical Management of Chronic Issues (6 mo )   Diagnosis and orders addressed:  1. Mixed hyperlipidemia W fat diet - simvastatin (ZOCOR) 40 MG tablet; Take 1 tablet (40 mg total) by mouth daily.  Dispense: 90 tablet; Refill: 1  2. Gastroesophageal reflux disease, unspecified whether esophagitis present Avoid spicy foods Do not eat 2 hours prior to bedtime - pantoprazole (PROTONIX) 40 MG tablet; Take 1 tablet (40 mg total) by mouth daily.  Dispense: 90 tablet; Refill: 1  3. Generalized anxiety disorder Stress management  4. Age-related osteoporosis without current pathological fracture Weight  bearing exercise  Orders Placed This Encounter  Procedures   CBC with Differential/Platelet   CMP14+EGFR   Lipid panel    Labs pending Health Maintenance reviewed  Diet and exercise encouraged  Follow up plan: 6 months   Mary-Margaret Hassell Done, FNP

## 2021-02-16 NOTE — Addendum Note (Signed)
Addended by: Bennie Pierini on: 02/16/2021 12:33 PM   Modules accepted: Orders

## 2021-02-16 NOTE — Patient Instructions (Signed)

## 2021-02-17 LAB — LIPID PANEL
Chol/HDL Ratio: 2.8 ratio (ref 0.0–4.4)
Cholesterol, Total: 173 mg/dL (ref 100–199)
HDL: 62 mg/dL (ref >39)
LDL Chol Calc (NIH): 88 mg/dL (ref 0–99)
Triglycerides: 130 mg/dL (ref 0–149)
VLDL Cholesterol Cal: 23 mg/dL (ref 5–40)

## 2021-02-17 LAB — CMP14+EGFR
ALT: 20 IU/L (ref 0–32)
AST: 25 IU/L (ref 0–40)
Albumin/Globulin Ratio: 1.8 (ref 1.2–2.2)
Albumin: 4.4 g/dL (ref 3.7–4.7)
Alkaline Phosphatase: 54 IU/L (ref 44–121)
BUN/Creatinine Ratio: 13 (ref 12–28)
BUN: 10 mg/dL (ref 8–27)
Bilirubin Total: 0.3 mg/dL (ref 0.0–1.2)
CO2: 24 mmol/L (ref 20–29)
Calcium: 9.2 mg/dL (ref 8.7–10.3)
Chloride: 104 mmol/L (ref 96–106)
Creatinine, Ser: 0.76 mg/dL (ref 0.57–1.00)
Globulin, Total: 2.4 g/dL (ref 1.5–4.5)
Glucose: 89 mg/dL (ref 65–99)
Potassium: 4 mmol/L (ref 3.5–5.2)
Sodium: 141 mmol/L (ref 134–144)
Total Protein: 6.8 g/dL (ref 6.0–8.5)
eGFR: 83 mL/min/{1.73_m2} (ref >59)

## 2021-02-17 LAB — CBC WITH DIFFERENTIAL/PLATELET
Basophils Absolute: 0 10*3/uL (ref 0.0–0.2)
Basos: 1 %
EOS (ABSOLUTE): 0 10*3/uL (ref 0.0–0.4)
Eos: 1 %
Hematocrit: 40.4 % (ref 34.0–46.6)
Hemoglobin: 13.7 g/dL (ref 11.1–15.9)
Immature Grans (Abs): 0 10*3/uL (ref 0.0–0.1)
Immature Granulocytes: 0 %
Lymphocytes Absolute: 1.6 10*3/uL (ref 0.7–3.1)
Lymphs: 26 %
MCH: 31 pg (ref 26.6–33.0)
MCHC: 33.9 g/dL (ref 31.5–35.7)
MCV: 91 fL (ref 79–97)
Monocytes Absolute: 0.3 10*3/uL (ref 0.1–0.9)
Monocytes: 6 %
Neutrophils Absolute: 4.1 10*3/uL (ref 1.4–7.0)
Neutrophils: 66 %
Platelets: 284 10*3/uL (ref 150–450)
RBC: 4.42 x10E6/uL (ref 3.77–5.28)
RDW: 12.5 % (ref 11.7–15.4)
WBC: 6.1 10*3/uL (ref 3.4–10.8)

## 2021-02-20 ENCOUNTER — Other Ambulatory Visit: Payer: Self-pay | Admitting: Nurse Practitioner

## 2021-02-20 DIAGNOSIS — F411 Generalized anxiety disorder: Secondary | ICD-10-CM

## 2021-02-27 ENCOUNTER — Other Ambulatory Visit: Payer: Self-pay

## 2021-02-27 DIAGNOSIS — M810 Age-related osteoporosis without current pathological fracture: Secondary | ICD-10-CM

## 2021-02-27 MED ORDER — ALENDRONATE SODIUM 70 MG PO TABS: ORAL_TABLET | ORAL | 1 refills | Status: DC

## 2021-03-04 ENCOUNTER — Telehealth: Payer: Self-pay | Admitting: Nurse Practitioner

## 2021-03-04 NOTE — Telephone Encounter (Signed)
Patient declined AWV 

## 2021-05-11 ENCOUNTER — Ambulatory Visit (INDEPENDENT_AMBULATORY_CARE_PROVIDER_SITE_OTHER): Payer: Medicare HMO | Admitting: Nurse Practitioner

## 2021-05-11 ENCOUNTER — Encounter: Payer: Self-pay | Admitting: Nurse Practitioner

## 2021-05-11 DIAGNOSIS — J00 Acute nasopharyngitis [common cold]: Secondary | ICD-10-CM

## 2021-05-11 MED ORDER — FLUTICASONE PROPIONATE 50 MCG/ACT NA SUSP: 2.0000 | Freq: Every day | NASAL | 6 refills | Status: DC

## 2021-05-11 NOTE — Progress Notes (Signed)
Virtual Visit  Note Due to COVID-19 pandemic this visit was conducted virtually. This visit type was conducted due to national recommendations for restrictions regarding the COVID-19 Pandemic (e.g. social distancing, sheltering in place) in an effort to limit this patient's exposure and mitigate transmission in our community. All issues noted in this document were discussed and addressed.  A physical exam was not performed with this format.  I connected with Courtney Johnson on 05/11/21 at 10:30 by telephone and verified that I am speaking with the correct person using two identifiers. Courtney Johnson is currently located at home and no one is currently with her during visit. The provider, [name], FNP is located in their office at time of visit.  I discussed the limitations, risks, security and privacy concerns of performing an evaluation and management service by telephone and the availability of in person appointments. I also discussed with the patient that there may be a patient responsible charge related to this service. The patient expressed understanding and agreed to proceed.   History and Present Illness:  Patient states she has nasal congestion, sore thorat and bil ear pain. Started on Saturday  morning. The sinus pressure is bothering her. Sh ehas no fever. She is taking alkaseltzer plus.      Review of Systems  Constitutional:  Positive for malaise/fatigue. Negative for chills and weight loss.  HENT:  Positive for congestion, sinus pain and sore throat.   Respiratory:  Positive for cough (slight). Negative for sputum production, shortness of breath and wheezing.   Musculoskeletal:  Negative for myalgias.  Neurological:  Negative for headaches.    Observations/Objective: Alert and oriented- answers all questions appropriately No distress Voice hoarse   Assessment and Plan: Courtney Johnson in today with chief complaint of No chief complaint on  file.   1. Acute nasopharyngitis 1. Take meds as prescribed 2. Use a cool mist humidifier especially during the winter months and when heat has been humid. 3. Use saline nose sprays frequently 4. Saline irrigations of the nose can be very helpful if done frequently.  * 4X daily for 1 week*  * Use of a nettie pot can be helpful with this. Follow directions with this* 5. Drink plenty of fluids 6. Keep thermostat turn down low 7.For any cough or congestion  Use plain Mucinex- regular strength or max strength is fine   * Children- consult with Pharmacist for dosing 8. For fever or aces or pains- take tylenol or ibuprofen appropriate for age and weight.  * for fevers greater than 101 orally you may alternate ibuprofen and tylenol every  3 hours.   Meds ordered this encounter  Medications   fluticasone (FLONASE) 50 MCG/ACT nasal spray    Sig: Place 2 sprays into both nostrils daily.    Dispense:  16 g    Refill:  6    Order Specific Question:   Supervising Provider    Answer:   Arville Care A [[id number]]      Follow Up Instructions: prn    I discussed the assessment and treatment plan with the patient. The patient was provided an opportunity to ask questions and all were answered. The patient agreed with the plan and demonstrated an understanding of the instructions.   The patient was advised to call back or seek an in-person evaluation if the symptoms worsen or if the condition fails to improve as anticipated.  The above assessment and management plan was discussed with the patient. The  patient verbalized understanding of and has agreed to the management plan. Patient is aware to call the clinic if symptoms persist or worsen. Patient is aware when to return to the clinic for a follow-up visit. Patient educated on when it is appropriate to go to the emergency department.   Time call ended:  10:42  I provided 11 minutes of  non face-to-face time during this  encounter.    [name], FNP

## 2021-05-11 NOTE — Patient Instructions (Signed)

## 2021-06-02 DIAGNOSIS — Z1231 Encounter for screening mammogram for malignant neoplasm of breast: Secondary | ICD-10-CM | POA: Diagnosis not present

## 2021-07-20 ENCOUNTER — Other Ambulatory Visit: Payer: Self-pay | Admitting: *Deleted

## 2021-07-20 DIAGNOSIS — M810 Age-related osteoporosis without current pathological fracture: Secondary | ICD-10-CM

## 2021-07-20 MED ORDER — ALENDRONATE SODIUM 70 MG PO TABS: ORAL_TABLET | ORAL | 0 refills | Status: DC

## 2021-08-13 ENCOUNTER — Other Ambulatory Visit: Payer: Self-pay | Admitting: Cardiology

## 2021-08-13 DIAGNOSIS — E782 Mixed hyperlipidemia: Secondary | ICD-10-CM

## 2021-08-13 DIAGNOSIS — I259 Chronic ischemic heart disease, unspecified: Secondary | ICD-10-CM

## 2021-08-24 ENCOUNTER — Encounter: Payer: Self-pay | Admitting: Nurse Practitioner

## 2021-08-24 ENCOUNTER — Ambulatory Visit (INDEPENDENT_AMBULATORY_CARE_PROVIDER_SITE_OTHER): Payer: Medicare HMO | Admitting: Nurse Practitioner

## 2021-08-24 VITALS — BP 167/92 | HR 74 | Temp 96.9°F | Resp 20 | Ht 63.0 in | Wt 127.0 lb

## 2021-08-24 DIAGNOSIS — M810 Age-related osteoporosis without current pathological fracture: Secondary | ICD-10-CM

## 2021-08-24 DIAGNOSIS — E782 Mixed hyperlipidemia: Secondary | ICD-10-CM | POA: Diagnosis not present

## 2021-08-24 DIAGNOSIS — F411 Generalized anxiety disorder: Secondary | ICD-10-CM | POA: Diagnosis not present

## 2021-08-24 DIAGNOSIS — K219 Gastro-esophageal reflux disease without esophagitis: Secondary | ICD-10-CM

## 2021-08-24 MED ORDER — ALPRAZOLAM 0.25 MG PO TABS: 0.2500 mg | ORAL_TABLET | Freq: Two times a day (BID) | ORAL | 1 refills | Status: DC | PRN

## 2021-08-24 MED ORDER — PANTOPRAZOLE SODIUM 40 MG PO TBEC: 40.0000 mg | DELAYED_RELEASE_TABLET | Freq: Every day | ORAL | 1 refills | Status: DC

## 2021-08-24 MED ORDER — SIMVASTATIN 40 MG PO TABS: 40.0000 mg | ORAL_TABLET | Freq: Every day | ORAL | 1 refills | Status: DC

## 2021-08-24 NOTE — Addendum Note (Signed)
Addended by: Bennie Pierini on: 08/24/2021 11:46 AM ? ? Modules accepted: Orders ? ?

## 2021-08-24 NOTE — Progress Notes (Signed)
? ?Subjective:  ? ? Patient ID: Courtney Johnson, female    DOB: August 07, 1945, 76 y.o.   MRN: [id number] ? ?Chief Complaint: medical management of chronic issues  ?  ? ?HPI: ? ?Courtney Johnson is a 76 y.o. who identifies as a female who was assigned female at birth.  ? ?Social history: ?Lives with: by herself- family checks on her daily ?Work history: retired ? ? ?Comes in today for follow up of the following chronic medical issues: ? ?1. Mixed hyperlipidemia ?Does try to watch diet. Does little to no dedicated exercise. ?Lab Results  ?Component Value Date  ? CHOL 173 02/16/2021  ? HDL 62 02/16/2021  ? LDLCALC 88 02/16/2021  ? TRIG 130 02/16/2021  ? CHOLHDL 2.8 02/16/2021  ? ?The 10-year ASCVD risk score (Arnett DK, et al., 2019) is: 22.7% ? ? ?2. Gastroesophageal reflux disease, unspecified whether esophagitis present ?Is on protonix daily and is doing well. ? ?3. Generalized anxiety disorder ?Is on xanax as needed. Only takes 1-2 x a week. ?GAD 7 : Generalized Anxiety Score 08/24/2021 02/16/2021 07/28/2020 01/21/2020  ?Nervous, Anxious, on Edge 0 0 1 1  ?Control/stop worrying 0 0 0 0  ?Worry too much - different things 0 0 0 1  ?Trouble relaxing 0 0 1 0  ?Restless 0 0 0 0  ?Easily annoyed or irritable 0 0 0 0  ?Afraid - awful might happen 0 0 0 0  ?Total GAD 7 Score 0 0 2 2  ?Anxiety Difficulty Not difficult at all - Not difficult at all Not difficult at all  ? ? ? ? ?4. Age-related osteoporosis without current pathological fracture ?Last dexascan was done on 08/26/20 with t score of -2.0. has been on fosamax for greater then 5 years. ? ? ?New complaints: ?None today ? ?No Known Allergies ?Outpatient Encounter Medications as of 08/24/2021  ?Medication Sig  ? alendronate (FOSAMAX) 70 MG tablet Take with a full glass of water on an empty stomach.  ? ALPRAZolam (XANAX) 0.25 MG tablet TAKE 1 TABLET BY MOUTH TWICE DAILY AS NEEDED FOR SLEEP  ? Cholecalciferol (VITAMIN D) 50 MCG (2000 UT) tablet Take 2,000 Units by mouth daily.  ?  fluticasone (FLONASE) 50 MCG/ACT nasal spray Place 2 sprays into both nostrils daily.  ? pantoprazole (PROTONIX) 40 MG tablet Take 1 tablet (40 mg total) by mouth daily.  ? phenylephrine (,USE FOR PREPARATION-H,) 0.25 % suppository Place 1 suppository rectally as needed for hemorrhoids.  ? simvastatin (ZOCOR) 40 MG tablet Take 1 tablet (40 mg total) by mouth daily.  ? ?No facility-administered encounter medications on file as of 08/24/2021.  ? ? ?Past Surgical History:  ?Procedure Laterality Date  ? ABDOMINAL HYSTERECTOMY    ? partial   ? COLONOSCOPY    ? EYE SURGERY    ? cataracts removed - bilateral   ? ? ?Family History  ?Problem Relation Age of Onset  ? Other Mother   ?     age and frailty   ? Heart disease Father   ?     by-pass  ? Heart attack Brother   ?     54   ? Heart disease Brother   ? Diabetes Daughter   ? Colon cancer Sister   ? Cancer Sister   ?     brain tumor - 55  ? Heart attack Son   ?     at age 48  ? Esophageal cancer Neg Hx   ? Rectal cancer  Neg Hx   ? Stomach cancer Neg Hx   ? Liver cancer Neg Hx   ? ? ? ? ?Controlled substance contract: n/a ? ? ? ? ?Review of Systems  ?Constitutional:  Negative for diaphoresis.  ?Eyes:  Negative for pain.  ?Respiratory:  Negative for shortness of breath.   ?Cardiovascular:  Negative for chest pain, palpitations and leg swelling.  ?Gastrointestinal:  Negative for abdominal pain.  ?Endocrine: Negative for polydipsia.  ?Skin:  Negative for rash.  ?Neurological:  Negative for dizziness, weakness and headaches.  ?Hematological:  Does not bruise/bleed easily.  ?All other systems reviewed and are negative. ? ?   ?Objective:  ? Physical Exam ?Vitals and nursing note reviewed.  ?Constitutional:   ?   General: She is not in acute distress. ?   Appearance: Normal appearance. She is well-developed.  ?HENT:  ?   Head: Normocephalic.  ?   Right Ear: Tympanic membrane normal.  ?   Left Ear: Tympanic membrane normal.  ?   Nose: Nose normal.  ?   Mouth/Throat:  ?   Mouth:  Mucous membranes are moist.  ?Eyes:  ?   Pupils: Pupils are equal, round, and reactive to light.  ?Neck:  ?   Vascular: No carotid bruit or JVD.  ?Cardiovascular:  ?   Rate and Rhythm: Normal rate and regular rhythm.  ?   Heart sounds: Normal heart sounds.  ?Pulmonary:  ?   Effort: Pulmonary effort is normal. No respiratory distress.  ?   Breath sounds: Normal breath sounds. No wheezing or rales.  ?Chest:  ?   Chest wall: No tenderness.  ?Abdominal:  ?   General: Bowel sounds are normal. There is no distension or abdominal bruit.  ?   Palpations: Abdomen is soft. There is no hepatomegaly, splenomegaly, mass or pulsatile mass.  ?   Tenderness: There is no abdominal tenderness.  ?Musculoskeletal:     ?   General: Normal range of motion.  ?   Cervical back: Normal range of motion and neck supple.  ?Lymphadenopathy:  ?   Cervical: No cervical adenopathy.  ?Skin: ?   General: Skin is warm and dry.  ?Neurological:  ?   Mental Status: She is alert and oriented to person, place, and time.  ?   Deep Tendon Reflexes: Reflexes are normal and symmetric.  ?Psychiatric:     ?   Behavior: Behavior normal.     ?   Thought Content: Thought content normal.     ?   Judgment: Judgment normal.  ? ? ?BP (!) 167/92   Pulse 74   Temp (!) 96.9 ?F (36.1 ?C) (Temporal)   Resp 20   Ht 5\' 3"  (1.6 m)   Wt 127 lb (57.6 kg)   SpO2 97%   BMI 22.50 kg/m?  ? ? ? ?   ?Assessment & Plan:  ?Courtney Johnson comes in today with chief complaint of Medical Management of Chronic Issues ? ? ?Diagnosis and orders addressed: ? ?1. Mixed hyperlipidemia ?Low fat diet ?- simvastatin (ZOCOR) 40 MG tablet; Take 1 tablet (40 mg total) by mouth daily.  Dispense: 90 tablet; Refill: 1 ? ?2. Gastroesophageal reflux disease, unspecified whether esophagitis present ?Avoid spicy foods ?Do not eat 2 hours prior to bedtime ?- pantoprazole (PROTONIX) 40 MG tablet; Take 1 tablet (40 mg total) by mouth daily.  Dispense: 90 tablet; Refill: 1 ? ?3. Generalized anxiety  disorder ?Stress management ?- ALPRAZolam (XANAX) 0.25 MG tablet; Take 1 tablet (  0.25 mg total) by mouth 2 (two) times daily as needed for anxiety.  Dispense: 60 tablet; Refill: 1 ? ?4. Age-related osteoporosis without current pathological fracture ?Weight bearing exercise ?Stp fosamax for a few months ? ? ?Labs pending ?Health Maintenance reviewed ?Diet and exercise encouraged ? ?Follow up plan: ?6 months ? ? ?[name], FNP ? ? ?

## 2021-08-24 NOTE — Patient Instructions (Signed)
Stress, Adult °Stress is a normal reaction to life events. Stress is what you feel when life demands more than you are used to, or more than you think you can handle. °Some stress can be useful, such as studying for a test or meeting a deadline at work. Stress that occurs too often or for too long can cause problems. Long-lasting stress is called chronic stress. Chronic stress can affect your emotional health and interfere with relationships and normal daily activities. °Too much stress can weaken your body's defense system (immune system) and increase your risk for physical illness. If you already have a medical problem, stress can make it worse. °What are the causes? °All sorts of life events can cause stress. An event that causes stress for one person may not be stressful for someone else. Major life events, whether positive or negative, commonly cause stress. Examples include: °Losing a job or starting a new job. °Losing a loved one. °Moving to a new town or home. °Getting married or divorced. °Having a baby. °Getting injured or sick. °Less obvious life events can also cause stress, especially if they occur day after day or in combination with each other. Examples include: °Working long hours. °Driving in traffic. °Caring for children. °Being in debt. °Being in a difficult relationship. °What are the signs or symptoms? °Stress can cause emotional and physical symptoms and can lead to unhealthy behaviors. These include the following: °Emotional symptoms °Anxiety. This is feeling worried, afraid, on edge, overwhelmed, or out of control. °Anger, including irritation or impatience. °Depression. This is feeling sad, down, helpless, or guilty. °Trouble focusing, remembering, or making decisions. °Physical symptoms °Aches and pains. These may affect your head, neck, back, stomach, or other areas of your body. °Tight muscles or a clenched jaw. °Low energy. °Trouble sleeping. °Unhealthy behaviors °Eating to feel better  (overeating) or skipping meals. °Working too much or putting off tasks. °Smoking, drinking alcohol, or using drugs to feel better. °How is this diagnosed? °A stress disorder is diagnosed through an assessment by your health care provider. A stress disorder may be diagnosed based on: °Your symptoms and any stressful life events. °Your medical history. °Tests to rule out other causes of your symptoms. °Depending on your condition, your health care provider may refer you to a specialist for further evaluation. °How is this treated? °Stress management techniques are the recommended treatment for stress. Medicine is not typically recommended for treating stress. °Techniques to reduce your reaction to stressful life events include: °Identifying stress. Monitor yourself for symptoms of stress and notice what causes stress for you. These skills may help you to avoid or prepare for stressful events. °Managing time. Set your priorities, keep a calendar of events, and learn to say no. These actions can help you avoid taking on too much. °Techniques for dealing with stress include: °Rethinking the problem. Try to think realistically about stressful events rather than ignoring them or overreacting. Try to find the positives in a stressful situation rather than focusing on the negatives. °Exercise. Physical exercise can release both physical and emotional tension. The key is to find a form of exercise that you enjoy and do it regularly. °Relaxation techniques. These relax the body and mind. Find one or more that you enjoy and use the techniques regularly. Examples include: °Meditation, deep breathing, or progressive relaxation techniques. °Yoga or tai chi. °Biofeedback, mindfulness techniques, or journaling. °Listening to music, being in nature, or taking part in other hobbies. °Practicing a healthy lifestyle. Eat a balanced diet,   drink plenty of water, limit or avoid caffeine, and get plenty of sleep. °Having a strong support  network. Spend time with family, friends, or other people you enjoy being around. Express your feelings and talk things over with someone you trust. °Counseling or talk therapy with a mental health provider may help if you are having trouble managing stress by yourself. °Follow these instructions at home: °Lifestyle ° °Avoid drugs. °Do not use any products that contain nicotine or tobacco. These products include cigarettes, chewing tobacco, and vaping devices, such as e-cigarettes. If you need help quitting, ask your health care provider. °If you drink alcohol: °Limit how much you have to: °0-1 drink a day for women who are not pregnant. °0-2 drinks a day for men. °Know how much alcohol is in a drink. In the [place] one drink equals one 12 oz bottle of beer (355 mL), one 5 oz glass of wine (148 mL), or one 1½ oz glass of hard liquor (44 mL). °Do not use alcohol or drugs to relax. °Eat a balanced diet that includes fresh fruits and vegetables, whole grains, lean meats, fish, eggs, beans, and low-fat dairy. Avoid processed foods and foods high in added fat, sugar, and salt. °Exercise at least 30 minutes on 5 or more days each week. °Get 7-8 hours of sleep each night. °General instructions ° °Practice stress management techniques as told by your health care provider. °Drink enough fluid to keep your urine pale yellow. °Take over-the-counter and prescription medicines only as told by your health care provider. °Keep all follow-up visits. This is important. °Contact a health care provider if: °Your symptoms get worse. °You have new symptoms. °You feel overwhelmed by your problems and can no longer manage them by yourself. °Get help right away if: °You have thoughts of hurting yourself or others. °Get help right awayif you feel like you may hurt yourself or others, or have thoughts about taking your own life. Go to your nearest emergency room or: °Call 911. °Call the National Suicide Prevention Lifeline at 1-800-273-8255 or  988. This is open 24 hours a day. °Text the Crisis Text Line at 741741. °Summary °Stress is a normal reaction to life events. It can cause problems if it happens too often or for too long. °Practicing stress management techniques is the best way to treat stress. °Counseling or talk therapy with a mental health provider may help if you are having trouble managing stress by yourself. °This information is not intended to replace advice given to you by your health care provider. Make sure you discuss any questions you have with your health care provider. °Document Revised: 01/15/2021 Document Reviewed: 01/15/2021 °[employer]vier Patient Education © 2022 [employer] ° °

## 2021-08-25 LAB — CMP14+EGFR
ALT: 25 IU/L (ref 0–32)
AST: 30 IU/L (ref 0–40)
Albumin/Globulin Ratio: 2.6 — ABNORMAL HIGH (ref 1.2–2.2)
Albumin: 4.7 g/dL (ref 3.7–4.7)
Alkaline Phosphatase: 51 IU/L (ref 44–121)
BUN/Creatinine Ratio: 11 — ABNORMAL LOW (ref 12–28)
BUN: 9 mg/dL (ref 8–27)
Bilirubin Total: 0.4 mg/dL (ref 0.0–1.2)
CO2: 23 mmol/L (ref 20–29)
Calcium: 9.2 mg/dL (ref 8.7–10.3)
Chloride: 108 mmol/L — ABNORMAL HIGH (ref 96–106)
Creatinine, Ser: 0.81 mg/dL (ref 0.57–1.00)
Globulin, Total: 1.8 g/dL (ref 1.5–4.5)
Glucose: 90 mg/dL (ref 70–99)
Potassium: 4.1 mmol/L (ref 3.5–5.2)
Sodium: 144 mmol/L (ref 134–144)
Total Protein: 6.5 g/dL (ref 6.0–8.5)
eGFR: 76 mL/min/{1.73_m2} (ref >59)

## 2021-08-25 LAB — CBC WITH DIFFERENTIAL/PLATELET
Basophils Absolute: 0.1 10*3/uL (ref 0.0–0.2)
Basos: 1 %
EOS (ABSOLUTE): 0.1 10*3/uL (ref 0.0–0.4)
Eos: 2 %
Hematocrit: 39.8 % (ref 34.0–46.6)
Hemoglobin: 13.5 g/dL (ref 11.1–15.9)
Immature Grans (Abs): 0 10*3/uL (ref 0.0–0.1)
Immature Granulocytes: 1 %
Lymphocytes Absolute: 1.7 10*3/uL (ref 0.7–3.1)
Lymphs: 39 %
MCH: 30.5 pg (ref 26.6–33.0)
MCHC: 33.9 g/dL (ref 31.5–35.7)
MCV: 90 fL (ref 79–97)
Monocytes Absolute: 0.3 10*3/uL (ref 0.1–0.9)
Monocytes: 7 %
Neutrophils Absolute: 2.2 10*3/uL (ref 1.4–7.0)
Neutrophils: 50 %
Platelets: 265 10*3/uL (ref 150–450)
RBC: 4.42 x10E6/uL (ref 3.77–5.28)
RDW: 12.3 % (ref 11.7–15.4)
WBC: 4.4 10*3/uL (ref 3.4–10.8)

## 2021-08-25 LAB — LIPID PANEL
Chol/HDL Ratio: 3 ratio (ref 0.0–4.4)
Cholesterol, Total: 172 mg/dL (ref 100–199)
HDL: 58 mg/dL (ref >39)
LDL Chol Calc (NIH): 94 mg/dL (ref 0–99)
Triglycerides: 111 mg/dL (ref 0–149)
VLDL Cholesterol Cal: 20 mg/dL (ref 5–40)

## 2021-12-24 ENCOUNTER — Encounter: Payer: Self-pay | Admitting: *Deleted

## 2022-02-25 ENCOUNTER — Ambulatory Visit: Payer: Medicare HMO | Admitting: Nurse Practitioner

## 2022-03-01 ENCOUNTER — Ambulatory Visit (INDEPENDENT_AMBULATORY_CARE_PROVIDER_SITE_OTHER): Payer: Medicare HMO | Admitting: Nurse Practitioner

## 2022-03-01 ENCOUNTER — Encounter: Payer: Self-pay | Admitting: Nurse Practitioner

## 2022-03-01 VITALS — BP 141/83 | HR 77 | Temp 97.1°F | Resp 20 | Ht 63.0 in | Wt 127.0 lb

## 2022-03-01 DIAGNOSIS — K219 Gastro-esophageal reflux disease without esophagitis: Secondary | ICD-10-CM

## 2022-03-01 DIAGNOSIS — E782 Mixed hyperlipidemia: Secondary | ICD-10-CM | POA: Diagnosis not present

## 2022-03-01 DIAGNOSIS — H6121 Impacted cerumen, right ear: Secondary | ICD-10-CM | POA: Diagnosis not present

## 2022-03-01 DIAGNOSIS — M810 Age-related osteoporosis without current pathological fracture: Secondary | ICD-10-CM

## 2022-03-01 DIAGNOSIS — F411 Generalized anxiety disorder: Secondary | ICD-10-CM

## 2022-03-01 MED ORDER — ALPRAZOLAM 0.25 MG PO TABS: 0.2500 mg | ORAL_TABLET | Freq: Two times a day (BID) | ORAL | 2 refills | Status: DC | PRN

## 2022-03-01 MED ORDER — SIMVASTATIN 40 MG PO TABS: 40.0000 mg | ORAL_TABLET | Freq: Every day | ORAL | 1 refills | Status: DC

## 2022-03-01 MED ORDER — PANTOPRAZOLE SODIUM 40 MG PO TBEC: 40.0000 mg | DELAYED_RELEASE_TABLET | Freq: Every day | ORAL | 1 refills | Status: DC

## 2022-03-01 NOTE — Progress Notes (Signed)
Subjective:    Patient ID: Courtney Johnson, female    DOB: 1946-05-17, 76 y.o.   MRN: [id number]   Chief Complaint: medical management of chronic issues     HPI:  Courtney Johnson is a 76 y.o. who identifies as a female who was assigned female at birth.   Social history: Lives with: lives by herself Work history: retired   Scientist, forensic in today for follow up of the following chronic medical issues:  1. Mixed hyperlipidemia Does not watch diet and does no dedicated exercise. Lab Results  Component Value Date   CHOL 172 08/24/2021   HDL 58 08/24/2021   LDLCALC 94 08/24/2021   TRIG 111 08/24/2021   CHOLHDL 3.0 08/24/2021     2. Gastroesophageal reflux disease, unspecified whether esophagitis present Is on protonix daily and is doing well  3. Generalized anxiety disorder Is on xanax bid    03/01/2022   11:38 AM 08/24/2021   11:27 AM 02/16/2021   12:02 PM 07/28/2020    3:28 PM  GAD 7 : Generalized Anxiety Score  Nervous, Anxious, on Edge 0 0 0 1  Control/stop worrying 0 0 0 0  Worry too much - different things 0 0 0 0  Trouble relaxing 0 0 0 1  Restless 0 0 0 0  Easily annoyed or irritable 0 0 0 0  Afraid - awful might happen 0 0 0 0  Total GAD 7 Score 0 0 0 2  Anxiety Difficulty Not difficult at all Not difficult at all  Not difficult at all     4. Age-related osteoporosis without current pathological fracture Does no weight bearing exercises. Last dexascan was done on 3/8/2. T score was -2.0.   New complaints: None today  No Known Allergies Outpatient Encounter Medications as of 03/01/2022  Medication Sig   alendronate (FOSAMAX) 70 MG tablet Take with a full glass of water on an empty stomach.   ALPRAZolam (XANAX) 0.25 MG tablet Take 1 tablet (0.25 mg total) by mouth 2 (two) times daily as needed for anxiety.   Cholecalciferol (VITAMIN D) 50 MCG (2000 UT) tablet Take 2,000 Units by mouth daily.   pantoprazole (PROTONIX) 40 MG tablet Take 1 tablet (40 mg total)  by mouth daily.   phenylephrine (,USE FOR PREPARATION-H,) 0.25 % suppository Place 1 suppository rectally as needed for hemorrhoids.   simvastatin (ZOCOR) 40 MG tablet Take 1 tablet (40 mg total) by mouth daily.   No facility-administered encounter medications on file as of 03/01/2022.    Past Surgical History:  Procedure Laterality Date   ABDOMINAL HYSTERECTOMY     partial    COLONOSCOPY     EYE SURGERY     cataracts removed - bilateral     Family History  Problem Relation Age of Onset   Other Mother        age and frailty    Heart disease Father        by-pass   Heart attack Brother        39    Heart disease Brother    Diabetes Daughter    Colon cancer Sister    Cancer Sister        brain tumor - 107   Heart attack Son        at age 54   Esophageal cancer Neg Hx    Rectal cancer Neg Hx    Stomach cancer Neg Hx    Liver cancer Neg Hx  Controlled substance contract: n/a     Review of Systems  Constitutional:  Negative for diaphoresis.  Eyes:  Negative for pain.  Respiratory:  Negative for shortness of breath.   Cardiovascular:  Negative for chest pain, palpitations and leg swelling.  Gastrointestinal:  Negative for abdominal pain.  Endocrine: Negative for polydipsia.  Skin:  Negative for rash.  Neurological:  Negative for dizziness, weakness and headaches.  Hematological:  Does not bruise/bleed easily.  All other systems reviewed and are negative.      Objective:   Physical Exam Vitals and nursing note reviewed.  Constitutional:      General: She is not in acute distress.    Appearance: Normal appearance. She is well-developed.  HENT:     Head: Normocephalic.     Right Ear: Tympanic membrane normal. There is impacted cerumen.     Left Ear: Tympanic membrane normal. There is no impacted cerumen.     Nose: Nose normal.     Mouth/Throat:     Mouth: Mucous membranes are moist.  Eyes:     Pupils: Pupils are equal, round, and reactive to light.   Neck:     Vascular: No carotid bruit or JVD.  Cardiovascular:     Rate and Rhythm: Normal rate and regular rhythm.     Heart sounds: Normal heart sounds.  Pulmonary:     Effort: Pulmonary effort is normal. No respiratory distress.     Breath sounds: Normal breath sounds. No wheezing or rales.  Chest:     Chest wall: No tenderness.  Abdominal:     General: Bowel sounds are normal. There is no distension or abdominal bruit.     Palpations: Abdomen is soft. There is no hepatomegaly, splenomegaly, mass or pulsatile mass.     Tenderness: There is no abdominal tenderness.  Musculoskeletal:        General: Normal range of motion.     Cervical back: Normal range of motion and neck supple.  Lymphadenopathy:     Cervical: No cervical adenopathy.  Skin:    General: Skin is warm and dry.  Neurological:     Mental Status: She is alert and oriented to person, place, and time.     Deep Tendon Reflexes: Reflexes are normal and symmetric.  Psychiatric:        Behavior: Behavior normal.        Thought Content: Thought content normal.        Judgment: Judgment normal.    BP (!) 141/83   Pulse 77   Temp (!) 97.1 F (36.2 C) (Temporal)   Resp 20   Ht 5' 3" (1.6 m)   Wt 127 lb (57.6 kg)   SpO2 96%   BMI 22.50 kg/m    S/P right ear lavage and curetting- TM clear       Assessment & Plan:   Courtney Johnson comes in today with chief complaint of Medical Management of Chronic Issues   Diagnosis and orders addressed:  1. Mixed hyperlipidemia Low fat diet - simvastatin (ZOCOR) 40 MG tablet; Take 1 tablet (40 mg total) by mouth daily.  Dispense: 90 tablet; Refill: 1 - CBC with Differential/Platelet - CMP14+EGFR - Lipid panel  2. Gastroesophageal reflux disease, unspecified whether esophagitis present Avoid spicy foods Do not eat 2 hours prior to bedtime - pantoprazole (PROTONIX) 40 MG tablet; Take 1 tablet (40 mg total) by mouth daily.  Dispense: 90 tablet; Refill: 1  3.  Generalized anxiety disorder Stress management -  ALPRAZolam (XANAX) 0.25 MG tablet; Take 1 tablet (0.25 mg total) by mouth 2 (two) times daily as needed for anxiety.  Dispense: 60 tablet; Refill: 2  4. Age-related osteoporosis without current pathological fracture Weight bearing exercise  5. Impacted cerumen of right ear Debrox 2-3 x a week   Labs pending Health Maintenance reviewed Diet and exercise encouraged  Follow up plan: 6 months   Mary-Margaret Hassell Done, FNP

## 2022-03-01 NOTE — Patient Instructions (Signed)

## 2022-03-02 LAB — CBC WITH DIFFERENTIAL/PLATELET
Basophils Absolute: 0.1 10*3/uL (ref 0.0–0.2)
Basos: 1 %
EOS (ABSOLUTE): 0.2 10*3/uL (ref 0.0–0.4)
Eos: 3 %
Hematocrit: 40.1 % (ref 34.0–46.6)
Hemoglobin: 13.4 g/dL (ref 11.1–15.9)
Immature Grans (Abs): 0 10*3/uL (ref 0.0–0.1)
Immature Granulocytes: 0 %
Lymphocytes Absolute: 1.5 10*3/uL (ref 0.7–3.1)
Lymphs: 30 %
MCH: 30.5 pg (ref 26.6–33.0)
MCHC: 33.4 g/dL (ref 31.5–35.7)
MCV: 91 fL (ref 79–97)
Monocytes Absolute: 0.3 10*3/uL (ref 0.1–0.9)
Monocytes: 7 %
Neutrophils Absolute: 3 10*3/uL (ref 1.4–7.0)
Neutrophils: 59 %
Platelets: 254 10*3/uL (ref 150–450)
RBC: 4.4 x10E6/uL (ref 3.77–5.28)
RDW: 12.4 % (ref 11.7–15.4)
WBC: 5 10*3/uL (ref 3.4–10.8)

## 2022-03-02 LAB — CMP14+EGFR
ALT: 24 IU/L (ref 0–32)
AST: 31 IU/L (ref 0–40)
Albumin/Globulin Ratio: 2 (ref 1.2–2.2)
Albumin: 4.5 g/dL (ref 3.8–4.8)
Alkaline Phosphatase: 60 IU/L (ref 44–121)
BUN/Creatinine Ratio: 12 (ref 12–28)
BUN: 9 mg/dL (ref 8–27)
Bilirubin Total: 0.5 mg/dL (ref 0.0–1.2)
CO2: 22 mmol/L (ref 20–29)
Calcium: 9.3 mg/dL (ref 8.7–10.3)
Chloride: 104 mmol/L (ref 96–106)
Creatinine, Ser: 0.76 mg/dL (ref 0.57–1.00)
Globulin, Total: 2.2 g/dL (ref 1.5–4.5)
Glucose: 92 mg/dL (ref 70–99)
Potassium: 4.1 mmol/L (ref 3.5–5.2)
Sodium: 143 mmol/L (ref 134–144)
Total Protein: 6.7 g/dL (ref 6.0–8.5)
eGFR: 82 mL/min/{1.73_m2} (ref >59)

## 2022-03-02 LAB — LIPID PANEL
Chol/HDL Ratio: 2.9 ratio (ref 0.0–4.4)
Cholesterol, Total: 170 mg/dL (ref 100–199)
HDL: 58 mg/dL (ref >39)
LDL Chol Calc (NIH): 90 mg/dL (ref 0–99)
Triglycerides: 127 mg/dL (ref 0–149)
VLDL Cholesterol Cal: 22 mg/dL (ref 5–40)

## 2022-03-08 ENCOUNTER — Telehealth: Payer: Self-pay | Admitting: Nurse Practitioner

## 2022-03-08 NOTE — Telephone Encounter (Signed)
Please advise 

## 2022-03-08 NOTE — Telephone Encounter (Signed)
Need more specifics on what she is experiencing

## 2022-03-08 NOTE — Telephone Encounter (Signed)
Left detailed message for patient to contact office so we can give her an appt to come back and check her ears

## 2022-03-09 ENCOUNTER — Encounter: Payer: Self-pay | Admitting: Nurse Practitioner

## 2022-03-09 ENCOUNTER — Ambulatory Visit (INDEPENDENT_AMBULATORY_CARE_PROVIDER_SITE_OTHER): Payer: Medicare HMO | Admitting: Nurse Practitioner

## 2022-03-09 VITALS — BP 140/80 | HR 73 | Temp 97.3°F | Resp 20 | Ht 63.0 in | Wt 126.0 lb

## 2022-03-09 DIAGNOSIS — J0110 Acute frontal sinusitis, unspecified: Secondary | ICD-10-CM | POA: Diagnosis not present

## 2022-03-09 MED ORDER — AMOXICILLIN-POT CLAVULANATE 875-125 MG PO TABS: 1.0000 | ORAL_TABLET | Freq: Two times a day (BID) | ORAL | 0 refills | Status: DC

## 2022-03-09 NOTE — Patient Instructions (Signed)
1. Take meds as prescribed 2. Use a cool mist humidifier especially during the winter months and when heat has been humid. 3. Use saline nose sprays frequently 4. Saline irrigations of the nose can be very helpful if done frequently.  * 4X daily for 1 week*  * Use of a nettie pot can be helpful with this. Follow directions with this* 5. Drink plenty of fluids 6. Keep thermostat turn down low 7.For any cough or congestion- otc meds 8. For fever or aces or pains- take tylenol or ibuprofen appropriate for age and weight.  * for fevers greater than 101 orally you may alternate ibuprofen and tylenol every  3 hours.

## 2022-03-09 NOTE — Progress Notes (Signed)
   Subjective:    Patient ID: Courtney Johnson, female    DOB: Jan 07, 1946, 76 y.o.   MRN: [id number]   Chief Complaint: Sinus Problem and Ear Pain   Sinus Problem Associated symptoms include congestion, ear pain and sinus pressure. Pertinent negatives include no chills, coughing or sore throat.   Patient come sin c/o right ear pain. She had her ears cleaned out last week and right ear is still hurting. She also has facial pressure and congestion that started several days ago.    Review of Systems  Constitutional:  Positive for fatigue. Negative for chills and fever.  HENT:  Positive for congestion, ear pain, sinus pressure and sinus pain. Negative for ear discharge and sore throat.   Respiratory:  Negative for cough.   Cardiovascular: Negative.        Objective:   Physical Exam Vitals reviewed.  Constitutional:      Appearance: Normal appearance.  HENT:     Right Ear: A middle ear effusion (clear) is present.     Left Ear: Tympanic membrane normal.     Nose: Congestion and rhinorrhea present.     Right Sinus: Maxillary sinus tenderness and frontal sinus tenderness present.     Left Sinus: No maxillary sinus tenderness or frontal sinus tenderness.     Mouth/Throat:     Mouth: Mucous membranes are moist.     Pharynx: No oropharyngeal exudate or posterior oropharyngeal erythema.  Cardiovascular:     Rate and Rhythm: Normal rate and regular rhythm.     Heart sounds: Normal heart sounds.  Pulmonary:     Breath sounds: Normal breath sounds.  Neurological:     Mental Status: She is alert.     BP (!) 140/80   Pulse 73   Temp (!) 97.3 F (36.3 C) (Temporal)   Resp 20   Ht 5\' 3"  (1.6 m)   Wt 126 lb (57.2 kg)   SpO2 99%   BMI 22.32 kg/m        Assessment & Plan:  [name] in today with chief complaint of Sinus Problem and Ear Pain   1. Acute non-recurrent frontal sinusitis 1. Take meds as prescribed 2. Use a cool mist humidifier especially during the  winter months and when heat has been humid. 3. Use saline nose sprays frequently 4. Saline irrigations of the nose can be very helpful if done frequently.  * 4X daily for 1 week*  * Use of a nettie pot can be helpful with this. Follow directions with this* 5. Drink plenty of fluids 6. Keep thermostat turn down low 7.For any cough or congestion- OTC cough meds if needed 8. For fever or aces or pains- take tylenol or ibuprofen appropriate for age and weight.  * for fevers greater than 101 orally you may alternate ibuprofen and tylenol every  3 hours.    - amoxicillin-clavulanate (AUGMENTIN) 875-125 MG tablet; Take 1 tablet by mouth 2 (two) times daily.  Dispense: 14 tablet; Refill: 0    The above assessment and management plan was discussed with the patient. The patient verbalized understanding of and has agreed to the management plan. Patient is aware to call the clinic if symptoms persist or worsen. Patient is aware when to return to the clinic for a follow-up visit. Patient educated on when it is appropriate to go to the emergency department.   Mary-Margaret Hassell Done, FNP

## 2022-03-31 ENCOUNTER — Encounter: Payer: Self-pay | Admitting: Nurse Practitioner

## 2022-03-31 ENCOUNTER — Ambulatory Visit (INDEPENDENT_AMBULATORY_CARE_PROVIDER_SITE_OTHER): Payer: Medicare HMO | Admitting: Nurse Practitioner

## 2022-03-31 VITALS — BP 136/92 | HR 93 | Temp 98.6°F | Ht 63.0 in | Wt 127.0 lb

## 2022-03-31 DIAGNOSIS — R300 Dysuria: Secondary | ICD-10-CM | POA: Diagnosis not present

## 2022-03-31 LAB — URINALYSIS
Bilirubin, UA: NEGATIVE
Glucose, UA: NEGATIVE
Ketones, UA: NEGATIVE
Nitrite, UA: NEGATIVE
Protein,UA: NEGATIVE
Specific Gravity, UA: <1.005 — ABNORMAL LOW (ref 1.005–1.030)
Urobilinogen, Ur: 0.2 mg/dL (ref 0.2–1.0)
pH, UA: 6.5 (ref 5.0–7.5)

## 2022-03-31 MED ORDER — CEPHALEXIN 500 MG PO CAPS: 500.0000 mg | ORAL_CAPSULE | Freq: Two times a day (BID) | ORAL | 0 refills | Status: DC

## 2022-03-31 NOTE — Progress Notes (Signed)
Acute Office Visit  Subjective:     Patient ID: Courtney Johnson, female    DOB: [birth year], 76 y.o.   MRN: [id number]  Chief Complaint  Patient presents with   Dysuria    Dysuria  This is a new problem. The current episode started yesterday. The problem occurs every urination. The problem has been unchanged. The quality of the pain is described as aching and burning. The pain is mild. There has been no fever. Associated symptoms include flank pain, frequency and urgency. Pertinent negatives include no chills or discharge. She has tried nothing for the symptoms.    Review of Systems  Constitutional: Negative.  Negative for chills.  Cardiovascular: Negative.   Genitourinary:  Positive for dysuria, flank pain, frequency and urgency.  Skin: Negative.  Negative for itching and rash.  All other systems reviewed and are negative.       Objective:    BP (!) 136/92   Pulse 93   Temp 98.6 F (37 C)   Ht 5\' 3"  (1.6 m)   Wt 127 lb (57.6 kg)   SpO2 98%   BMI 22.50 kg/m  BP Readings from Last 3 Encounters:  03/31/22 (!) 136/92  03/09/22 (!) 140/80  03/01/22 (!) 141/83   Wt Readings from Last 3 Encounters:  03/31/22 127 lb (57.6 kg)  03/09/22 126 lb (57.2 kg)  03/01/22 127 lb (57.6 kg)      Physical Exam Vitals and nursing note reviewed.  Constitutional:      Appearance: Normal appearance.  HENT:     Head: Normocephalic.     Right Ear: External ear normal.     Left Ear: External ear normal.     Nose: Nose normal.     Mouth/Throat:     Mouth: Mucous membranes are moist.     Pharynx: Oropharynx is clear.  Eyes:     Conjunctiva/sclera: Conjunctivae normal.     Pupils: Pupils are equal, round, and reactive to light.  Cardiovascular:     Rate and Rhythm: Normal rate and regular rhythm.     Pulses: Normal pulses.     Heart sounds: Normal heart sounds.  Pulmonary:     Effort: Pulmonary effort is normal.     Breath sounds: Normal breath sounds.  Abdominal:      General: Bowel sounds are normal.     Tenderness: There is right CVA tenderness and left CVA tenderness.  Skin:    General: Skin is warm.     Findings: No erythema or rash.  Neurological:     Mental Status: She is alert and oriented to person, place, and time.  Psychiatric:        Behavior: Behavior normal.     Results for orders placed or performed in visit on 03/31/22  Urinalysis  Result Value Ref Range   Specific Gravity, UA <1.005 (L) 1.005 - 1.030   pH, UA 6.5 5.0 - 7.5   Color, UA Yellow Yellow   Appearance Ur Cloudy (A) Clear   Leukocytes,UA 2+ (A) Negative   Protein,UA Negative Negative/Trace   Glucose, UA Negative Negative   Ketones, UA Negative Negative   RBC, UA Trace (A) Negative   Bilirubin, UA Negative Negative   Urobilinogen, Ur 0.2 0.2 - 1.0 mg/dL   Nitrite, UA Negative Negative        Assessment & Plan:   UTI  vs  interstitial cystitis -urinalysis completed results pending -pyridium for pain -Keflex Precaution and education provided -All questions answered -  follow up with unresolved symptoms  Problem List Items Addressed This Visit   None Visit Diagnoses     Dysuria    -  Primary   Relevant Medications   cephALEXin (KEFLEX) 500 MG capsule   Other Relevant Orders   Urinalysis (Completed)       Meds ordered this encounter  Medications   cephALEXin (KEFLEX) 500 MG capsule    Sig: Take 1 capsule (500 mg total) by mouth 2 (two) times daily.    Dispense:  14 capsule    Refill:  0    Order Specific Question:   Supervising Provider    Answer:   Claretta Fraise [[id number]]    Return if symptoms worsen or fail to improve.  Ivy Lynn, NP

## 2022-03-31 NOTE — Patient Instructions (Signed)

## 2022-04-19 ENCOUNTER — Ambulatory Visit: Payer: Medicare HMO | Admitting: Family Medicine

## 2022-04-27 ENCOUNTER — Ambulatory Visit (INDEPENDENT_AMBULATORY_CARE_PROVIDER_SITE_OTHER): Payer: Medicare HMO | Admitting: Nurse Practitioner

## 2022-04-27 ENCOUNTER — Encounter: Payer: Self-pay | Admitting: Nurse Practitioner

## 2022-04-27 DIAGNOSIS — R102 Pelvic and perineal pain: Secondary | ICD-10-CM | POA: Diagnosis not present

## 2022-04-27 NOTE — Progress Notes (Signed)
   Virtual Visit  Note Due to COVID-19 pandemic this visit was conducted virtually. This visit type was conducted due to national recommendations for restrictions regarding the COVID-19 Pandemic (e.g. social distancing, sheltering in place) in an effort to limit this patient's exposure and mitigate transmission in our community. All issues noted in this document were discussed and addressed.  A physical exam was not performed with this format.  I connected with Courtney Johnson on 04/27/22 at 2:05 by telephone and verified that I am speaking with the correct person using two identifiers. Courtney Johnson is currently located at home and no one  is currently with her during visit. The provider, Mary-Margaret Hassell Done, FNP is located in their office at time of visit.  I discussed the limitations, risks, security and privacy concerns of performing an evaluation and management service by telephone and the availability of in person appointments. I also discussed with the patient that there may be a patient responsible charge related to this service. The patient expressed understanding and agreed to proceed.   History and Present Illness:  Lower abdominal pain  Pelvic Pain The patient's primary symptoms include pelvic pain. This is a new problem. The current episode started in the past 7 days. The problem has been waxing and waning. The pain is moderate. The problem affects both sides. Associated symptoms include abdominal pain and back pain. Pertinent negatives include no constipation, diarrhea or urgency. Nothing aggravates the symptoms. She has tried acetaminophen for the symptoms. The treatment provided no relief.      Review of Systems  Gastrointestinal:  Positive for abdominal pain. Negative for constipation and diarrhea.  Genitourinary:  Positive for pelvic pain. Negative for urgency.  Musculoskeletal:  Positive for back pain.     Observations/Objective: Alert and oriented- answers all  questions appropriately No distress Currently not hurting  Assessment and Plan: Courtney Johnson in today with chief complaint of No chief complaint on file.   1. Pelvic pain Needs face to face visit- refuses to go to the ED or Urgent care Wants to wait until THursday to be seen- If worsens go tp the ED   Follow Up Instructions: thursday    I discussed the assessment and treatment plan with the patient. The patient was provided an opportunity to ask questions and all were answered. The patient agreed with the plan and demonstrated an understanding of the instructions.   The patient was advised to call back or seek an in-person evaluation if the symptoms worsen or if the condition fails to improve as anticipated.  The above assessment and management plan was discussed with the patient. The patient verbalized understanding of and has agreed to the management plan. Patient is aware to call the clinic if symptoms persist or worsen. Patient is aware when to return to the clinic for a follow-up visit. Patient educated on when it is appropriate to go to the emergency department.   Time call ended:  2:18  I provided 13 minutes of  non face-to-face time during this encounter.    Mary-Margaret Hassell Done, FNP

## 2022-04-27 NOTE — Patient Instructions (Signed)
Pelvic Pain, Female Pelvic pain is pain in your lower abdomen, below your belly button and between your hips. The pain may start suddenly (be acute), keep coming back (be recurring), or last a long time (become chronic). Pelvic pain that lasts longer than 6 months is considered chronic. Pelvic pain may affect your: Reproductive organs. Urinary system. Digestive tract. Musculoskeletal system. There are many potential causes of pelvic pain. Sometimes, the pain can be a result of digestive or urinary conditions, strained muscles or ligaments, or reproductive conditions. Sometimes the cause of pelvic pain is not known. Follow these instructions at home:  Take over-the-counter and prescription medicines only as told by your health care provider. Rest as told by your health care provider. Do not have sex if it hurts. Keep a journal of your pelvic pain. Write down: When the pain started. Where the pain is located. What seems to make the pain better or worse, such as food or your monthly period (menstrual cycle). Any symptoms you have along with the pain. Keep all follow-up visits. This is important. Contact a health care provider if: Medicine does not help your pain, or your pain comes back. You have new symptoms. You have abnormal vaginal discharge or bleeding, including bleeding after menopause. You have a fever or chills. You are constipated. You have blood in your urine or stool (feces). You have foul-smelling urine. You feel weak or light-headed. Get help right away if: You have sudden severe pain. Your pain gets steadily worse. You have severe pain along with fever, nausea, vomiting, or excessive sweating. You lose consciousness. These symptoms may represent a serious problem that is an emergency. Do not wait to see if the symptoms will go away. Get medical help right away. Call your local emergency services (911 in the [place] Do not drive yourself to the hospital. Summary Pelvic  pain is pain in your lower abdomen, below your belly button and between your hips. There are many potential causes of pelvic pain. Keep a journal of your pelvic pain. This information is not intended to replace advice given to you by your health care provider. Make sure you discuss any questions you have with your health care provider. Document Revised: 10/14/2020 Document Reviewed: 10/14/2020 Elsevier Patient Education  2023 [employer]  

## 2022-04-29 ENCOUNTER — Ambulatory Visit (INDEPENDENT_AMBULATORY_CARE_PROVIDER_SITE_OTHER): Payer: Medicare HMO | Admitting: Nurse Practitioner

## 2022-04-29 ENCOUNTER — Ambulatory Visit (INDEPENDENT_AMBULATORY_CARE_PROVIDER_SITE_OTHER): Payer: Medicare HMO

## 2022-04-29 ENCOUNTER — Encounter: Payer: Self-pay | Admitting: Nurse Practitioner

## 2022-04-29 VITALS — BP 148/94 | HR 101 | Temp 97.5°F | Resp 20 | Ht 63.0 in | Wt 128.0 lb

## 2022-04-29 DIAGNOSIS — K5901 Slow transit constipation: Secondary | ICD-10-CM

## 2022-04-29 DIAGNOSIS — R109 Unspecified abdominal pain: Secondary | ICD-10-CM | POA: Diagnosis not present

## 2022-04-29 DIAGNOSIS — R102 Pelvic and perineal pain: Secondary | ICD-10-CM

## 2022-04-29 LAB — URINALYSIS, COMPLETE
Bilirubin, UA: NEGATIVE
Glucose, UA: NEGATIVE
Ketones, UA: NEGATIVE
Nitrite, UA: NEGATIVE
Protein,UA: NEGATIVE
RBC, UA: NEGATIVE
Specific Gravity, UA: <1.005 — ABNORMAL LOW (ref 1.005–1.030)
Urobilinogen, Ur: 0.2 mg/dL (ref 0.2–1.0)
pH, UA: 6.5 (ref 5.0–7.5)

## 2022-04-29 LAB — MICROSCOPIC EXAMINATION
RBC, Urine: NONE SEEN /hpf (ref 0–2)
Renal Epithel, UA: NONE SEEN /hpf

## 2022-04-29 NOTE — Progress Notes (Signed)
   Subjective:    Patient ID: Courtney Johnson, female    DOB: [birth year], 76 y.o.   MRN: [id number]   Chief Complaint: Pelvic Pain  Patient comes in today c/o  pelvic pain. Has been having intermittently for several weeks. She had blood in her urine a few weeks ago and was treated with antibiotic  Pelvic Pain The patient's primary symptoms include pelvic pain. This is a new problem. The current episode started more than 1 month ago. The problem occurs intermittently. The problem has been waxing and waning. The pain is mild. The problem affects both sides. She is not pregnant. Pertinent negatives include no abdominal pain, constipation, diarrhea, flank pain, nausea or painful intercourse. She has tried nothing for the symptoms. The treatment provided no relief. She is postmenopausal.      Review of Systems  Gastrointestinal:  Negative for abdominal pain, constipation, diarrhea and nausea.  Genitourinary:  Positive for pelvic pain. Negative for flank pain.       Objective:   Physical Exam Constitutional:      Appearance: Normal appearance.  Cardiovascular:     Rate and Rhythm: Normal rate and regular rhythm.     Heart sounds: Normal heart sounds.  Pulmonary:     Effort: Pulmonary effort is normal.     Breath sounds: Normal breath sounds.  Abdominal:     General: Abdomen is flat. Bowel sounds are normal.     Tenderness: There is abdominal tenderness (mild pelvic).  Skin:    General: Skin is warm.  Neurological:     General: No focal deficit present.     Mental Status: She is alert and oriented to person, place, and time.  Psychiatric:        Mood and Affect: Mood normal.        Behavior: Behavior normal.    BP (!) 148/94   Pulse (!) 101   Temp (!) 97.5 F (36.4 C) (Temporal)   Resp 20   Ht 5\' 3"  (1.6 m)   Wt 128 lb (58.1 kg)   SpO2 98%   BMI 22.67 kg/m   KUB- moderate stool burden throughout- Preliminary reading by , FNP  [facility]  UA clear        Assessment & Plan:  [facility] in today with chief complaint of Pelvic Pain   1. Pelvic pain - DG Abd 1 View - Urinalysis, Complete  2. Slow transit constipation Milk of magnesia and prune juice Increase fiber in diet Miralax daily in applejuice Keep follow up appointment as scheduled    The above assessment and management plan was discussed with the patient. The patient verbalized understanding of and has agreed to the management plan. Patient is aware to call the clinic if symptoms persist or worsen. Patient is aware when to return to the clinic for a follow-up visit. Patient educated on when it is appropriate to go to the emergency department.   Mary-Margaret [name], FNP

## 2022-04-29 NOTE — Patient Instructions (Signed)

## 2022-05-17 ENCOUNTER — Telehealth: Payer: Self-pay | Admitting: Nurse Practitioner

## 2022-05-17 DIAGNOSIS — R1084 Generalized abdominal pain: Secondary | ICD-10-CM

## 2022-05-17 NOTE — Telephone Encounter (Signed)
Will need CT scan- where would she like to go?

## 2022-05-17 NOTE — Telephone Encounter (Signed)
Do you want patient to be seen again?

## 2022-05-17 NOTE — Telephone Encounter (Signed)
Patient is still having pelvic pain, offered multiple appointments both with PCP and with other providers. She did not want one and wanted a message put in. Please call back

## 2022-05-17 NOTE — Telephone Encounter (Signed)
Patient does not have a preference of where she goes.

## 2022-05-31 ENCOUNTER — Telehealth: Payer: Self-pay | Admitting: Nurse Practitioner

## 2022-05-31 NOTE — Telephone Encounter (Signed)
Patient notified and verbalized understanding. 

## 2022-05-31 NOTE — Telephone Encounter (Signed)
Pt scheduled to have CT on 12/14. Wants to know if MMM can get it pushed up so she can have it done now?

## 2022-05-31 NOTE — Telephone Encounter (Signed)
Probably not- will have to wait and do on 12/14

## 2022-06-03 ENCOUNTER — Ambulatory Visit (HOSPITAL_COMMUNITY)
Admission: RE | Admit: 2022-06-03 | Discharge: 2022-06-03 | Disposition: A | Discharge status: Home / Self Care | Payer: Medicare HMO | Source: Ambulatory Visit | Attending: Nurse Practitioner | Admitting: Nurse Practitioner

## 2022-06-03 DIAGNOSIS — R1084 Generalized abdominal pain: Secondary | ICD-10-CM | POA: Insufficient documentation

## 2022-06-03 DIAGNOSIS — R109 Unspecified abdominal pain: Secondary | ICD-10-CM | POA: Diagnosis not present

## 2022-06-03 DIAGNOSIS — I700 Atherosclerosis of aorta: Secondary | ICD-10-CM | POA: Diagnosis not present

## 2022-06-03 MED ORDER — IOHEXOL 300 MG/ML  SOLN
100.0000 mL | Freq: Once | INTRAMUSCULAR | Status: AC | PRN
  Administered 2022-06-03: 100 mL via INTRAVENOUS

## 2022-06-03 MED ORDER — SODIUM CHLORIDE (PF) 0.9 % IJ SOLN
INTRAMUSCULAR | Status: AC
Start: 2022-06-03 — End: 2022-06-03
  Filled 2022-06-03: qty 50

## 2022-06-07 DIAGNOSIS — Z1231 Encounter for screening mammogram for malignant neoplasm of breast: Secondary | ICD-10-CM | POA: Diagnosis not present

## 2022-06-09 ENCOUNTER — Telehealth: Payer: Self-pay | Admitting: Nurse Practitioner

## 2022-06-09 NOTE — Telephone Encounter (Signed)
No CT evidence for acute intra-abdominal or pelvic abnormality. 2. Gallstones. Prominent extrahepatic common bile duct and pancreatic duct. Suggest correlation with LFTs and consider further assessment with MRCP. 3. Diverticular disease of the colon without acute wall thickening 4. Aortic atherosclerosis.   - gall stones not causing pain she is having- if develops RUQ pain then we will address Diverticular disease of colon- but ni infection or inflammation

## 2022-06-10 NOTE — Telephone Encounter (Signed)
Pt returning call. Wants to speak with nurse to discuss results.

## 2022-06-24 ENCOUNTER — Ambulatory Visit: Payer: Medicare HMO | Admitting: Nurse Practitioner

## 2022-06-24 NOTE — Telephone Encounter (Signed)
Ct showed no acute findings She does have gall stones but her pain is not in RUQ- if starts having issues will do referral Diverticular disease - but not inflammed at this time  [place], FNP

## 2022-06-24 NOTE — Telephone Encounter (Signed)
Pt has been notified.

## 2022-06-24 NOTE — Telephone Encounter (Signed)
Have you reviewed this CT?

## 2022-07-01 ENCOUNTER — Ambulatory Visit: Payer: Medicare HMO | Admitting: Nurse Practitioner

## 2022-07-02 ENCOUNTER — Ambulatory Visit (INDEPENDENT_AMBULATORY_CARE_PROVIDER_SITE_OTHER): Payer: Medicare HMO | Admitting: Nurse Practitioner

## 2022-07-02 ENCOUNTER — Encounter: Payer: Self-pay | Admitting: Nurse Practitioner

## 2022-07-02 VITALS — BP 146/81 | HR 75 | Temp 97.6°F | Resp 20 | Ht 63.0 in | Wt 125.0 lb

## 2022-07-02 DIAGNOSIS — K8020 Calculus of gallbladder without cholecystitis without obstruction: Secondary | ICD-10-CM | POA: Diagnosis not present

## 2022-07-02 DIAGNOSIS — Z712 Person consulting for explanation of examination or test findings: Secondary | ICD-10-CM

## 2022-07-02 NOTE — Patient Instructions (Signed)
Cholelithiasis  Cholelithiasis is a disease in which gallstones form in the gallbladder. The gallbladder is an organ that stores bile. Bile is a fluid that helps to digest fats. Gallstones begin as small crystals and can slowly grow into stones. They may cause no symptoms until they block the gallbladder duct, or cystic duct, when the gallbladder tightens (contracts) after food is eaten. This can cause pain and is known as a gallbladder attack, or biliary colic. There are two main types of gallstones: Cholesterol stones. These are the most common type of gallstone. These stones are made of hardened cholesterol and are usually yellow-green in color. Cholesterol is a fat-like substance that is made in the liver. Pigment stones. These are dark in color and are made of a red-yellow substance, called bilirubin,that forms when hemoglobin from red blood cells breaks down. What are the causes? This condition may be caused by an imbalance in the different parts that make bile. This can happen if the bile: Has too much bilirubin. This can happen in certain blood diseases, such as sickle cell anemia. Has too much cholesterol. Does not have enough bile salts. These salts help the body absorb and digest fats. In some cases, this condition can also be caused by the gallbladder not emptying completely or often enough. This is common during pregnancy. What increases the risk? The following factors may make you more likely to develop this condition: Being female. Having multiple pregnancies. Health care providers sometimes advise removing diseased gallbladders before future pregnancies. Eating a diet that is heavy in fried foods, fat, and refined carbohydrates, such as white bread and white rice. Being obese. Being older than age 40. Using medicines that contain female hormones (estrogen) for a long time. Losing weight quickly. Having a family history of gallstones. Having certain medical problems, such  as: Diabetes mellitus. Cystic fibrosis. Crohn's disease. Cirrhosis or other long-term (chronic) liver disease. Certain blood diseases, such as sickle cell anemia or leukemia. What are the signs or symptoms? In many cases, having gallstones causes no symptoms. When you have gallstones but do not have symptoms, you have silent gallstones. If a gallstone blocks your bile duct, it can cause a gallbladder attack. The main symptom of a gallbladder attack is sudden pain in the upper right part of the abdomen. The pain: Usually comes at night or after eating. Can last for one hour or more. Can spread to your right shoulder, back, or chest. Can feel like indigestion. This is discomfort, burning, or fullness in your upper abdomen. If the bile duct is blocked for more than a few hours, it can cause an infection or inflammation of your gallbladder (cholecystitis), liver, or pancreas. This can cause: Nausea or vomiting. Bloating. Pain in your abdomen that lasts for 5 hours or longer. Tenderness in your upper abdomen, often in the upper right section and under your rib cage. Fever or chills. Skin or the white parts of your eyes turning yellow (jaundice). This usually happens when a stone has blocked bile from passing through the common bile duct. Dark urine or light-colored stools. How is this diagnosed? This condition may be diagnosed based on: A physical exam. Your medical history. Ultrasound. CT scan. MRI. You may also have other tests, including: Blood tests to check for signs of an infection or inflammation. Cholescintigraphy, or HIDA scan. This is a scan of your gallbladder and bile ducts (biliary system) using non-harmful radioactive material and special cameras that can see the radioactive material. Endoscopic retrograde cholangiopancreatogram. This   involves inserting a small tube with a camera on the end (endoscope) through your mouth to look at bile ducts and check for blockages. How is  this treated? Treatment for this condition depends on the severity of the condition. Silent gallstones do not need treatment. Treatment may be needed if a blockage causes a gallbladder attack or other symptoms. Treatment may include: Home care, if symptoms are not severe. During a simple gallbladder attack, stop eating and drinking for 12-24 hours (except for water and clear liquids). This helps to "cool down" your gallbladder. After 1 or 2 days, you can start to eat a diet of simple or clear foods, such as broths and crackers. You may also need medicines for pain or nausea or both. If you have cholecystitis and an infection, you will need antibiotics. A hospital stay, if needed for pain control or for cholecystitis with severe infection. Cholecystectomy, or surgery to remove your gallbladder. This is the most common treatment if all other treatments have not worked. Medicines to break up gallstones. These are most effective at treating small gallstones. Medicines may be used for up to 6-12 months. Endoscopic retrograde cholangiopancreatogram. A small basket can be attached to the endoscope and used to capture and remove gallstones, mainly those that are in the common bile duct. Follow these instructions at home: Medicines Take over-the-counter and prescription medicines only as told by your health care provider. If you were prescribed an antibiotic medicine, take it as told by your health care provider. Do not stop taking the antibiotic even if you start to feel better. Ask your health care provider if the medicine prescribed to you requires you to avoid driving or using machinery. Eating and drinking Drink enough fluid to keep your urine pale yellow. This is important during a gallbladder attack. Water and clear liquids are preferred. Follow a healthy diet. This includes: Reducing fatty foods, such as fried food and foods high in cholesterol. Reducing refined carbohydrates, such as white bread  and white rice. Eating more fiber. Aim for foods such as almonds, fruit, and beans. Alcohol use If you drink alcohol: Limit how much you use to: 0-1 drink a day for nonpregnant women. 0-2 drinks a day for men. Be aware of how much alcohol is in your drink. In the [place] one drink equals one 12 oz bottle of beer (355 mL), one 5 oz glass of wine (148 mL), or one 1 oz glass of hard liquor (44 mL). General instructions Do not use any products that contain nicotine or tobacco, such as cigarettes, e-cigarettes, and chewing tobacco. If you need help quitting, ask your health care provider. Maintain a healthy weight. Keep all follow-up visits as told by your health care provider. These may include consultations with a surgeon or specialist. This is important. Where to find more information National Institute of Diabetes and Digestive and Kidney Diseases: www.niddk.nih.gov Contact a health care provider if: You think you have had a gallbladder attack. You have been diagnosed with silent gallstones and you develop pain in your abdomen or indigestion. You begin to have attacks more often. You have dark urine or light-colored stools. Get help right away if: You have pain from a gallbladder attack that lasts for more than 2 hours. You have pain in your abdomen that lasts for more than 5 hours or is getting worse. You have a fever or chills. You have nausea and vomiting that do not go away. You develop jaundice. Summary Cholelithiasis is a disease in which   gallstones form in the gallbladder. This condition may be caused by an imbalance in the different parts that make bile. This can happen if your bile has too much bilirubin or cholesterol, or does not have enough bile salts. Treatment for gallstones depends on the severity of the condition. Silent gallstones do not need treatment. If gallstones cause a gallbladder attack or other symptoms, treatment usually involves not eating or drinking anything.  Treatment may also include pain medicines and antibiotics, and it sometimes includes a hospital stay. Surgery to remove the gallbladder is common if all other treatments have not worked. This information is not intended to replace advice given to you by your health care provider. Make sure you discuss any questions you have with your health care provider. Document Revised: 04/30/2019 Document Reviewed: 04/30/2019 Elsevier Patient Education  2023 Elsevier Inc.  

## 2022-07-02 NOTE — Progress Notes (Signed)
Subjective:    Patient ID: Courtney Johnson, female    DOB: 07/28/1945, 77 y.o.   MRN: [id number]   Chief Complaint: Follow up CT Scan   HPI Patient has been having LLQ pain off and on for several months. Ct scan was ordered which showed:  1. No CT evidence for acute intra-abdominal or pelvic abnormality. 2. Gallstones. Prominent extrahepatic common bile duct and pancreatic duct. Suggest correlation with LFTs and consider further assessment with MRCP. 3. Diverticular disease of the colon without acute wall thickening 4. Aortic atherosclerosis  She denies any recent pain.  Review of Systems  Constitutional:  Negative for diaphoresis.  Eyes:  Negative for pain.  Respiratory:  Negative for shortness of breath.   Cardiovascular:  Negative for chest pain, palpitations and leg swelling.  Gastrointestinal:  Negative for abdominal pain.  Endocrine: Negative for polydipsia.  Skin:  Negative for rash.  Neurological:  Negative for dizziness, weakness and headaches.  Hematological:  Does not bruise/bleed easily.  All other systems reviewed and are negative.      Objective:   Physical Exam Vitals and nursing note reviewed.  Constitutional:      General: She is not in acute distress.    Appearance: Normal appearance. She is well-developed.  HENT:     Head: Normocephalic.     Right Ear: Tympanic membrane normal.     Left Ear: Tympanic membrane normal.     Nose: Nose normal.     Mouth/Throat:     Mouth: Mucous membranes are moist.  Eyes:     Pupils: Pupils are equal, round, and reactive to light.  Neck:     Vascular: No carotid bruit or JVD.  Cardiovascular:     Rate and Rhythm: Normal rate and regular rhythm.     Heart sounds: Normal heart sounds.  Pulmonary:     Effort: Pulmonary effort is normal. No respiratory distress.     Breath sounds: Normal breath sounds. No wheezing or rales.  Chest:     Chest wall: No tenderness.  Abdominal:     General: Bowel sounds are  normal. There is no distension or abdominal bruit.     Palpations: Abdomen is soft. There is no hepatomegaly, splenomegaly, mass or pulsatile mass.     Tenderness: There is no abdominal tenderness.  Musculoskeletal:        General: Normal range of motion.     Cervical back: Normal range of motion and neck supple.  Lymphadenopathy:     Cervical: No cervical adenopathy.  Skin:    General: Skin is warm and dry.  Neurological:     Mental Status: She is alert and oriented to person, place, and time.     Deep Tendon Reflexes: Reflexes are normal and symmetric.  Psychiatric:        Behavior: Behavior normal.        Thought Content: Thought content normal.        Judgment: Judgment normal.    BP (!) 146/81   Pulse 75   Temp 97.6 F (36.4 C) (Temporal)   Resp 20   Ht 5\' 3"  (1.6 m)   Wt 125 lb (56.7 kg)   SpO2 99%   BMI 22.14 kg/m         Assessment & Plan:   [name] in today with chief complaint of Follow up CT Scan   1. Encounter to discuss x-ray results Discussed gall stones- she has no RUQ pan at this time will  do further work up if becomes symptomatic.    The above assessment and management plan was discussed with the patient. The patient verbalized understanding of and has agreed to the management plan. Patient is aware to call the clinic if symptoms persist or worsen. Patient is aware when to return to the clinic for a follow-up visit. Patient educated on when it is appropriate to go to the emergency department.   Mary-Margaret Hassell Done, FNP

## 2022-08-04 ENCOUNTER — Encounter: Payer: Self-pay | Admitting: Family Medicine

## 2022-08-04 ENCOUNTER — Telehealth (INDEPENDENT_AMBULATORY_CARE_PROVIDER_SITE_OTHER): Payer: Medicare HMO | Admitting: Family Medicine

## 2022-08-04 DIAGNOSIS — R062 Wheezing: Secondary | ICD-10-CM

## 2022-08-04 LAB — VERITOR FLU A/B WAIVED
Influenza A: NEGATIVE
Influenza B: NEGATIVE

## 2022-08-04 LAB — RSV AG, IMMUNOCHR, WAIVED: RSV Ag, Immunochr, Waived: NEGATIVE

## 2022-08-04 MED ORDER — ALBUTEROL SULFATE HFA 108 (90 BASE) MCG/ACT IN AERS: 2.0000 | INHALATION_SPRAY | Freq: Four times a day (QID) | RESPIRATORY_TRACT | 0 refills | Status: DC | PRN

## 2022-08-04 MED ORDER — PREDNISONE 10 MG (21) PO TBPK: ORAL_TABLET | ORAL | 0 refills | Status: DC

## 2022-08-04 NOTE — Progress Notes (Signed)
Telephone visit  Subjective: CC: Wheezing PCP: Chevis Pretty, FNP [id number] Courtney Johnson is Courtney 77 y.o. female calls for telephone consult today. Patient provides verbal consent for consult held via phone.  Due to COVID-19 pandemic this visit was conducted virtually. This visit type was conducted due to national recommendations for restrictions regarding the COVID-19 Pandemic (e.g. social distancing, sheltering in place) in an effort to limit this patient's exposure and mitigate transmission in our community. All issues noted in this document were discussed and addressed.  Courtney physical exam was not performed with this format.   Location of patient: home Location of provider: [facility] Others present for call: none  1. Wheezing Patient reports that yesterday she started getting congested in her chest and started Mucinex.  She reports hoarseness and wheezing.  No fevers. No bloody/ brown sputum.  Never been Courtney smoker.  No history of asthma.  Multiple sick contacts.  Has not tested for COVID-19.  No medications reported thus far.  ROS: Per HPI  No Known Allergies Past Medical History:  Diagnosis Date   Allergy    Anxiety    not much sleep / stress    Arthritis    Cataract    GERD (gastroesophageal reflux disease)    Hyperlipidemia    Hypertension    elevated at Dr Gabriel Carina = not high at home    Osteoporosis     Current Outpatient Medications:    ALPRAZolam (XANAX) 0.25 MG tablet, Take 1 tablet (0.25 mg total) by mouth 2 (two) times daily as needed for anxiety., Disp: 60 tablet, Rfl: 2   Cholecalciferol (VITAMIN D) 50 MCG (2000 UT) tablet, Take 2,000 Units by mouth daily., Disp: , Rfl:    pantoprazole (PROTONIX) 40 MG tablet, Take 1 tablet (40 mg total) by mouth daily., Disp: 90 tablet, Rfl: 1   phenylephrine (,USE FOR PREPARATION-H,) 0.25 % suppository, Place 1 suppository rectally as needed for hemorrhoids., Disp: , Rfl:    simvastatin (ZOCOR) 40 MG tablet, Take 1 tablet (40 mg total)  by mouth daily., Disp: 90 tablet, Rfl: 1  Assessment/ Plan: 77 y.o. female   Wheezing - Plan: predniSONE (STERAPRED UNI-PAK 21 TAB) 10 MG (21) TBPK tablet, albuterol (VENTOLIN HFA) 108 (90 Base) MCG/ACT inhaler, COVID-19, Flu Courtney+B and RSV, RSV Ag, Immunochr, Waived, Veritor Flu Courtney/B Waived  Suspect viral etiology.  I discussed with her that an antibiotic is not appropriate at this time as she is not demonstrating any signs or symptoms suggestive of secondary bacterial infection.  Given reports of wheezing I am glad to oblige Courtney prednisone Dosepak and albuterol inhaler.  We will test her for etiology of her symptoms but so far rapid testing has been negative.  If she is positive for COVID she is Courtney candidate for COVID antivirals given >65yo.  We discussed signs and symptoms of secondary bacterial infection which would warrant reevaluation.  I offered cough medication but she declined this as OTC cough drops are working for her  Start time: 1:05pm End time: 1:11p  Total time spent on patient care (including telephone call/ virtual visit): 6 minutes  Courtney Johnson, Courtney Johnson [phone]

## 2022-08-04 NOTE — Patient Instructions (Signed)

## 2022-08-06 LAB — COVID-19, FLU A+B AND RSV
Influenza A, NAA: NOT DETECTED
Influenza B, NAA: NOT DETECTED
RSV, NAA: NOT DETECTED
SARS-CoV-2, NAA: NOT DETECTED

## 2022-08-30 ENCOUNTER — Ambulatory Visit (INDEPENDENT_AMBULATORY_CARE_PROVIDER_SITE_OTHER): Payer: Medicare HMO

## 2022-08-30 ENCOUNTER — Encounter: Payer: Self-pay | Admitting: Nurse Practitioner

## 2022-08-30 ENCOUNTER — Ambulatory Visit (INDEPENDENT_AMBULATORY_CARE_PROVIDER_SITE_OTHER): Payer: Medicare HMO | Admitting: Nurse Practitioner

## 2022-08-30 VITALS — BP 132/88 | HR 84 | Temp 97.5°F | Resp 20 | Ht 63.0 in | Wt 127.0 lb

## 2022-08-30 DIAGNOSIS — Z0001 Encounter for general adult medical examination with abnormal findings: Secondary | ICD-10-CM

## 2022-08-30 DIAGNOSIS — Z136 Encounter for screening for cardiovascular disorders: Secondary | ICD-10-CM | POA: Diagnosis not present

## 2022-08-30 DIAGNOSIS — Z0000 Encounter for general adult medical examination without abnormal findings: Secondary | ICD-10-CM

## 2022-08-30 DIAGNOSIS — J9811 Atelectasis: Secondary | ICD-10-CM | POA: Diagnosis not present

## 2022-08-30 DIAGNOSIS — F411 Generalized anxiety disorder: Secondary | ICD-10-CM | POA: Diagnosis not present

## 2022-08-30 DIAGNOSIS — M810 Age-related osteoporosis without current pathological fracture: Secondary | ICD-10-CM

## 2022-08-30 DIAGNOSIS — K219 Gastro-esophageal reflux disease without esophagitis: Secondary | ICD-10-CM | POA: Diagnosis not present

## 2022-08-30 DIAGNOSIS — E782 Mixed hyperlipidemia: Secondary | ICD-10-CM | POA: Diagnosis not present

## 2022-08-30 MED ORDER — PANTOPRAZOLE SODIUM 40 MG PO TBEC: 40.0000 mg | DELAYED_RELEASE_TABLET | Freq: Every day | ORAL | 1 refills | Status: DC

## 2022-08-30 MED ORDER — ALPRAZOLAM 0.25 MG PO TABS: 0.2500 mg | ORAL_TABLET | Freq: Two times a day (BID) | ORAL | 2 refills | Status: DC | PRN

## 2022-08-30 MED ORDER — SIMVASTATIN 40 MG PO TABS: 40.0000 mg | ORAL_TABLET | Freq: Every day | ORAL | 1 refills | Status: DC

## 2022-08-30 NOTE — Patient Instructions (Signed)
Fall Prevention in the Home, Adult Falls can cause injuries and can happen to people of all ages. There are many things you can do to make your home safer and to help prevent falls. What actions can I take to prevent falls? General information Use good lighting in all rooms. Make sure to: Replace any light bulbs that burn out. Turn on the lights in dark areas and use night-lights. Keep items that you use often in easy-to-reach places. Lower the shelves around your home if needed. Move furniture so that there are clear paths around it. Do not use throw rugs or other things on the floor that can make you trip. If any of your floors are uneven, fix them. Add color or contrast paint or tape to clearly mark and help you see: Grab bars or handrails. First and last steps of staircases. Where the edge of each step is. If you use a ladder or stepladder: Make sure that it is fully opened. Do not climb a closed ladder. Make sure the sides of the ladder are locked in place. Have someone hold the ladder while you use it. Know where your pets are as you move through your home. What can I do in the bathroom?     Keep the floor dry. Clean up any water on the floor right away. Remove soap buildup in the bathtub or shower. Buildup makes bathtubs and showers slippery. Use non-skid mats or decals on the floor of the bathtub or shower. Attach bath mats securely with double-sided, non-slip rug tape. If you need to sit down in the shower, use a non-slip stool. Install grab bars by the toilet and in the bathtub and shower. Do not use towel bars as grab bars. What can I do in the bedroom? Make sure that you have a light by your bed that is easy to reach. Do not use any sheets or blankets on your bed that hang to the floor. Have a firm chair or bench with side arms that you can use for support when you get dressed. What can I do in the kitchen? Clean up any spills right away. If you need to reach something  above you, use a step stool with a grab bar. Keep electrical cords out of the way. Do not use floor polish or wax that makes floors slippery. What can I do with my stairs? Do not leave anything on the stairs. Make sure that you have a light switch at the top and the bottom of the stairs. Make sure that there are handrails on both sides of the stairs. Fix handrails that are broken or loose. Install non-slip stair treads on all your stairs if they do not have carpet. Avoid having throw rugs at the top or bottom of the stairs. Choose a carpet that does not hide the edge of the steps on the stairs. Make sure that the carpet is firmly attached to the stairs. Fix carpet that is loose or worn. What can I do on the outside of my home? Use bright outdoor lighting. Fix the edges of walkways and driveways and fix any cracks. Clear paths of anything that can make you trip, such as tools or rocks. Add color or contrast paint or tape to clearly mark and help you see anything that might make you trip as you walk through a door, such as a raised step or threshold. Trim any bushes or trees on paths to your home. Check to see if handrails are loose   or broken and that both sides of all steps have handrails. Install guardrails along the edges of any raised decks and porches. Have leaves, snow, or ice cleared regularly. Use sand, salt, or ice melter on paths if you live where there is ice and snow during the winter. Clean up any spills in your garage right away. This includes grease or oil spills. What other actions can I take? Review your medicines with your doctor. Some medicines can cause dizziness or changes in blood pressure, which increase your risk of falling. Wear shoes that: Have a low heel. Do not wear high heels. Have rubber bottoms and are closed at the toe. Feel good on your feet and fit well. Use tools that help you move around if needed. These include: Canes. Walkers. Scooters. Crutches. Ask  your doctor what else you can do to help prevent falls. This may include seeing a physical therapist to learn to do exercises to move better and get stronger. Where to find more information Centers for Disease Control and Prevention, [employer]: cdc.gov National Institute on Aging: nia.nih.gov National Institute on Aging: nia.nih.gov Contact a doctor if: You are afraid of falling at home. You feel weak, drowsy, or dizzy at home. You fall at home. Get help right away if you: Lose consciousness or have trouble moving after a fall. Have a fall that causes a head injury. These symptoms may be an emergency. Get help right away. Call 911. Do not wait to see if the symptoms will go away. Do not drive yourself to the hospital. This information is not intended to replace advice given to you by your health care provider. Make sure you discuss any questions you have with your health care provider. Document Revised: 02/08/2022 Document Reviewed: 02/08/2022 Elsevier Patient Education  2023 [employer] Inc.  

## 2022-08-30 NOTE — Progress Notes (Signed)
Subjective:    Patient ID: Courtney Johnson, female    DOB: [birth year], 77 y.o.   MRN: [id number]   Chief Complaint: .  HPI  Chief Complaint: annual physical    HPI:  Courtney Johnson is a 77 y.o. who identifies as a female who was assigned female at birth.   Social history: Lives with: husband Work history: retired   Scientist, forensic in today for follow up of the following chronic medical issues:  1. Annual physical exam  2. Mixed hyperlipidemia Does try to watch diet but does very little exercise. Lab Results  Component Value Date   CHOL 170 03/01/2022   HDL 58 03/01/2022   LDLCALC 90 03/01/2022   TRIG 127 03/01/2022   CHOLHDL 2.9 03/01/2022     3. Gastroesophageal reflux disease, unspecified whether esophagitis present Is on protonix daily and is doing well.  4. Generalized anxiety disorder Is on xanax and is dong well.     08/30/2022   10:42 AM 07/02/2022    9:18 AM 04/29/2022   11:30 AM 03/01/2022   11:38 AM  GAD 7 : Generalized Anxiety Score  Nervous, Anxious, on Edge 1 0 0 0  Control/stop worrying 0 0 0 0  Worry too much - different things 1 0 0 0  Trouble relaxing 0 0 0 0  Restless 0 0 0 0  Easily annoyed or irritable 0 0 0 0  Afraid - awful might happen 0 0 0 0  Total GAD 7 Score 2 0 0 0  Anxiety Difficulty Not difficult at all Not difficult at all Not difficult at all Not difficult at all      5. Age-related osteoporosis without current pathological fracture Last dexascan was done in 08/26/20. Her t score was -2.0   New complaints: None today  No Known Allergies Outpatient Encounter Medications as of 08/30/2022  Medication Sig   albuterol (VENTOLIN HFA) 108 (90 Base) MCG/ACT inhaler Inhale 2 puffs into the lungs every 6 (six) hours as needed for wheezing or shortness of breath.   ALPRAZolam (XANAX) 0.25 MG tablet Take 1 tablet (0.25 mg total) by mouth 2 (two) times daily as needed for anxiety.   Cholecalciferol (VITAMIN D) 50 MCG (2000 UT) tablet  Take 2,000 Units by mouth daily.   pantoprazole (PROTONIX) 40 MG tablet Take 1 tablet (40 mg total) by mouth daily.   phenylephrine (,USE FOR PREPARATION-H,) 0.25 % suppository Place 1 suppository rectally as needed for hemorrhoids.   predniSONE (STERAPRED UNI-PAK 21 TAB) 10 MG (21) TBPK tablet As directed x 6 days   simvastatin (ZOCOR) 40 MG tablet Take 1 tablet (40 mg total) by mouth daily.   No facility-administered encounter medications on file as of 08/30/2022.    Past Surgical History:  Procedure Laterality Date   ABDOMINAL HYSTERECTOMY     partial    COLONOSCOPY     EYE SURGERY     cataracts removed - bilateral     Family History  Problem Relation Age of Onset   Other Mother        age and frailty    Heart disease Father        by-pass   Heart attack Brother        72    Heart disease Brother    Diabetes Daughter    Colon cancer Sister    Cancer Sister        brain tumor - 26   Heart attack Son  at age 28   Esophageal cancer Neg Hx    Rectal cancer Neg Hx    Stomach cancer Neg Hx    Liver cancer Neg Hx       Controlled substance contract: 08/1122- drug screen today   Patient Active Problem List   Diagnosis Date Noted   Age-related osteoporosis without current pathological fracture 01/15/2019   GERD (gastroesophageal reflux disease) 04/10/2014   Hyperlipidemia 06/23/2013   Generalized anxiety disorder 06/23/2013   Family history of colon cancer - sister - late 50's 04/26/2013       Review of Systems  Constitutional:  Negative for diaphoresis.  Eyes:  Negative for pain.  Respiratory:  Negative for shortness of breath.   Cardiovascular:  Negative for chest pain, palpitations and leg swelling.  Gastrointestinal:  Negative for abdominal pain.  Endocrine: Negative for polydipsia.  Skin:  Negative for rash.  Neurological:  Negative for dizziness, weakness and headaches.  Hematological:  Does not bruise/bleed easily.  All other systems reviewed and  are negative.      Objective:   Physical Exam Vitals and nursing note reviewed.  Constitutional:      General: She is not in acute distress.    Appearance: Normal appearance. She is well-developed.  HENT:     Head: Normocephalic.     Right Ear: Tympanic membrane normal.     Left Ear: Tympanic membrane normal.     Nose: Nose normal.     Mouth/Throat:     Mouth: Mucous membranes are moist.  Eyes:     Pupils: Pupils are equal, round, and reactive to light.  Neck:     Vascular: No carotid bruit or JVD.  Cardiovascular:     Rate and Rhythm: Normal rate and regular rhythm.     Heart sounds: Normal heart sounds.  Pulmonary:     Effort: Pulmonary effort is normal. No respiratory distress.     Breath sounds: Normal breath sounds. No wheezing or rales.  Chest:     Chest wall: No tenderness.  Abdominal:     General: Bowel sounds are normal. There is no distension or abdominal bruit.     Palpations: Abdomen is soft. There is no hepatomegaly, splenomegaly, mass or pulsatile mass.     Tenderness: There is no abdominal tenderness.  Musculoskeletal:        General: Normal range of motion.     Cervical back: Normal range of motion and neck supple.  Lymphadenopathy:     Cervical: No cervical adenopathy.  Skin:    General: Skin is warm and dry.  Neurological:     Mental Status: She is alert and oriented to person, place, and time.     Deep Tendon Reflexes: Reflexes are normal and symmetric.  Psychiatric:        Behavior: Behavior normal.        Thought Content: Thought content normal.        Judgment: Judgment normal.    BP 132/88   Pulse 84   Temp (!) 97.5 F (36.4 C) (Temporal)   Resp 20   Ht '5\' 3"'$  (1.6 m)   Wt 127 lb (57.6 kg)   SpO2 100%   BMI 22.50 kg/m   EKG- NSR-Mary-Margaret Hassell Done, FNP  Chest xray- chronic bronchitic changes-Preliminary reading by Ronnald Collum, FNP  [facility]       Assessment & Plan:   [name] comes in today with chief complaint of Annual  Exam   Diagnosis and orders addressed:  1. Annual physical exam - DG Chest 2 View - EKG 12-Lead  2. Mixed hyperlipidemia Low fat diet - DG Chest 2 View - EKG 12-Lead - simvastatin (ZOCOR) 40 MG tablet; Take 1 tablet (40 mg total) by mouth daily.  Dispense: 90 tablet; Refill: 1  3. Gastroesophageal reflux disease, unspecified whether esophagitis present Avoid spicy foods Do not eat 2 hours prior to bedtime  - pantoprazole (PROTONIX) 40 MG tablet; Take 1 tablet (40 mg total) by mouth daily.  Dispense: 90 tablet; Refill: 1  4. Generalized anxiety disorder Stress management - ALPRAZolam (XANAX) 0.25 MG tablet; Take 1 tablet (0.25 mg total) by mouth 2 (two) times daily as needed for anxiety.  Dispense: 60 tablet; Refill: 2 - ToxASSURE Select 13 (MW), Urine  5. Age-related osteoporosis without current pathological fracture Weight bearing exercise   Labs pending Health Maintenance reviewed Diet and exercise encouraged  Follow up plan: 6 months   [place], FNP

## 2022-08-30 NOTE — Addendum Note (Signed)
Addended by: Rolena Infante on: 08/30/2022 11:17 AM   Modules accepted: Orders

## 2022-08-31 ENCOUNTER — Telehealth: Payer: Self-pay | Admitting: *Deleted

## 2022-08-31 LAB — CMP14+EGFR
ALT: 28 IU/L (ref 0–32)
AST: 29 IU/L (ref 0–40)
Albumin/Globulin Ratio: 1.8 (ref 1.2–2.2)
Albumin: 4.2 g/dL (ref 3.8–4.8)
Alkaline Phosphatase: 66 IU/L (ref 44–121)
BUN/Creatinine Ratio: 8 — ABNORMAL LOW (ref 12–28)
BUN: 7 mg/dL — ABNORMAL LOW (ref 8–27)
Bilirubin Total: 0.5 mg/dL (ref 0.0–1.2)
CO2: 24 mmol/L (ref 20–29)
Calcium: 9.5 mg/dL (ref 8.7–10.3)
Chloride: 107 mmol/L — ABNORMAL HIGH (ref 96–106)
Creatinine, Ser: 0.83 mg/dL (ref 0.57–1.00)
Globulin, Total: 2.4 g/dL (ref 1.5–4.5)
Glucose: 92 mg/dL (ref 70–99)
Potassium: 3.9 mmol/L (ref 3.5–5.2)
Sodium: 145 mmol/L — ABNORMAL HIGH (ref 134–144)
Total Protein: 6.6 g/dL (ref 6.0–8.5)
eGFR: 73 mL/min/{1.73_m2} (ref >59)

## 2022-08-31 LAB — CBC WITH DIFFERENTIAL/PLATELET
Basophils Absolute: 0 10*3/uL (ref 0.0–0.2)
Basos: 1 %
EOS (ABSOLUTE): 0.1 10*3/uL (ref 0.0–0.4)
Eos: 1 %
Hematocrit: 40.6 % (ref 34.0–46.6)
Hemoglobin: 13.6 g/dL (ref 11.1–15.9)
Immature Grans (Abs): 0 10*3/uL (ref 0.0–0.1)
Immature Granulocytes: 0 %
Lymphocytes Absolute: 1.6 10*3/uL (ref 0.7–3.1)
Lymphs: 36 %
MCH: 30.5 pg (ref 26.6–33.0)
MCHC: 33.5 g/dL (ref 31.5–35.7)
MCV: 91 fL (ref 79–97)
Monocytes Absolute: 0.3 10*3/uL (ref 0.1–0.9)
Monocytes: 6 %
Neutrophils Absolute: 2.5 10*3/uL (ref 1.4–7.0)
Neutrophils: 56 %
Platelets: 264 10*3/uL (ref 150–450)
RBC: 4.46 x10E6/uL (ref 3.77–5.28)
RDW: 12.9 % (ref 11.7–15.4)
WBC: 4.4 10*3/uL (ref 3.4–10.8)

## 2022-08-31 LAB — LIPID PANEL
Chol/HDL Ratio: 2.8 ratio (ref 0.0–4.4)
Cholesterol, Total: 178 mg/dL (ref 100–199)
HDL: 64 mg/dL (ref >39)
LDL Chol Calc (NIH): 87 mg/dL (ref 0–99)
Triglycerides: 159 mg/dL — ABNORMAL HIGH (ref 0–149)
VLDL Cholesterol Cal: 27 mg/dL (ref 5–40)

## 2022-08-31 NOTE — Telephone Encounter (Signed)
CALL REPORT  IMPRESSION: 1. Minimal linear atelectasis and or scarring right lung base. 2. Questionable nodular density noted over the left lung base, possibly a nipple shadow. Repeat PA and lateral chest x-ray with nipple markers suggested.

## 2022-09-02 ENCOUNTER — Ambulatory Visit (INDEPENDENT_AMBULATORY_CARE_PROVIDER_SITE_OTHER): Payer: Medicare HMO

## 2022-09-02 ENCOUNTER — Other Ambulatory Visit: Payer: Self-pay | Admitting: Nurse Practitioner

## 2022-09-02 DIAGNOSIS — Z0389 Encounter for observation for other suspected diseases and conditions ruled out: Secondary | ICD-10-CM | POA: Diagnosis not present

## 2022-09-02 DIAGNOSIS — R9389 Abnormal findings on diagnostic imaging of other specified body structures: Secondary | ICD-10-CM | POA: Diagnosis not present

## 2022-09-02 LAB — TOXASSURE SELECT 13 (MW), URINE

## 2022-11-13 ENCOUNTER — Other Ambulatory Visit: Payer: Self-pay | Admitting: Nurse Practitioner

## 2022-11-18 ENCOUNTER — Encounter: Payer: Self-pay | Admitting: Family Medicine

## 2022-11-18 ENCOUNTER — Ambulatory Visit (INDEPENDENT_AMBULATORY_CARE_PROVIDER_SITE_OTHER): Payer: Medicare HMO | Admitting: Family Medicine

## 2022-11-18 VITALS — BP 143/76 | HR 83 | Temp 98.4°F | Ht 63.0 in | Wt 126.1 lb

## 2022-11-18 DIAGNOSIS — J0140 Acute pansinusitis, unspecified: Secondary | ICD-10-CM

## 2022-11-18 MED ORDER — PREDNISONE 10 MG (21) PO TBPK: ORAL_TABLET | ORAL | 0 refills | Status: DC

## 2022-11-18 MED ORDER — AMOXICILLIN-POT CLAVULANATE 875-125 MG PO TABS: 1.0000 | ORAL_TABLET | Freq: Two times a day (BID) | ORAL | 0 refills | Status: AC

## 2022-11-18 NOTE — Progress Notes (Signed)
Acute Office Visit  Subjective:     Patient ID: Courtney Johnson, female    DOB: Apr 26, 1946, 77 y.o.   MRN: [id number]  Chief Complaint  Patient presents with   Sinusitis    Sinusitis This is a new problem. Episode onset: 10 days. The problem is unchanged. There has been no fever. Associated symptoms include congestion, coughing, ear pain (right ear), headaches, sinus pressure, sneezing and a sore throat. Pertinent negatives include no chills, diaphoresis, hoarse voice, neck pain, shortness of breath or swollen glands. Past treatments include oral decongestants. The treatment provided mild relief.     Review of Systems  Constitutional:  Negative for chills and diaphoresis.  HENT:  Positive for congestion, ear pain (right ear), sinus pressure, sneezing and sore throat. Negative for hoarse voice.   Respiratory:  Positive for cough. Negative for shortness of breath.   Musculoskeletal:  Negative for neck pain.  Neurological:  Positive for headaches.        Objective:    BP (!) 143/76   Pulse 83   Temp 98.4 F (36.9 C) (Temporal)   Ht 5\' 3"  (1.6 m)   Wt 126 lb 2 oz (57.2 kg)   SpO2 98%   BMI 22.34 kg/m    Physical Exam Vitals and nursing note reviewed.  Constitutional:      General: She is not in acute distress.    Appearance: Normal appearance. She is not ill-appearing, toxic-appearing or diaphoretic.  HENT:     Right Ear: Ear canal and external ear normal. A middle ear effusion is present. Tympanic membrane is bulging. Tympanic membrane is not scarred, perforated, erythematous or retracted.     Left Ear: Ear canal and external ear normal. A middle ear effusion is present. Tympanic membrane is not scarred, perforated, erythematous, retracted or bulging.     Nose: Congestion present.     Right Sinus: Maxillary sinus tenderness and frontal sinus tenderness present.     Left Sinus: Maxillary sinus tenderness and frontal sinus tenderness present.     Mouth/Throat:      Mouth: Mucous membranes are moist.     Pharynx: Oropharynx is clear.  Eyes:     General: No scleral icterus.       Right eye: No discharge.        Left eye: No discharge.     Extraocular Movements: Extraocular movements intact.     Conjunctiva/sclera: Conjunctivae normal.  Cardiovascular:     Rate and Rhythm: Normal rate and regular rhythm.     Heart sounds: Normal heart sounds. No murmur heard. Pulmonary:     Effort: Pulmonary effort is normal. No respiratory distress.     Breath sounds: Normal breath sounds. No wheezing or rhonchi.  Musculoskeletal:     Cervical back: Neck supple. No rigidity.     Right lower leg: No edema.     Left lower leg: No edema.  Lymphadenopathy:     Cervical: No cervical adenopathy.  Skin:    General: Skin is warm and dry.  Neurological:     General: No focal deficit present.     Mental Status: She is alert and oriented to person, place, and time.  Psychiatric:        Mood and Affect: Mood normal.        Behavior: Behavior normal.     No results found for any visits on 11/18/22.      Assessment & Plan:   Courtney Johnson was seen today for sinusitis.  Diagnoses and all orders for this visit:  Acute non-recurrent pansinusitis Augmentin and prednisone taper pack as below. Discussed symptomatic care and return precautions.  -     amoxicillin-clavulanate (AUGMENTIN) 875-125 MG tablet; Take 1 tablet by mouth 2 (two) times daily for 7 days. -     predniSONE (STERAPRED UNI-PAK 21 TAB) 10 MG (21) TBPK tablet; Use as directed on back of pill pack  The patient indicates understanding of these issues and agrees with the plan.  Gabriel Earing, FNP

## 2023-01-03 DIAGNOSIS — H524 Presbyopia: Secondary | ICD-10-CM | POA: Diagnosis not present

## 2023-01-03 DIAGNOSIS — H353131 Nonexudative age-related macular degeneration, bilateral, early dry stage: Secondary | ICD-10-CM | POA: Diagnosis not present

## 2023-01-04 DIAGNOSIS — H524 Presbyopia: Secondary | ICD-10-CM | POA: Diagnosis not present

## 2023-01-07 ENCOUNTER — Other Ambulatory Visit: Payer: Self-pay | Admitting: Nurse Practitioner

## 2023-01-07 DIAGNOSIS — E782 Mixed hyperlipidemia: Secondary | ICD-10-CM

## 2023-03-01 ENCOUNTER — Encounter: Payer: Self-pay | Admitting: Nurse Practitioner

## 2023-03-01 ENCOUNTER — Ambulatory Visit (INDEPENDENT_AMBULATORY_CARE_PROVIDER_SITE_OTHER): Payer: Medicare HMO | Admitting: Nurse Practitioner

## 2023-03-01 ENCOUNTER — Ambulatory Visit (INDEPENDENT_AMBULATORY_CARE_PROVIDER_SITE_OTHER): Payer: Medicare HMO

## 2023-03-01 VITALS — BP 136/78 | HR 75 | Temp 96.3°F | Resp 20 | Ht 63.0 in | Wt 126.0 lb

## 2023-03-01 DIAGNOSIS — F411 Generalized anxiety disorder: Secondary | ICD-10-CM | POA: Diagnosis not present

## 2023-03-01 DIAGNOSIS — E782 Mixed hyperlipidemia: Secondary | ICD-10-CM

## 2023-03-01 DIAGNOSIS — Z780 Asymptomatic menopausal state: Secondary | ICD-10-CM | POA: Diagnosis not present

## 2023-03-01 DIAGNOSIS — M810 Age-related osteoporosis without current pathological fracture: Secondary | ICD-10-CM | POA: Diagnosis not present

## 2023-03-01 DIAGNOSIS — K219 Gastro-esophageal reflux disease without esophagitis: Secondary | ICD-10-CM

## 2023-03-01 MED ORDER — PANTOPRAZOLE SODIUM 40 MG PO TBEC: 40.0000 mg | DELAYED_RELEASE_TABLET | Freq: Every day | ORAL | 1 refills | Status: DC

## 2023-03-01 MED ORDER — ALPRAZOLAM 0.25 MG PO TABS
0.2500 mg | ORAL_TABLET | Freq: Two times a day (BID) | ORAL | 2 refills | Status: DC | PRN
Start: 2023-03-01 — End: 2023-08-23

## 2023-03-01 MED ORDER — SIMVASTATIN 40 MG PO TABS: 40.0000 mg | ORAL_TABLET | Freq: Every day | ORAL | 1 refills | Status: DC

## 2023-03-01 NOTE — Patient Instructions (Signed)
Fall Prevention in the Home, Adult Falls can cause injuries and can happen to people of all ages. There are many things you can do to make your home safer and to help prevent falls. What actions can I take to prevent falls? General information Use good lighting in all rooms. Make sure to: Replace any light bulbs that burn out. Turn on the lights in dark areas and use night-lights. Keep items that you use often in easy-to-reach places. Lower the shelves around your home if needed. Move furniture so that there are clear paths around it. Do not use throw rugs or other things on the floor that can make you trip. If any of your floors are uneven, fix them. Add color or contrast paint or tape to clearly mark and help you see: Grab bars or handrails. First and last steps of staircases. Where the edge of each step is. If you use a ladder or stepladder: Make sure that it is fully opened. Do not climb a closed ladder. Make sure the sides of the ladder are locked in place. Have someone hold the ladder while you use it. Know where your pets are as you move through your home. What can I do in the bathroom?     Keep the floor dry. Clean up any water on the floor right away. Remove soap buildup in the bathtub or shower. Buildup makes bathtubs and showers slippery. Use non-skid mats or decals on the floor of the bathtub or shower. Attach bath mats securely with double-sided, non-slip rug tape. If you need to sit down in the shower, use a non-slip stool. Install grab bars by the toilet and in the bathtub and shower. Do not use towel bars as grab bars. What can I do in the bedroom? Make sure that you have a light by your bed that is easy to reach. Do not use any sheets or blankets on your bed that hang to the floor. Have a firm chair or bench with side arms that you can use for support when you get dressed. What can I do in the kitchen? Clean up any spills right away. If you need to reach something  above you, use a step stool with a grab bar. Keep electrical cords out of the way. Do not use floor polish or wax that makes floors slippery. What can I do with my stairs? Do not leave anything on the stairs. Make sure that you have a light switch at the top and the bottom of the stairs. Make sure that there are handrails on both sides of the stairs. Fix handrails that are broken or loose. Install non-slip stair treads on all your stairs if they do not have carpet. Avoid having throw rugs at the top or bottom of the stairs. Choose a carpet that does not hide the edge of the steps on the stairs. Make sure that the carpet is firmly attached to the stairs. Fix carpet that is loose or worn. What can I do on the outside of my home? Use bright outdoor lighting. Fix the edges of walkways and driveways and fix any cracks. Clear paths of anything that can make you trip, such as tools or rocks. Add color or contrast paint or tape to clearly mark and help you see anything that might make you trip as you walk through a door, such as a raised step or threshold. Trim any bushes or trees on paths to your home. Check to see if handrails are loose   or broken and that both sides of all steps have handrails. Install guardrails along the edges of any raised decks and porches. Have leaves, snow, or ice cleared regularly. Use sand, salt, or ice melter on paths if you live where there is ice and snow during the winter. Clean up any spills in your garage right away. This includes grease or oil spills. What other actions can I take? Review your medicines with your doctor. Some medicines can cause dizziness or changes in blood pressure, which increase your risk of falling. Wear shoes that: Have a low heel. Do not wear high heels. Have rubber bottoms and are closed at the toe. Feel good on your feet and fit well. Use tools that help you move around if needed. These include: Canes. Walkers. Scooters. Crutches. Ask  your doctor what else you can do to help prevent falls. This may include seeing a physical therapist to learn to do exercises to move better and get stronger. Where to find more information Centers for Disease Control and Prevention, [employer]: cdc.gov National Institute on Aging: nia.nih.gov National Institute on Aging: nia.nih.gov Contact a doctor if: You are afraid of falling at home. You feel weak, drowsy, or dizzy at home. You fall at home. Get help right away if you: Lose consciousness or have trouble moving after a fall. Have a fall that causes a head injury. These symptoms may be an emergency. Get help right away. Call 911. Do not wait to see if the symptoms will go away. Do not drive yourself to the hospital. This information is not intended to replace advice given to you by your health care provider. Make sure you discuss any questions you have with your health care provider. Document Revised: 02/08/2022 Document Reviewed: 02/08/2022 Elsevier Patient Education  2024 Elsevier Inc.  

## 2023-03-01 NOTE — Progress Notes (Signed)
Subjective:    Patient ID: Courtney Johnson, female    DOB: 1946-05-31, 77 y.o.   MRN: [id number]   Chief Complaint: medical management of chronic issues     HPI:  Courtney Johnson is a 77 y.o. who identifies as a female who was assigned female at birth.   Social history: Lives with: husband Work history: retired   Water engineer in today for follow up of the following chronic medical issues:  1. Mixed hyperlipidemia Does watch diet but does no dedicated exercise. Lab Results  Component Value Date   CHOL 178 08/30/2022   HDL 64 08/30/2022   LDLCALC 87 08/30/2022   TRIG 159 (H) 08/30/2022   CHOLHDL 2.8 08/30/2022     2. Gastroesophageal reflux disease, unspecified whether esophagitis present Is on protonix daily and is doing well  3. Generalized anxiety disorder Is on xanax daily and is doing well.    03/01/2023    9:41 AM 08/30/2022   10:42 AM 07/02/2022    9:18 AM 04/29/2022   11:30 AM  GAD 7 : Generalized Anxiety Score  Nervous, Anxious, on Edge 0 1 0 0  Control/stop worrying 0 0 0 0  Worry too much - different things 1 1 0 0  Trouble relaxing 0 0 0 0  Restless 0 0 0 0  Easily annoyed or irritable 0 0 0 0  Afraid - awful might happen 0 0 0 0  Total GAD 7 Score 1 2 0 0  Anxiety Difficulty Not difficult at all Not difficult at all Not difficult at all Not difficult at all      4. Age-related osteoporosis without current pathological fracture Last dexascan was greater than 2 years ago. Will repeat today. Doe snot weiht bearing exercises/   New complaints: Family history of heart disease and she is concerned even though she has had no symptoms  No Known Allergies Outpatient Encounter Medications as of 03/01/2023  Medication Sig   albuterol (VENTOLIN HFA) 108 (90 Base) MCG/ACT inhaler Inhale 2 puffs into the lungs every 6 (six) hours as needed for wheezing or shortness of breath.   ALPRAZolam (XANAX) 0.25 MG tablet Take 1 tablet (0.25 mg total) by mouth 2 (two)  times daily as needed for anxiety.   Ascorbic Acid (VITAMIN C) 500 MG CAPS Take by mouth.   Cholecalciferol (VITAMIN D) 50 MCG (2000 UT) tablet Take 2,000 Units by mouth daily.   pantoprazole (PROTONIX) 40 MG tablet Take 1 tablet (40 mg total) by mouth daily.   phenylephrine (,USE FOR PREPARATION-H,) 0.25 % suppository Place 1 suppository rectally as needed for hemorrhoids.   predniSONE (STERAPRED UNI-PAK 21 TAB) 10 MG (21) TBPK tablet Use as directed on back of pill pack   simvastatin (ZOCOR) 40 MG tablet Take 1 tablet by mouth once daily   No facility-administered encounter medications on file as of 03/01/2023.    Past Surgical History:  Procedure Laterality Date   ABDOMINAL HYSTERECTOMY     partial    COLONOSCOPY     EYE SURGERY     cataracts removed - bilateral     Family History  Problem Relation Age of Onset   Other Mother        age and frailty    Heart disease Father        by-pass   Heart attack Brother        34    Heart disease Brother    Diabetes Daughter    Colon cancer  Sister    Cancer Sister        brain tumor - 56   Heart attack Son        at age 69   Esophageal cancer Neg Hx    Rectal cancer Neg Hx    Stomach cancer Neg Hx    Liver cancer Neg Hx       Controlled substance contract: n/a     Review of Systems  Constitutional:  Negative for diaphoresis.  Eyes:  Negative for pain.  Respiratory:  Negative for shortness of breath.   Cardiovascular:  Negative for chest pain, palpitations and leg swelling.  Gastrointestinal:  Negative for abdominal pain.  Endocrine: Negative for polydipsia.  Skin:  Negative for rash.  Neurological:  Negative for dizziness, weakness and headaches.  Hematological:  Does not bruise/bleed easily.  All other systems reviewed and are negative.      Objective:   Physical Exam Vitals and nursing note reviewed.  Constitutional:      General: She is not in acute distress.    Appearance: Normal appearance. She is  well-developed.  HENT:     Head: Normocephalic.     Right Ear: Tympanic membrane normal.     Left Ear: Tympanic membrane normal.     Nose: Nose normal.     Mouth/Throat:     Mouth: Mucous membranes are moist.  Eyes:     Pupils: Pupils are equal, round, and reactive to light.  Neck:     Vascular: No carotid bruit or JVD.  Cardiovascular:     Rate and Rhythm: Normal rate and regular rhythm.     Heart sounds: Normal heart sounds.  Pulmonary:     Effort: Pulmonary effort is normal. No respiratory distress.     Breath sounds: Normal breath sounds. No wheezing or rales.  Chest:     Chest wall: No tenderness.  Abdominal:     General: Bowel sounds are normal. There is no distension or abdominal bruit.     Palpations: Abdomen is soft. There is no hepatomegaly, splenomegaly, mass or pulsatile mass.     Tenderness: There is no abdominal tenderness.  Musculoskeletal:        General: Normal range of motion.     Cervical back: Normal range of motion and neck supple.  Lymphadenopathy:     Cervical: No cervical adenopathy.  Skin:    General: Skin is warm and dry.  Neurological:     Mental Status: She is alert and oriented to person, place, and time.     Deep Tendon Reflexes: Reflexes are normal and symmetric.  Psychiatric:        Behavior: Behavior normal.        Thought Content: Thought content normal.        Judgment: Judgment normal.    BP 136/78   Pulse 75   Temp (!) 96.3 F (35.7 C) (Temporal)   Resp 20   Ht 5\' 3"  (1.6 m)   Wt 126 lb (57.2 kg)   SpO2 100%   BMI 22.32 kg/m         Assessment & Plan:   Courtney Johnson comes in today with chief complaint of Medical Management of Chronic Issues   Diagnosis and orders addressed:  1. Mixed hyperlipidemia Low fat diet - simvastatin (ZOCOR) 40 MG tablet; Take 1 tablet (40 mg total) by mouth daily.  Dispense: 90 tablet; Refill: 1 - CBC with Differential/Platelet - CMP14+EGFR - Lipid panel  2. Gastroesophageal reflux  disease, unspecified whether esophagitis present Avoid spicy foods Do not eat 2 hours prior to bedtime  - pantoprazole (PROTONIX) 40 MG tablet; Take 1 tablet (40 mg total) by mouth daily.  Dispense: 90 tablet; Refill: 1  3. Generalized anxiety disorder Stress management - ALPRAZolam (XANAX) 0.25 MG tablet; Take 1 tablet (0.25 mg total) by mouth 2 (two) times daily as needed for anxiety.  Dispense: 60 tablet; Refill: 2  4. Age-related osteoporosis without current pathological fracture Weight bearing exercise - DG [facility] DEXA   Labs pending Health Maintenance reviewed Diet and exercise encouraged  Follow up plan: 6 months   [name], FNP

## 2023-03-02 LAB — CBC WITH DIFFERENTIAL/PLATELET
Basophils Absolute: 0 10*3/uL (ref 0.0–0.2)
Basos: 1 %
EOS (ABSOLUTE): 0.1 10*3/uL (ref 0.0–0.4)
Eos: 1 %
Hematocrit: 41.4 % (ref 34.0–46.6)
Hemoglobin: 13.8 g/dL (ref 11.1–15.9)
Immature Grans (Abs): 0 10*3/uL (ref 0.0–0.1)
Immature Granulocytes: 0 %
Lymphocytes Absolute: 1.5 10*3/uL (ref 0.7–3.1)
Lymphs: 37 %
MCH: 31.4 pg (ref 26.6–33.0)
MCHC: 33.3 g/dL (ref 31.5–35.7)
MCV: 94 fL (ref 79–97)
Monocytes Absolute: 0.3 10*3/uL (ref 0.1–0.9)
Monocytes: 7 %
Neutrophils Absolute: 2.1 10*3/uL (ref 1.4–7.0)
Neutrophils: 54 %
Platelets: 264 10*3/uL (ref 150–450)
RBC: 4.4 x10E6/uL (ref 3.77–5.28)
RDW: 12.5 % (ref 11.7–15.4)
WBC: 4 10*3/uL (ref 3.4–10.8)

## 2023-03-02 LAB — CMP14+EGFR
ALT: 26 IU/L (ref 0–32)
AST: 26 IU/L (ref 0–40)
Albumin: 4.4 g/dL (ref 3.8–4.8)
Alkaline Phosphatase: 69 IU/L (ref 44–121)
BUN/Creatinine Ratio: 12 (ref 12–28)
BUN: 10 mg/dL (ref 8–27)
Bilirubin Total: 0.4 mg/dL (ref 0.0–1.2)
CO2: 23 mmol/L (ref 20–29)
Calcium: 9.4 mg/dL (ref 8.7–10.3)
Chloride: 106 mmol/L (ref 96–106)
Creatinine, Ser: 0.83 mg/dL (ref 0.57–1.00)
Globulin, Total: 2.4 g/dL (ref 1.5–4.5)
Glucose: 94 mg/dL (ref 70–99)
Potassium: 4.1 mmol/L (ref 3.5–5.2)
Sodium: 143 mmol/L (ref 134–144)
Total Protein: 6.8 g/dL (ref 6.0–8.5)
eGFR: 73 mL/min/{1.73_m2} (ref >59)

## 2023-03-02 LAB — LIPID PANEL
Chol/HDL Ratio: 3 ratio (ref 0.0–4.4)
Cholesterol, Total: 200 mg/dL — ABNORMAL HIGH (ref 100–199)
HDL: 66 mg/dL (ref >39)
LDL Chol Calc (NIH): 117 mg/dL — ABNORMAL HIGH (ref 0–99)
Triglycerides: 97 mg/dL (ref 0–149)
VLDL Cholesterol Cal: 17 mg/dL (ref 5–40)

## 2023-03-04 ENCOUNTER — Other Ambulatory Visit: Payer: Self-pay

## 2023-03-04 DIAGNOSIS — M810 Age-related osteoporosis without current pathological fracture: Secondary | ICD-10-CM

## 2023-03-07 ENCOUNTER — Telehealth: Payer: Self-pay

## 2023-03-07 NOTE — Progress Notes (Signed)
Care Guide Note  03/07/2023 Name: Courtney Johnson MRN: [id number] DOB: 07/23/1945  Referred by: Bennie Pierini, FNP Reason for referral : Care Coordination (Outreach to schedule with Pharm d )   Courtney Johnson is a 77 y.o. year old female who is a primary care patient of Janeal, Poyer, FNP. Courtney Johnson was referred to the pharmacist for assistance related to  osteoperosis .    An unsuccessful telephone outreach was attempted today to contact the patient who was referred to the pharmacy team for assistance with medication management. Additional attempts will be made to contact the patient.   Penne Lash, RMA Care Guide [facility]  [place], [place] [address] Direct Dial: [phone] [email or web address]

## 2023-03-09 NOTE — Progress Notes (Signed)
Care Guide Note  03/09/2023 Name: ZANYIAH CRUS MRN: [id number] DOB: Mar 09, 1946  Referred by: Bennie Pierini, FNP Reason for referral : Care Coordination (Outreach to schedule with Pharm d )   TIFFAY KEARNES is a 77 y.o. year old female who is a primary care patient of Zarriyah, Ding, FNP. SHELTON FURNISS was referred to the pharmacist for assistance related to  osteoperosis .    A second unsuccessful telephone outreach was attempted today to contact the patient who was referred to the pharmacy team for assistance with medication assistance. Additional attempts will be made to contact the patient.  Penne Lash, RMA Care Guide [facility]  [place], [place] [address] Direct Dial: [phone] [email or web address]

## 2023-03-11 NOTE — Progress Notes (Signed)
Care Guide Note  03/11/2023 Name: KEBRA HALKER MRN: [id number] DOB: June 09, 1946  Referred by: Bennie Pierini, FNP Reason for referral : Care Coordination (Outreach to schedule with Pharm d )   ARAOLUWA NINH is a 77 y.o. year old female who is a primary care patient of [place], [place], FNP. LAVINIA KASAL was referred to the pharmacist for assistance related to  osteoperosis .    Successful contact was made with the patient to discuss pharmacy services. Patient declines engagement at this time. Contact information was provided to the patient should they wish to reach out for assistance at a later time.  Penne Lash, RMA Care Guide [facility]  [place], [place] [address] Direct Dial: [phone] [email or web address]

## 2023-04-29 ENCOUNTER — Encounter: Payer: Self-pay | Admitting: Nurse Practitioner

## 2023-04-29 ENCOUNTER — Ambulatory Visit (INDEPENDENT_AMBULATORY_CARE_PROVIDER_SITE_OTHER): Payer: Medicare HMO | Admitting: Nurse Practitioner

## 2023-04-29 VITALS — BP 151/88 | HR 83 | Temp 97.1°F | Resp 20 | Ht 63.0 in | Wt 125.0 lb

## 2023-04-29 DIAGNOSIS — R1013 Epigastric pain: Secondary | ICD-10-CM | POA: Diagnosis not present

## 2023-04-29 DIAGNOSIS — R0782 Intercostal pain: Secondary | ICD-10-CM

## 2023-04-29 DIAGNOSIS — K8021 Calculus of gallbladder without cholecystitis with obstruction: Secondary | ICD-10-CM | POA: Diagnosis not present

## 2023-04-29 NOTE — Patient Instructions (Signed)
Cholelithiasis  Cholelithiasis happens when gallstones form in the gallbladder. The gallbladder stores bile. Bile is a fluid that helps digest fats. Bile can harden and form into gallstones. If they cause a blockage, they can cause pain (gallbladder attack). What are the causes? This condition may be caused by: Too much bilirubin in the bile. This happens if you have sickle cell anemia. Too much of a fat-like substance (cholesterol) in your bile. Not enough bile salts in your bile. These salts help the body absorb and digest fats. The gallbladder not emptying fully or often enough. This is common in pregnant women. What increases the risk? The following factors may make you more likely to develop this condition: Being older than age 3. Eating a lot of fried foods, fat, and refined carbs (refined carbohydrates). Being female. Being pregnant many times. Using medicines with female hormones in them for a long time. Losing weight fast. Having gallstones in your family. Having health problems, such as diabetes, obesity, Crohn's disease, or liver disease. What are the signs or symptoms? Often, there may be gallstones but no symptoms. These gallstones are called silent gallstones. If a gallstone causes a blockage, you may get sudden pain. The pain: Can be in the upper right part of your belly (abdomen). Normally comes at night or after you eat. Can last an hour or more. Can spread to your right shoulder, back, or chest. Can feel like discomfort, burning, or fullness in the upper part of your belly (indigestion). If the blockage lasts more than a few hours, you can get an infection or swelling. You may: Vomit or feel like you may vomit (nauseous). Feel bloated. Have belly pain for 5 hours or more. Feel tender in your belly, often in the upper right part and under your ribs. Have a fever or chills. Have skin or the white parts of your eyes turn yellow (jaundice). Have dark pee (urine) or  pale poop (stool). How is this treated? Treatment for this condition depends on how bad you feel. If you have symptoms, you may need: Home care, if symptoms are not very bad. Do not eat for 12-24 hours. Drink only water and clear liquids. After 1 or 2 days, start to eat simple or clear foods. Try broth and crackers. You may need medicines for pain or stomach upset or both. If you have an infection, you will need antibiotics. A hospital stay, if you have very bad pain or a very bad infection. Surgery to remove your gallbladder. You may need this if: Gallstones keep coming back. You have very bad symptoms. Medicines to break up gallstones. Medicines may be used for 6-12 months. A procedure to find and take out gallstones or to break up gallstones. Follow these instructions at home: Medicines Take over-the-counter and prescription medicines only as told by your doctor. If you were prescribed antibiotics, take them as told by your doctor. Do not stop taking them even if you start to feel better. Ask your doctor if you should avoid driving or using machines while you are taking your medicine. Eating and drinking Drink enough fluid to keep your pee pale yellow. Drink water or clear fluids. This is important when you have pain. Eat healthy foods. Choose: Fewer fatty foods, such as fried foods. Fewer refined carbs. Avoid breads and grains that are highly processed, such as white bread and white rice. Choose whole grains, such as whole-wheat bread and brown rice. More fiber. Almonds, fresh fruit, and beans are healthy sources. General  instructions Keep a healthy weight. Keep all follow-up visits. You may need to see a specialist or a Careers adviser. Where to find more information [employer] of Diabetes and Digestive and Kidney Diseases: [email or web address] Contact a doctor if: You have sudden pain in the upper right part of your belly. Pain might spread to your right shoulder, back, or chest. Your  pain lasts more than 2 hours. You have been diagnosed with gallstones that have no symptoms and you get: Belly pain. Discomfort, burning, or fullness in the upper part of your abdomen. You keep feeling like you may vomit. You have dark pee or pale poop. Get help right away if: You have pain in your abdomen, that: Lasts more than 5 hours. Keeps getting worse. You have a fever or chills. You can't stop vomiting. Your skin or the white parts of your eyes turn yellow. This information is not intended to replace advice given to you by your health care provider. Make sure you discuss any questions you have with your health care provider. Document Revised: 03/22/2022 Document Reviewed: 03/22/2022 Elsevier Patient Education  2024 [employer].

## 2023-04-29 NOTE — Progress Notes (Signed)
   Subjective:    Patient ID: Courtney Johnson, female    DOB: 1945-07-26, 77 y.o.   MRN: [id number]   Chief Complaint: Abdominal Pain   Abdominal Pain Pertinent negatives include no headaches.    Patient was at home Tuesday night and she did not feel well. She called 911. They came and checked her out and they could not find anything wrong. She says she is having upper abdominal pain with indigestion. She is on protonix daily and take gasx as needed. She had a CT scan in December of 2023 and showed gall stones. Pain is worse after eating. Patient Active Problem List   Diagnosis Date Noted   Age-related osteoporosis without current pathological fracture 01/15/2019   GERD (gastroesophageal reflux disease) 04/10/2014   Hyperlipidemia 06/23/2013   Generalized anxiety disorder 06/23/2013   Family history of colon cancer - sister - late 50's 04/26/2013       Review of Systems  Constitutional:  Negative for diaphoresis.  Eyes:  Negative for pain.  Respiratory:  Negative for shortness of breath.   Cardiovascular:  Negative for chest pain, palpitations and leg swelling.  Gastrointestinal:  Positive for abdominal pain.  Endocrine: Negative for polydipsia.  Skin:  Negative for rash.  Neurological:  Negative for dizziness, weakness and headaches.  Hematological:  Does not bruise/bleed easily.  All other systems reviewed and are negative.      Objective:   Physical Exam Constitutional:      Appearance: She is well-developed.  Cardiovascular:     Rate and Rhythm: Normal rate and regular rhythm.     Heart sounds: Normal heart sounds.  Pulmonary:     Effort: Pulmonary effort is normal.     Breath sounds: Normal breath sounds.  Abdominal:     Palpations: Abdomen is soft.  Neurological:     General: No focal deficit present.     Mental Status: She is alert and oriented to person, place, and time.     Cranial Nerves: No cranial nerve deficit.     Sensory: No sensory deficit.      BP (!) 151/88   Pulse 83   Temp (!) 97.1 F (36.2 C) (Temporal)   Resp 20   Ht 5\' 3"  (1.6 m)   Wt 125 lb (56.7 kg)   SpO2 100%   BMI 22.14 kg/m   EKG- Brendolyn Patty, FNP      Assessment & Plan:   [name] in today with chief complaint of Abdominal Pain   1. Intercostal pain - EKG 12-Lead  2. Epigastric pain Avoid spicy foods Do not eat 2 hours prior to bedtime  - US Abdomen Limited RUQ (LIVER/GB); Future    The above assessment and management plan was discussed with the patient. The patient verbalized understanding of and has agreed to the management plan. Patient is aware to call the clinic if symptoms persist or worsen. Patient is aware when to return to the clinic for a follow-up visit. Patient educated on when it is appropriate to go to the emergency department.   [name], FNP '

## 2023-05-03 NOTE — Progress Notes (Unsigned)
   Acute Office Visit  Subjective:     Patient ID: Courtney Johnson, female    DOB: 1945-09-11, 77 y.o.   MRN: [id number]  No chief complaint on file.   HPI  ROS Negative unless indicated in HPI    Objective:    There were no vitals taken for this visit. BP Readings from Last 3 Encounters:  04/29/23 (!) 151/88  03/01/23 136/78  11/18/22 (!) 143/76   Wt Readings from Last 3 Encounters:  04/29/23 125 lb (56.7 kg)  03/01/23 126 lb (57.2 kg)  11/18/22 126 lb 2 oz (57.2 kg)      Physical Exam  No results found for any visits on 05/04/23.      Assessment & Plan:  There are no diagnoses linked to this encounter.  No follow-ups on file.  Arrie Aran Santa Lighter, DNP [facility] [address] [place], [place] [address] [phone]

## 2023-05-04 ENCOUNTER — Ambulatory Visit (INDEPENDENT_AMBULATORY_CARE_PROVIDER_SITE_OTHER): Payer: Medicare HMO | Admitting: Nurse Practitioner

## 2023-05-04 ENCOUNTER — Encounter: Payer: Self-pay | Admitting: Nurse Practitioner

## 2023-05-04 VITALS — BP 125/81 | HR 84 | Temp 98.0°F | Ht 63.0 in | Wt 124.4 lb

## 2023-05-04 DIAGNOSIS — N3000 Acute cystitis without hematuria: Secondary | ICD-10-CM

## 2023-05-04 DIAGNOSIS — R109 Unspecified abdominal pain: Secondary | ICD-10-CM | POA: Diagnosis not present

## 2023-05-04 LAB — URINALYSIS, ROUTINE W REFLEX MICROSCOPIC
Bilirubin, UA: NEGATIVE
Glucose, UA: NEGATIVE
Ketones, UA: NEGATIVE
Nitrite, UA: NEGATIVE
Protein,UA: NEGATIVE
RBC, UA: NEGATIVE
Specific Gravity, UA: <1.005 — ABNORMAL LOW (ref 1.005–1.030)
Urobilinogen, Ur: 0.2 mg/dL (ref 0.2–1.0)
pH, UA: 6.5 (ref 5.0–7.5)

## 2023-05-04 LAB — MICROSCOPIC EXAMINATION
RBC, Urine: NONE SEEN /[HPF] (ref 0–2)
Renal Epithel, UA: NONE SEEN /[HPF]

## 2023-05-04 MED ORDER — DOXYCYCLINE HYCLATE 100 MG PO CAPS: 100.0000 mg | ORAL_CAPSULE | Freq: Two times a day (BID) | ORAL | 0 refills | Status: DC

## 2023-05-06 ENCOUNTER — Ambulatory Visit: Payer: Medicare HMO | Admitting: Nurse Practitioner

## 2023-05-06 LAB — URINE CULTURE

## 2023-05-10 ENCOUNTER — Ambulatory Visit (HOSPITAL_COMMUNITY)
Admission: RE | Admit: 2023-05-10 | Discharge: 2023-05-10 | Disposition: A | Discharge status: Home / Self Care | Payer: Medicare HMO | Source: Ambulatory Visit | Attending: Nurse Practitioner | Admitting: Nurse Practitioner

## 2023-05-10 DIAGNOSIS — K8020 Calculus of gallbladder without cholecystitis without obstruction: Secondary | ICD-10-CM | POA: Diagnosis not present

## 2023-05-10 DIAGNOSIS — R1013 Epigastric pain: Secondary | ICD-10-CM | POA: Diagnosis not present

## 2023-05-10 DIAGNOSIS — K838 Other specified diseases of biliary tract: Secondary | ICD-10-CM | POA: Diagnosis not present

## 2023-05-10 DIAGNOSIS — R1011 Right upper quadrant pain: Secondary | ICD-10-CM | POA: Diagnosis not present

## 2023-05-10 NOTE — Addendum Note (Signed)
Addended by: Bennie Pierini on: 05/10/2023 02:38 PM   Modules accepted: Orders

## 2023-05-12 ENCOUNTER — Other Ambulatory Visit: Payer: Self-pay | Admitting: Nurse Practitioner

## 2023-05-12 DIAGNOSIS — R7989 Other specified abnormal findings of blood chemistry: Secondary | ICD-10-CM

## 2023-05-12 NOTE — Progress Notes (Signed)
Patient notified and verbalized understanding. Future orders placed

## 2023-05-12 NOTE — Progress Notes (Signed)
Called to notify patient and phone cut off. Attempted to call back and got voicemail. Voicemail was full and unable to leave message

## 2023-05-13 ENCOUNTER — Other Ambulatory Visit: Payer: Medicare HMO

## 2023-05-13 DIAGNOSIS — R7989 Other specified abnormal findings of blood chemistry: Secondary | ICD-10-CM | POA: Diagnosis not present

## 2023-05-14 LAB — HEPATIC FUNCTION PANEL
ALT: 20 [IU]/L (ref 0–32)
AST: 26 [IU]/L (ref 0–40)
Albumin: 4.4 g/dL (ref 3.8–4.8)
Alkaline Phosphatase: 65 [IU]/L (ref 44–121)
Bilirubin Total: 0.4 mg/dL (ref 0.0–1.2)
Bilirubin, Direct: 0.18 mg/dL (ref 0.00–0.40)
Total Protein: 6.6 g/dL (ref 6.0–8.5)

## 2023-05-31 ENCOUNTER — Other Ambulatory Visit: Payer: Self-pay

## 2023-05-31 ENCOUNTER — Encounter: Payer: Self-pay | Admitting: Surgery

## 2023-05-31 ENCOUNTER — Ambulatory Visit: Payer: Medicare HMO | Admitting: Surgery

## 2023-05-31 VITALS — BP 144/89 | HR 80 | Temp 98.3°F | Resp 18 | Ht 63.0 in | Wt 123.0 lb

## 2023-05-31 DIAGNOSIS — K8020 Calculus of gallbladder without cholecystitis without obstruction: Secondary | ICD-10-CM | POA: Diagnosis not present

## 2023-05-31 NOTE — Progress Notes (Addendum)
[facility] History and Physical  Reason for Referral: Cholelithiasis Referring Physician: Bennie Pierini, FNP  Chief Complaint   New Patient (Initial Visit)     Courtney Johnson is a 77 y.o. female.  HPI: Patient presents for evaluation of cholelithiasis.  She has a several year history of epigastric/chest burning sensation.  She has also had increased flatus and belching associated with this burning sensation.  She also intermittently will complain of upper abdominal pain, but she describes it as "soreness."  Her abdominal soreness is on and off and she has not noted that it is associated with anything in particular, including food.  She will intermittently get nausea but denies ever having any episodes of vomiting.  She has previously seen GI and last had an EGD 3 years ago, and she believes there might have been a little bit of inflammation in her stomach.  Her past medical history significant for hyperlipidemia and GERD.  She denies use of blood thinning medications.  Her surgical history significant for partial vaginal hysterectomy.  She denies use of tobacco products, alcohol, and illicit drugs.  Past Medical History:  Diagnosis Date   Allergy    Anxiety    not much sleep / stress    Arthritis    Cataract    GERD (gastroesophageal reflux disease)    Hyperlipidemia    Hypertension    elevated at Dr Isidore Moos = not high at home    Osteoporosis     Past Surgical History:  Procedure Laterality Date   ABDOMINAL HYSTERECTOMY     partial    COLONOSCOPY     EYE SURGERY     cataracts removed - bilateral     Family History  Problem Relation Age of Onset   Other Mother        age and frailty    Heart disease Father        by-pass   Heart attack Brother        53    Heart disease Brother    Diabetes Daughter    Colon cancer Sister    Cancer Sister        brain tumor - 51   Heart attack Son        at age 35   Esophageal cancer Neg Hx    Rectal  cancer Neg Hx    Stomach cancer Neg Hx    Liver cancer Neg Hx     Social History   Tobacco Use   Smoking status: Never   Smokeless tobacco: Never  Vaping Use   Vaping status: Never Used  Substance Use Topics   Alcohol use: No   Drug use: No    Medications: I have reviewed the patient's current medications. Allergies as of 05/31/2023   No Known Allergies      Medication List        Accurate as of May 31, 2023  1:06 PM. If you have any questions, ask your nurse or doctor.          STOP taking these medications    doxycycline 100 MG capsule Commonly known as: VIBRAMYCIN Stopped by: Lenaya Pietsch A Lurae Hornbrook       TAKE these medications    ALPRAZolam 0.25 MG tablet Commonly known as: XANAX Take 1 tablet (0.25 mg total) by mouth 2 (two) times daily as needed for anxiety.   pantoprazole 40 MG tablet Commonly known as: PROTONIX Take 1 tablet (40 mg total) by mouth daily.  phenylephrine 0.25 % suppository Commonly known as: (USE for PREPARATION-H) Place 1 suppository rectally as needed for hemorrhoids.   Restasis 0.05 % ophthalmic emulsion Generic drug: cycloSPORINE Place 1 drop into both eyes 2 (two) times daily.   simvastatin 40 MG tablet Commonly known as: ZOCOR Take 1 tablet (40 mg total) by mouth daily.   Vitamin C 500 MG Caps Take by mouth.   Vitamin D 50 MCG (2000 UT) tablet Take 2,000 Units by mouth daily.         ROS:  Constitutional: negative for chills, fatigue, and fevers Eyes: negative for visual disturbance and pain Ears, nose, mouth, throat, and face: negative for ear drainage, sore throat, and sinus problems Respiratory: negative for cough, wheezing, and shortness of breath Cardiovascular: negative for chest pain and palpitations Gastrointestinal: negative for abdominal pain, nausea, reflux symptoms, and vomiting Genitourinary:negative for dysuria and frequency Integument/breast: negative for dryness and  rash Hematologic/lymphatic: negative for bleeding and lymphadenopathy Musculoskeletal:negative for back pain and neck pain Neurological: negative for dizziness and tremors Endocrine: negative for temperature intolerance  Blood pressure (!) 144/89, pulse 80, temperature 98.3 F (36.8 C), temperature source Oral, resp. rate 18, height 5\' 3"  (1.6 m), weight 123 lb (55.8 kg), SpO2 97%. Physical Exam Vitals reviewed.  Constitutional:      Appearance: Normal appearance.  HENT:     Head: Normocephalic and atraumatic.  Eyes:     Extraocular Movements: Extraocular movements intact.     Pupils: Pupils are equal, round, and reactive to light.  Cardiovascular:     Rate and Rhythm: Normal rate and regular rhythm.  Pulmonary:     Effort: Pulmonary effort is normal.     Breath sounds: Normal breath sounds.  Abdominal:     Comments: Abdomen soft, nondistended, no percussion tenderness, mild upper abdominal epigastric and right upper quadrant tenderness to palpation; no rigidity, guarding, rebound tenderness; negative Murphy sign  Musculoskeletal:        General: Normal range of motion.     Cervical back: Normal range of motion.  Skin:    General: Skin is warm and dry.  Neurological:     General: No focal deficit present.     Mental Status: She is alert and oriented to person, place, and time.  Psychiatric:        Mood and Affect: Mood normal.        Behavior: Behavior normal.     Results: Abdominal ultrasound (05/10/2023): IMPRESSION: 1. Cholelithiasis without secondary signs of acute cholecystitis. 2. Common bile duct dilated, correlate with LFTs. If there are signs of obstruction, recommend MRI/MRCP.     Latest Ref Rng & Units 05/13/2023    8:49 AM 03/01/2023   10:11 AM 08/30/2022   11:19 AM  Hepatic Function  Total Protein 6.0 - 8.5 g/dL 6.6  6.8  6.6   Albumin 3.8 - 4.8 g/dL 4.4  4.4  4.2   AST 0 - 40 IU/L 26  26  29    ALT 0 - 32 IU/L 20  26  28    Alk Phosphatase 44 - 121  IU/L 65  69  66   Total Bilirubin 0.0 - 1.2 mg/dL 0.4  0.4  0.5   Bilirubin, Direct 0.00 - 0.40 mg/dL 6.60        Assessment & Plan:  Courtney Johnson is a 77 y.o. female who presents with cholelithiasis.  Imaging and blood work evaluated by myself.  -I discussed the pathophysiology of gallbladder disease and why we  recommend surgical excision.  I explained to the patient that while some of her symptoms may be related to her gallbladder, the majority of her symptoms seem like they may be more related to reflux, especially the epigastric and chest burning.  We discussed her imaging, and while her CBD was very mildly dilated at 9 mm (upper limit of normal for someone in her age range being 8 mm), given her normal LFTs, I do not feel that she currently has choledocholithiasis or passed a stone recently -Given her symptoms, I recommend that she follow-up with her previous GI physician to evaluate to see if she needs a repeat EGD -I counseled the patient about the indication, risks and benefits of robotic laparoscopic cholecystectomy.  She understands there is a very small chance for bleeding, infection, injury to normal structures (including common bile duct), conversion to open surgery, persistent symptoms, evolution of postcholecystectomy diarrhea, need for secondary interventions, anesthesia reaction, cardiopulmonary issues and other risks not specifically detailed here. I described the expected recovery, the plan for follow-up and the restrictions during the recovery phase.  All questions were answered. -After discussing her symptoms and cholecystectomy, patient would like to hold off on surgery at this time and be evaluated by GI for other possible causes of her symptoms -Advised that she should try to maintain a low-fat diet because if her symptoms improved with a low-fat diet, then her symptoms may be more related to gallbladder pathology -Information provided to the patient regarding  cholelithiasis, cholecystitis, cholecystectomy, and low-fat diet -Advised that she needs to present to the emergency department if she begins to have fevers, chills, worsening right upper quadrant abdominal pain, nausea, vomiting, and jaundice -Follow up with me as needed  All questions were answered to the satisfaction of the patient.  Theophilus Kinds, DO [facility] [address] [place], [place] [address] [phone] (office)

## 2023-06-10 DIAGNOSIS — Z1231 Encounter for screening mammogram for malignant neoplasm of breast: Secondary | ICD-10-CM | POA: Diagnosis not present

## 2023-07-11 ENCOUNTER — Encounter: Payer: Self-pay | Admitting: *Deleted

## 2023-07-27 DIAGNOSIS — H353121 Nonexudative age-related macular degeneration, left eye, early dry stage: Secondary | ICD-10-CM | POA: Diagnosis not present

## 2023-08-04 ENCOUNTER — Encounter: Payer: Self-pay | Admitting: Nurse Practitioner

## 2023-08-04 ENCOUNTER — Ambulatory Visit (INDEPENDENT_AMBULATORY_CARE_PROVIDER_SITE_OTHER): Payer: HMO | Admitting: Nurse Practitioner

## 2023-08-04 VITALS — BP 131/80 | HR 79 | Temp 98.3°F | Ht 63.0 in | Wt 126.0 lb

## 2023-08-04 DIAGNOSIS — R338 Other retention of urine: Secondary | ICD-10-CM | POA: Diagnosis not present

## 2023-08-04 DIAGNOSIS — R300 Dysuria: Secondary | ICD-10-CM | POA: Diagnosis not present

## 2023-08-04 DIAGNOSIS — R3989 Other symptoms and signs involving the genitourinary system: Secondary | ICD-10-CM | POA: Insufficient documentation

## 2023-08-04 LAB — URINALYSIS, ROUTINE W REFLEX MICROSCOPIC
Bilirubin, UA: NEGATIVE
Glucose, UA: NEGATIVE
Ketones, UA: NEGATIVE
Nitrite, UA: NEGATIVE
Protein,UA: NEGATIVE
RBC, UA: NEGATIVE
Specific Gravity, UA: <1.005 — ABNORMAL LOW (ref 1.005–1.030)
Urobilinogen, Ur: 0.2 mg/dL (ref 0.2–1.0)
pH, UA: 6 (ref 5.0–7.5)

## 2023-08-04 LAB — MICROSCOPIC EXAMINATION
RBC, Urine: NONE SEEN /[HPF] (ref 0–2)
Renal Epithel, UA: NONE SEEN /[HPF]
Yeast, UA: NONE SEEN

## 2023-08-04 MED ORDER — PHENAZOPYRIDINE HCL 100 MG PO TABS: 100.0000 mg | ORAL_TABLET | Freq: Three times a day (TID) | ORAL | 0 refills | Status: DC | PRN

## 2023-08-04 NOTE — Progress Notes (Signed)
Acute Office Visit  Subjective:     Patient ID: Courtney Johnson, female    DOB: [birth year], 78 y.o.   MRN: [id number]  Chief Complaint  Patient presents with   Dysuria    Started at the beginning of the week     HPI  Courtney Johnson is a 78 y.o. female present August 03, 2022 for an acute visit.   complains of urinary frequency, urgency, pressure and dysuria x 4 days days "can go but feel like I have to go".  She denies  flank pain, fever, chills, or abnormal vaginal discharge or bleeding.  She is unable to give [place] A1c but she is alone drinking her water.  If not able to provide a sample she agreed to be straight cath for specimen collection   Active Ambulatory Problems    Diagnosis Date Noted   Family history of colon cancer - sister - late 13's 04/26/2013   Hyperlipidemia 06/23/2013   Generalized anxiety disorder 06/23/2013   GERD (gastroesophageal reflux disease) 04/10/2014   Age-related osteoporosis without current pathological fracture 01/15/2019   Acute cystitis without hematuria 05/04/2023   Flank pain 05/04/2023   Acute urinary retention 08/04/2023   Sensation of pressure in bladder area 08/04/2023   Dysuria 08/04/2023   Resolved Ambulatory Problems    Diagnosis Date Noted   RECTAL BLEEDING 10/23/2007   Abdominal bloating with cramps 04/10/2014   Rectal discomfort 04/10/2014   Osteopenia 04/24/2014   Environmental and seasonal allergies 04/08/2016   Abdominal pain, epigastric 04/19/2019   Screening for viral disease 04/19/2019   Past Medical History:  Diagnosis Date   Allergy    Anxiety    Arthritis    Cataract    Hypertension    Osteoporosis     Review of Systems  Constitutional:  Negative for chills and fever.  HENT:  Negative for congestion and sore throat.   Respiratory:  Negative for cough and shortness of breath.   Cardiovascular:  Negative for chest pain and leg swelling.  Gastrointestinal:  Negative for nausea and vomiting.   Genitourinary:  Positive for dysuria and urgency. Negative for frequency.       "Feeling of pressure not abe to go"  Musculoskeletal:  Positive for back pain.  Neurological:  Negative for dizziness and headaches.   Negative unless indicated in HPI    Objective:    BP 131/80   Pulse 79   Temp 98.3 F (36.8 C)   Ht 5\' 3"  (1.6 m)   Wt 126 lb (57.2 kg)   SpO2 100%   BMI 22.32 kg/m  BP Readings from Last 3 Encounters:  08/04/23 131/80  05/31/23 (!) 144/89  05/04/23 125/81   Wt Readings from Last 3 Encounters:  08/04/23 126 lb (57.2 kg)  05/31/23 123 lb (55.8 kg)  05/04/23 124 lb 6.4 oz (56.4 kg)      Physical Exam Vitals and nursing note reviewed. Exam conducted with a chaperone present [facility] C[facility]).  Constitutional:      Appearance: Normal appearance. She is not ill-appearing.  HENT:     Head: Normocephalic and atraumatic.     Nose: Nose normal.     Mouth/Throat:     Mouth: Mucous membranes are moist.  Eyes:     General: No scleral icterus.    Extraocular Movements: Extraocular movements intact.     Conjunctiva/sclera: Conjunctivae normal.     Pupils: Pupils are equal, round, and reactive to light.  Cardiovascular:  Heart sounds: Normal heart sounds.  Pulmonary:     Effort: Pulmonary effort is normal.     Breath sounds: Normal breath sounds.  Abdominal:     General: Bowel sounds are normal.     Tenderness: There is abdominal tenderness.  Genitourinary:    Exam position: Supine.     Comments: She was straight cath with a 600 mL urine output Musculoskeletal:     Right lower leg: No edema.     Left lower leg: No edema.  Skin:    General: Skin is warm and dry.     Findings: No rash.  Neurological:     Mental Status: She is alert and oriented to person, place, and time. Mental status is at baseline.  Psychiatric:        Mood and Affect: Mood normal.        Behavior: Behavior normal.        Thought Content: Thought content normal.        Judgment:  Judgment normal.      Urine dipstick shows positive for leukocytes.  Micro exam: 0-5 WBC's per HPF, few + bacteria, and epithelial cell 0-10.    No results found for any visits on 08/04/23.      Assessment & Plan:  Dysuria -     Urinalysis, Routine w reflex microscopic -     Urine Culture -     Phenazopyridine HCl; Take 1 tablet (100 mg total) by mouth 3 (three) times daily as needed for pain.  Dispense: 10 tablet; Refill: 0  Acute urinary retention -     Phenazopyridine HCl; Take 1 tablet (100 mg total) by mouth 3 (three) times daily as needed for pain.  Dispense: 10 tablet; Refill: 0  Sensation of pressure in bladder area  Courtney Johnson is a 78 year old Caucasian female seen today for dysuria, no acute distress  Urinary retention:She was straight cath with 600 ml output Pyridium for dysuria Awaiting urine culture result to determine the need for antibiotic therapy May need a referral to urology is symptoms urinary retention persist . Call or return to clinic prn if these symptoms worsen or fail to improve as anticipated  Encourage  healthy lifestyle choices, including diet (rich in fruits, vegetables, and lean proteins, and low in salt and simple carbohydrates) and exercise (at least 30 minutes of moderate physical activity daily).     The above assessment and management plan was discussed with the patient. The patient verbalized understanding of and has agreed to the management plan. Patient is aware to call the clinic if they develop any new symptoms or if symptoms persist or worsen. Patient is aware when to return to the clinic for a follow-up visit. Patient educated on when it is appropriate to go to the emergency department.  Return in 2 weeks (on 08/18/2023), or if symptoms worsen or fail to improve.  Arrie Aran Santa Lighter, [place] [facility] [address] [place], [place] [address] [phone]  Note: This document was prepared by [employer]ubin Milan voice  dictation technology and any errors that results from this process are unintentional.

## 2023-08-06 LAB — URINE CULTURE

## 2023-08-23 ENCOUNTER — Ambulatory Visit (INDEPENDENT_AMBULATORY_CARE_PROVIDER_SITE_OTHER): Payer: Medicare HMO | Admitting: Nurse Practitioner

## 2023-08-23 ENCOUNTER — Encounter: Payer: Self-pay | Admitting: Nurse Practitioner

## 2023-08-23 VITALS — BP 134/87 | HR 88 | Temp 96.9°F | Ht 63.0 in | Wt 121.0 lb

## 2023-08-23 DIAGNOSIS — M810 Age-related osteoporosis without current pathological fracture: Secondary | ICD-10-CM

## 2023-08-23 DIAGNOSIS — E782 Mixed hyperlipidemia: Secondary | ICD-10-CM | POA: Diagnosis not present

## 2023-08-23 DIAGNOSIS — F411 Generalized anxiety disorder: Secondary | ICD-10-CM | POA: Diagnosis not present

## 2023-08-23 DIAGNOSIS — K219 Gastro-esophageal reflux disease without esophagitis: Secondary | ICD-10-CM

## 2023-08-23 MED ORDER — PANTOPRAZOLE SODIUM 40 MG PO TBEC: 40.0000 mg | DELAYED_RELEASE_TABLET | Freq: Every day | ORAL | 1 refills | Status: DC

## 2023-08-23 MED ORDER — SIMVASTATIN 40 MG PO TABS: 40.0000 mg | ORAL_TABLET | Freq: Every day | ORAL | 1 refills | Status: DC

## 2023-08-23 MED ORDER — ALPRAZOLAM 0.25 MG PO TABS: 0.2500 mg | ORAL_TABLET | Freq: Two times a day (BID) | ORAL | 5 refills | Status: DC | PRN

## 2023-08-23 NOTE — Patient Instructions (Signed)

## 2023-08-23 NOTE — Progress Notes (Signed)
 Subjective:    Patient ID: Courtney Johnson, female    DOB: 24-Oct-1945, 78 y.o.   MRN: [id number]   Chief Complaint: medical management of chronic issues     HPI:  Courtney Johnson is a 78 y.o. who identifies as a female who was assigned female at birth.   Social history: Lives with: husband Work history: retired   Water engineer in today for follow up of the following chronic medical issues:  1. Mixed hyperlipidemia Does watch diet but does no dedicated exercise. Lab Results  Component Value Date   CHOL 200 (H) 03/01/2023   HDL 66 03/01/2023   LDLCALC 117 (H) 03/01/2023   TRIG 97 03/01/2023   CHOLHDL 3.0 03/01/2023     2. Gastroesophageal reflux disease, unspecified whether esophagitis present Is on protonix daily and is doing well  3. Generalized anxiety disorder Is on xanax daily and is doing well.    03/01/2023    9:41 AM 08/30/2022   10:42 AM 07/02/2022    9:18 AM 04/29/2022   11:30 AM  GAD 7 : Generalized Anxiety Score  Nervous, Anxious, on Edge 0 1 0 0  Control/stop worrying 0 0 0 0  Worry too much - different things 1 1 0 0  Trouble relaxing 0 0 0 0  Restless 0 0 0 0  Easily annoyed or irritable 0 0 0 0  Afraid - awful might happen 0 0 0 0  Total GAD 7 Score 1 2 0 0  Anxiety Difficulty Not difficult at all Not difficult at all Not difficult at all Not difficult at all        4. Age-related osteoporosis without current pathological fracture Last dexascan was done on 03/02/23.  T score was -1.7.  Does no weight bearing exercises.   New complaints:  No Known Allergies Outpatient Encounter Medications as of 08/23/2023  Medication Sig   ALPRAZolam (XANAX) 0.25 MG tablet Take 1 tablet (0.25 mg total) by mouth 2 (two) times daily as needed for anxiety.   Ascorbic Acid (VITAMIN C) 500 MG CAPS Take by mouth.   Cholecalciferol (VITAMIN D) 50 MCG (2000 UT) tablet Take 2,000 Units by mouth daily.   pantoprazole (PROTONIX) 40 MG tablet Take 1 tablet (40 mg total)  by mouth daily.   phenazopyridine (PYRIDIUM) 100 MG tablet Take 1 tablet (100 mg total) by mouth 3 (three) times daily as needed for pain.   phenylephrine (,USE FOR PREPARATION-H,) 0.25 % suppository Place 1 suppository rectally as needed for hemorrhoids.   RESTASIS 0.05 % ophthalmic emulsion Place 1 drop into both eyes 2 (two) times daily.   simvastatin (ZOCOR) 40 MG tablet Take 1 tablet (40 mg total) by mouth daily.   No facility-administered encounter medications on file as of 08/23/2023.    Past Surgical History:  Procedure Laterality Date   ABDOMINAL HYSTERECTOMY     partial    COLONOSCOPY     EYE SURGERY     cataracts removed - bilateral     Family History  Problem Relation Age of Onset   Other Mother        age and frailty    Heart disease Father        by-pass   Heart attack Brother        63    Heart disease Brother    Diabetes Daughter    Colon cancer Sister    Cancer Sister        brain tumor - 40  Heart attack Son        at age 71   Esophageal cancer Neg Hx    Rectal cancer Neg Hx    Stomach cancer Neg Hx    Liver cancer Neg Hx       Controlled substance contract: n/a     Review of Systems  Constitutional:  Negative for diaphoresis.  Eyes:  Negative for pain.  Respiratory:  Negative for shortness of breath.   Cardiovascular:  Negative for chest pain, palpitations and leg swelling.  Gastrointestinal:  Negative for abdominal pain.  Endocrine: Negative for polydipsia.  Skin:  Negative for rash.  Neurological:  Negative for dizziness, weakness and headaches.  Hematological:  Does not bruise/bleed easily.  All other systems reviewed and are negative.      Objective:   Physical Exam Vitals and nursing note reviewed.  Constitutional:      General: She is not in acute distress.    Appearance: Normal appearance. She is well-developed.  HENT:     Head: Normocephalic.     Right Ear: Tympanic membrane normal.     Left Ear: Tympanic membrane normal.      Nose: Nose normal.     Mouth/Throat:     Mouth: Mucous membranes are moist.  Eyes:     Pupils: Pupils are equal, round, and reactive to light.  Neck:     Vascular: No carotid bruit or JVD.  Cardiovascular:     Rate and Rhythm: Normal rate and regular rhythm.     Heart sounds: Normal heart sounds.  Pulmonary:     Effort: Pulmonary effort is normal. No respiratory distress.     Breath sounds: Normal breath sounds. No wheezing or rales.  Chest:     Chest wall: No tenderness.  Abdominal:     General: Bowel sounds are normal. There is no distension or abdominal bruit.     Palpations: Abdomen is soft. There is no hepatomegaly, splenomegaly, mass or pulsatile mass.     Tenderness: There is no abdominal tenderness.  Musculoskeletal:        General: Normal range of motion.     Cervical back: Normal range of motion and neck supple.  Lymphadenopathy:     Cervical: No cervical adenopathy.  Skin:    General: Skin is warm and dry.  Neurological:     Mental Status: She is alert and oriented to person, place, and time.     Deep Tendon Reflexes: Reflexes are normal and symmetric.  Psychiatric:        Behavior: Behavior normal.        Thought Content: Thought content normal.        Judgment: Judgment normal.    There were no vitals taken for this visit.        Assessment & Plan:   Courtney Johnson comes in today with chief complaint of No chief complaint on file.   Diagnosis and orders addressed:  1. Mixed hyperlipidemia Low fat diet - simvastatin (ZOCOR) 40 MG tablet; Take 1 tablet (40 mg total) by mouth daily.  Dispense: 90 tablet; Refill: 1 - CBC with Differential/Platelet - CMP14+EGFR - Lipid panel  2. Gastroesophageal reflux disease, unspecified whether esophagitis present Avoid spicy foods Do not eat 2 hours prior to bedtime  - pantoprazole (PROTONIX) 40 MG tablet; Take 1 tablet (40 mg total) by mouth daily.  Dispense: 90 tablet; Refill: 1  3. Generalized  anxiety disorder Stress management - ALPRAZolam (XANAX) 0.25 MG tablet; Take 1  tablet (0.25 mg total) by mouth 2 (two) times daily as needed for anxiety.  Dispense: 60 tablet; Refill: 2  4. Age-related osteoporosis without current pathological fracture Weight bearing exercise - DG [facility] DEXA   Labs pending Health Maintenance reviewed Diet and exercise encouraged  Follow up plan: 6 months   [name], FNP

## 2023-08-23 NOTE — Addendum Note (Signed)
 Addended by: Bennie Pierini on: 08/23/2023 10:50 AM   Modules accepted: Orders

## 2023-08-24 LAB — LIPID PANEL
Chol/HDL Ratio: 2.7 ratio (ref 0.0–4.4)
Cholesterol, Total: 161 mg/dL (ref 100–199)
HDL: 60 mg/dL (ref >39)
LDL Chol Calc (NIH): 79 mg/dL (ref 0–99)
Triglycerides: 127 mg/dL (ref 0–149)
VLDL Cholesterol Cal: 22 mg/dL (ref 5–40)

## 2023-08-24 LAB — CMP14+EGFR
ALT: 27 IU/L (ref 0–32)
AST: 32 IU/L (ref 0–40)
Albumin: 4.5 g/dL (ref 3.8–4.8)
Alkaline Phosphatase: 64 IU/L (ref 44–121)
BUN/Creatinine Ratio: 10 — ABNORMAL LOW (ref 12–28)
BUN: 8 mg/dL (ref 8–27)
Bilirubin Total: 0.4 mg/dL (ref 0.0–1.2)
CO2: 24 mmol/L (ref 20–29)
Calcium: 9.7 mg/dL (ref 8.7–10.3)
Chloride: 106 mmol/L (ref 96–106)
Creatinine, Ser: 0.79 mg/dL (ref 0.57–1.00)
Globulin, Total: 2.3 g/dL (ref 1.5–4.5)
Glucose: 92 mg/dL (ref 70–99)
Potassium: 4.2 mmol/L (ref 3.5–5.2)
Sodium: 145 mmol/L — ABNORMAL HIGH (ref 134–144)
Total Protein: 6.8 g/dL (ref 6.0–8.5)
eGFR: 77 mL/min/{1.73_m2} (ref >59)

## 2023-08-24 LAB — CBC WITH DIFFERENTIAL/PLATELET
Basophils Absolute: 0.1 10*3/uL (ref 0.0–0.2)
Basos: 1 %
EOS (ABSOLUTE): 0.1 10*3/uL (ref 0.0–0.4)
Eos: 2 %
Hematocrit: 42.1 % (ref 34.0–46.6)
Hemoglobin: 14 g/dL (ref 11.1–15.9)
Immature Grans (Abs): 0 10*3/uL (ref 0.0–0.1)
Immature Granulocytes: 0 %
Lymphocytes Absolute: 1.6 10*3/uL (ref 0.7–3.1)
Lymphs: 30 %
MCH: 31.6 pg (ref 26.6–33.0)
MCHC: 33.3 g/dL (ref 31.5–35.7)
MCV: 95 fL (ref 79–97)
Monocytes Absolute: 0.3 10*3/uL (ref 0.1–0.9)
Monocytes: 6 %
Neutrophils Absolute: 3.1 10*3/uL (ref 1.4–7.0)
Neutrophils: 61 %
Platelets: 280 10*3/uL (ref 150–450)
RBC: 4.43 x10E6/uL (ref 3.77–5.28)
RDW: 12.5 % (ref 11.7–15.4)
WBC: 5.2 10*3/uL (ref 3.4–10.8)

## 2023-09-26 DIAGNOSIS — E785 Hyperlipidemia, unspecified: Secondary | ICD-10-CM | POA: Diagnosis not present

## 2023-09-26 DIAGNOSIS — I10 Essential (primary) hypertension: Secondary | ICD-10-CM | POA: Diagnosis not present

## 2023-09-26 DIAGNOSIS — F419 Anxiety disorder, unspecified: Secondary | ICD-10-CM | POA: Diagnosis not present

## 2023-09-26 DIAGNOSIS — K219 Gastro-esophageal reflux disease without esophagitis: Secondary | ICD-10-CM | POA: Diagnosis not present

## 2023-11-17 DIAGNOSIS — H04121 Dry eye syndrome of right lacrimal gland: Secondary | ICD-10-CM | POA: Diagnosis not present

## 2023-12-08 DIAGNOSIS — H04123 Dry eye syndrome of bilateral lacrimal glands: Secondary | ICD-10-CM | POA: Diagnosis not present

## 2024-02-02 DIAGNOSIS — H35031 Hypertensive retinopathy, right eye: Secondary | ICD-10-CM | POA: Diagnosis not present

## 2024-02-23 ENCOUNTER — Encounter: Payer: Self-pay | Admitting: Nurse Practitioner

## 2024-02-23 ENCOUNTER — Ambulatory Visit: Admitting: Nurse Practitioner

## 2024-02-23 VITALS — BP 134/72 | HR 81 | Temp 97.3°F | Ht 63.0 in | Wt 121.0 lb

## 2024-02-23 DIAGNOSIS — K219 Gastro-esophageal reflux disease without esophagitis: Secondary | ICD-10-CM

## 2024-02-23 DIAGNOSIS — Z0000 Encounter for general adult medical examination without abnormal findings: Secondary | ICD-10-CM | POA: Diagnosis not present

## 2024-02-23 DIAGNOSIS — M810 Age-related osteoporosis without current pathological fracture: Secondary | ICD-10-CM

## 2024-02-23 DIAGNOSIS — E782 Mixed hyperlipidemia: Secondary | ICD-10-CM

## 2024-02-23 DIAGNOSIS — F411 Generalized anxiety disorder: Secondary | ICD-10-CM

## 2024-02-23 DIAGNOSIS — Z0001 Encounter for general adult medical examination with abnormal findings: Secondary | ICD-10-CM | POA: Diagnosis not present

## 2024-02-23 MED ORDER — PANTOPRAZOLE SODIUM 40 MG PO TBEC: 40.0000 mg | DELAYED_RELEASE_TABLET | Freq: Every day | ORAL | 1 refills | Status: AC

## 2024-02-23 MED ORDER — ALPRAZOLAM 0.25 MG PO TABS: 0.2500 mg | ORAL_TABLET | Freq: Two times a day (BID) | ORAL | 5 refills | Status: AC | PRN

## 2024-02-23 MED ORDER — SIMVASTATIN 40 MG PO TABS
40.0000 mg | ORAL_TABLET | Freq: Every day | ORAL | 1 refills | Status: AC
Start: 2024-02-23 — End: ?

## 2024-02-23 NOTE — Progress Notes (Signed)
 Subjective:    Patient ID: Courtney Johnson, female    DOB: [birth year], 78 y.o.   MRN: [id number]   Chief Complaint: .  HPI  Chief Complaint: annual physical    HPI:  Courtney Johnson is a 78 y.o. who identifies as a female who was assigned female at birth.   Social history: Lives with: husband Work history: retired   Water engineer in today for follow up of the following chronic medical issues:  1. Annual physical exam  2. Mixed hyperlipidemia Does try to watch diet but does very little exercise. Lab Results  Component Value Date   CHOL 161 08/23/2023   HDL 60 08/23/2023   LDLCALC 79 08/23/2023   TRIG 127 08/23/2023   CHOLHDL 2.7 08/23/2023     3. Gastroesophageal reflux disease, unspecified whether esophagitis present Is on protonix  daily and is doing well.  4. Generalized anxiety disorder Is on xanax  and is dong well.     03/01/2023    9:41 AM 08/30/2022   10:42 AM 07/02/2022    9:18 AM 04/29/2022   11:30 AM  GAD 7 : Generalized Anxiety Score  Nervous, Anxious, on Edge 0 1 0 0  Control/stop worrying 0 0 0 0  Worry too much - different things 1 1 0 0  Trouble relaxing 0 0 0 0  Restless 0 0 0 0  Easily annoyed or irritable 0 0 0 0  Afraid - awful might happen 0 0 0 0  Total GAD 7 Score 1 2 0 0  Anxiety Difficulty Not difficult at all Not difficult at all Not difficult at all Not difficult at all      5. Age-related osteoporosis without current pathological fracture Last dexascan was done in 03/02/23. Her t score was -1.7   New complaints: Nail fungus right great toenail- has been using vicks vapor rub and has helped  No Known Allergies Outpatient Encounter Medications as of 02/23/2024  Medication Sig   ALPRAZolam  (XANAX ) 0.25 MG tablet Take 1 tablet (0.25 mg total) by mouth 2 (two) times daily as needed for anxiety.   Ascorbic Acid (VITAMIN C) 500 MG CAPS Take by mouth.   Cholecalciferol (VITAMIN D ) 50 MCG (2000 UT) tablet Take 2,000 Units by mouth  daily.   pantoprazole  (PROTONIX ) 40 MG tablet Take 1 tablet (40 mg total) by mouth daily.   phenylephrine  (,USE FOR PREPARATION-H,) 0.25 % suppository Place 1 suppository rectally as needed for hemorrhoids. (Patient not taking: Reported on 08/23/2023)   RESTASIS 0.05 % ophthalmic emulsion Place 1 drop into both eyes 2 (two) times daily.   simvastatin  (ZOCOR ) 40 MG tablet Take 1 tablet (40 mg total) by mouth daily.   No facility-administered encounter medications on file as of 02/23/2024.    Past Surgical History:  Procedure Laterality Date   ABDOMINAL HYSTERECTOMY     partial    COLONOSCOPY     EYE SURGERY     cataracts removed - bilateral     Family History  Problem Relation Age of Onset   Other Mother        age and frailty    Heart disease Father        by-pass   Heart attack Brother        78    Heart disease Brother    Diabetes Daughter    Colon cancer Sister    Cancer Sister        brain tumor - 51   Heart attack Son  at age 52   Esophageal cancer Neg Hx    Rectal cancer Neg Hx    Stomach cancer Neg Hx    Liver cancer Neg Hx       Controlled substance contract: 08/1122- drug screen today   Patient Active Problem List   Diagnosis Date Noted   Age-related osteoporosis without current pathological fracture 01/15/2019   GERD (gastroesophageal reflux disease) 04/10/2014   Hyperlipidemia 06/23/2013   Generalized anxiety disorder 06/23/2013   Family history of colon cancer - sister - late 50's 04/26/2013       Review of Systems  Constitutional:  Negative for diaphoresis.  Eyes:  Negative for pain.  Respiratory:  Negative for shortness of breath.   Cardiovascular:  Negative for chest pain, palpitations and leg swelling.  Gastrointestinal:  Negative for abdominal pain.  Endocrine: Negative for polydipsia.  Skin:  Negative for rash.  Neurological:  Negative for dizziness, weakness and headaches.  Hematological:  Does not bruise/bleed easily.  All other  systems reviewed and are negative.      Objective:   Physical Exam Vitals and nursing note reviewed.  Constitutional:      General: She is not in acute distress.    Appearance: Normal appearance. She is well-developed.  HENT:     Head: Normocephalic.     Right Ear: Tympanic membrane normal.     Left Ear: Tympanic membrane normal.     Nose: Nose normal.     Mouth/Throat:     Mouth: Mucous membranes are moist.  Eyes:     Pupils: Pupils are equal, round, and reactive to light.  Neck:     Vascular: No carotid bruit or JVD.  Cardiovascular:     Rate and Rhythm: Normal rate and regular rhythm.     Heart sounds: Normal heart sounds.  Pulmonary:     Effort: Pulmonary effort is normal. No respiratory distress.     Breath sounds: Normal breath sounds. No wheezing or rales.  Chest:     Chest wall: No tenderness.  Abdominal:     General: Bowel sounds are normal. There is no distension or abdominal bruit.     Palpations: Abdomen is soft. There is no hepatomegaly, splenomegaly, mass or pulsatile mass.     Tenderness: There is no abdominal tenderness.  Musculoskeletal:        General: Normal range of motion.     Cervical back: Normal range of motion and neck supple.  Lymphadenopathy:     Cervical: No cervical adenopathy.  Skin:    General: Skin is warm and dry.  Neurological:     Mental Status: She is alert and oriented to person, place, and time.     Deep Tendon Reflexes: Reflexes are normal and symmetric.  Psychiatric:        Behavior: Behavior normal.        Thought Content: Thought content normal.        Judgment: Judgment normal.    BP 134/72   Pulse 81   Temp (!) 97.3 F (36.3 C) (Temporal)   Ht 5' 3 (1.6 m)   Wt 121 lb (54.9 kg)   SpO2 98%   BMI 21.43 kg/m         Assessment & Plan:   Courtney Johnson comes in today with chief complaint of annual physical  Diagnosis and orders addressed:  1. Annual physical exam - DG Chest 2 View - EKG 12-Lead  2.  Mixed hyperlipidemia Low fat diet - DG  Chest 2 View - EKG 12-Lead - simvastatin  (ZOCOR ) 40 MG tablet; Take 1 tablet (40 mg total) by mouth daily.  Dispense: 90 tablet; Refill: 1  3. Gastroesophageal reflux disease, unspecified whether esophagitis present Avoid spicy foods Do not eat 2 hours prior to bedtime  - pantoprazole  (PROTONIX ) 40 MG tablet; Take 1 tablet (40 mg total) by mouth daily.  Dispense: 90 tablet; Refill: 1  4. Generalized anxiety disorder Stress management - ALPRAZolam  (XANAX ) 0.25 MG tablet; Take 1 tablet (0.25 mg total) by mouth 2 (two) times daily as needed for anxiety.  Dispense: 60 tablet; Refill: 2 - ToxASSURE Select 13 (MW), Urine  5. Age-related osteoporosis without current pathological fracture Weight bearing exercise  6. Nail fungus Continue vicks rub BID  Labs pending Health Maintenance reviewed Diet and exercise encouraged  Follow up plan: 6 months   Mary-Margaret Gladis, FNP

## 2024-02-23 NOTE — Patient Instructions (Signed)

## 2024-02-24 ENCOUNTER — Ambulatory Visit: Payer: Self-pay | Admitting: Nurse Practitioner

## 2024-02-29 LAB — DRUG SCREEN 10 W/CONF, SERUM
Amphetamines, IA: NEGATIVE ng/mL
Barbiturates, IA: NEGATIVE ug/mL
Benzodiazepines, IA: NEGATIVE ng/mL
Cocaine & Metabolite, IA: NEGATIVE ng/mL
Methadone, IA: NEGATIVE ng/mL
Opiates, IA: NEGATIVE ng/mL
Oxycodones, IA: NEGATIVE ng/mL
Phencyclidine, IA: NEGATIVE ng/mL
Propoxyphene, IA: NEGATIVE ng/mL
THC(Marijuana) Metabolite, IA: NEGATIVE ng/mL

## 2024-02-29 LAB — CBC WITH DIFFERENTIAL/PLATELET
Basophils Absolute: 0.1 x10E3/uL (ref 0.0–0.2)
Basos: 2 %
EOS (ABSOLUTE): 0.2 x10E3/uL (ref 0.0–0.4)
Eos: 5 %
Hematocrit: 42.9 % (ref 34.0–46.6)
Hemoglobin: 14.5 g/dL (ref 11.1–15.9)
Immature Grans (Abs): 0 x10E3/uL (ref 0.0–0.1)
Immature Granulocytes: 0 %
Lymphocytes Absolute: 1.3 x10E3/uL (ref 0.7–3.1)
Lymphs: 29 %
MCH: 31.5 pg (ref 26.6–33.0)
MCHC: 33.8 g/dL (ref 31.5–35.7)
MCV: 93 fL (ref 79–97)
Monocytes Absolute: 0.2 x10E3/uL (ref 0.1–0.9)
Monocytes: 5 %
Neutrophils Absolute: 2.6 x10E3/uL (ref 1.4–7.0)
Neutrophils: 59 %
Platelets: 299 x10E3/uL (ref 150–450)
RBC: 4.6 x10E6/uL (ref 3.77–5.28)
RDW: 12.7 % (ref 11.7–15.4)
WBC: 4.4 x10E3/uL (ref 3.4–10.8)

## 2024-02-29 LAB — CMP14+EGFR
ALT: 20 IU/L (ref 0–32)
AST: 26 IU/L (ref 0–40)
Albumin: 4.7 g/dL (ref 3.8–4.8)
Alkaline Phosphatase: 74 IU/L (ref 44–121)
BUN/Creatinine Ratio: 7 — ABNORMAL LOW (ref 12–28)
BUN: 6 mg/dL — ABNORMAL LOW (ref 8–27)
Bilirubin Total: 0.4 mg/dL (ref 0.0–1.2)
CO2: 21 mmol/L (ref 20–29)
Calcium: 9.8 mg/dL (ref 8.7–10.3)
Chloride: 106 mmol/L (ref 96–106)
Creatinine, Ser: 0.88 mg/dL (ref 0.57–1.00)
Globulin, Total: 2.3 g/dL (ref 1.5–4.5)
Glucose: 89 mg/dL (ref 70–99)
Potassium: 3.9 mmol/L (ref 3.5–5.2)
Sodium: 144 mmol/L (ref 134–144)
Total Protein: 7 g/dL (ref 6.0–8.5)
eGFR: 68 mL/min/1.73 (ref >59)

## 2024-02-29 LAB — LIPID PANEL
Chol/HDL Ratio: 4.9 ratio — ABNORMAL HIGH (ref 0.0–4.4)
Cholesterol, Total: 306 mg/dL — ABNORMAL HIGH (ref 100–199)
HDL: 62 mg/dL (ref >39)
LDL Chol Calc (NIH): 212 mg/dL — ABNORMAL HIGH (ref 0–99)
Triglycerides: 170 mg/dL — ABNORMAL HIGH (ref 0–149)
VLDL Cholesterol Cal: 32 mg/dL (ref 5–40)

## 2024-02-29 LAB — THYROID PANEL WITH TSH
Free Thyroxine Index: 1.9 (ref 1.2–4.9)
T3 Uptake Ratio: 21 % — ABNORMAL LOW (ref 24–39)
T4, Total: 8.9 ug/dL (ref 4.5–12.0)
TSH: 2.12 u[IU]/mL (ref 0.450–4.500)

## 2024-02-29 LAB — VITAMIN D 25 HYDROXY (VIT D DEFICIENCY, FRACTURES): Vit D, 25-Hydroxy: 50.6 ng/mL (ref 30.0–100.0)

## 2024-03-05 DIAGNOSIS — H04123 Dry eye syndrome of bilateral lacrimal glands: Secondary | ICD-10-CM | POA: Diagnosis not present

## 2024-04-09 ENCOUNTER — Telehealth: Payer: Self-pay | Admitting: Nurse Practitioner

## 2024-04-09 DIAGNOSIS — H938X3 Other specified disorders of ear, bilateral: Secondary | ICD-10-CM

## 2024-04-09 NOTE — Telephone Encounter (Signed)
Ok to go ahead with referral. 

## 2024-04-09 NOTE — Telephone Encounter (Signed)
 Copied from CRM #[id number]. Topic: Referral - Request for Referral >> Apr 09, 2024 10:10 AM Carrielelia G wrote: (declined speaking with NT)   Patient stated she has discussed her onging sinus issues and ear pain  with provider Gladis before. Patient feels as if she needs to speak and be seen by an ENT doctor regarding these ongoing issue.  Patient prefers a Patent examiner.   Suggested   [facility] [address] [place], [place] [address] [phone]

## 2024-05-23 ENCOUNTER — Institutional Professional Consult (permissible substitution) (INDEPENDENT_AMBULATORY_CARE_PROVIDER_SITE_OTHER): Admitting: Physician Assistant

## 2024-05-29 ENCOUNTER — Encounter (INDEPENDENT_AMBULATORY_CARE_PROVIDER_SITE_OTHER): Payer: Self-pay

## 2024-06-12 LAB — HM MAMMOGRAPHY

## 2024-06-27 ENCOUNTER — Encounter: Payer: Self-pay | Admitting: Nurse Practitioner

## 2024-07-20 ENCOUNTER — Ambulatory Visit: Payer: Self-pay | Admitting: Nurse Practitioner

## 2024-08-20 ENCOUNTER — Ambulatory Visit: Payer: Self-pay | Admitting: Nurse Practitioner
